# Patient Record
Sex: Female | Born: 1945 | Race: White | Hispanic: No | Smoking: Former smoker
Health system: Southern US, Community
[De-identification: ages and names within clinical notes are randomized; demographics above are authoritative.]

## PROBLEM LIST (undated history)

## (undated) DIAGNOSIS — I509 Heart failure, unspecified: Secondary | ICD-10-CM

## (undated) DIAGNOSIS — E0500 Thyrotoxicosis with diffuse goiter without thyrotoxic crisis or storm: Secondary | ICD-10-CM

## (undated) DIAGNOSIS — C801 Malignant (primary) neoplasm, unspecified: Secondary | ICD-10-CM

## (undated) DIAGNOSIS — M1990 Unspecified osteoarthritis, unspecified site: Secondary | ICD-10-CM

## (undated) DIAGNOSIS — K56609 Unspecified intestinal obstruction, unspecified as to partial versus complete obstruction: Secondary | ICD-10-CM

## (undated) DIAGNOSIS — K5790 Diverticulosis of intestine, part unspecified, without perforation or abscess without bleeding: Secondary | ICD-10-CM

## (undated) DIAGNOSIS — I779 Disorder of arteries and arterioles, unspecified: Secondary | ICD-10-CM

## (undated) DIAGNOSIS — E7800 Pure hypercholesterolemia, unspecified: Secondary | ICD-10-CM

## (undated) DIAGNOSIS — I340 Nonrheumatic mitral (valve) insufficiency: Secondary | ICD-10-CM

## (undated) DIAGNOSIS — I253 Aneurysm of heart: Secondary | ICD-10-CM

## (undated) DIAGNOSIS — E079 Disorder of thyroid, unspecified: Secondary | ICD-10-CM

## (undated) DIAGNOSIS — I2510 Atherosclerotic heart disease of native coronary artery without angina pectoris: Secondary | ICD-10-CM

## (undated) HISTORY — PX: BASAL CELL CARCINOMA EXCISION: SHX1214

## (undated) HISTORY — PX: TRIGGER FINGER RELEASE: SHX641

## (undated) HISTORY — DX: Unspecified intestinal obstruction, unspecified as to partial versus complete obstruction: K56.609

## (undated) HISTORY — PX: DILATION AND CURETTAGE OF UTERUS: SHX78

## (undated) HISTORY — DX: Diverticulosis of intestine, part unspecified, without perforation or abscess without bleeding: K57.90

## (undated) HISTORY — PX: CARPAL TUNNEL RELEASE: SHX101

## (undated) HISTORY — DX: Nonrheumatic mitral (valve) insufficiency: I34.0

## (undated) HISTORY — PX: TONSILLECTOMY: SUR1361

## (undated) HISTORY — DX: Aneurysm of heart: I25.3

## (undated) HISTORY — DX: Disorder of arteries and arterioles, unspecified: I77.9

## (undated) HISTORY — PX: SHOULDER ARTHROSCOPY W/ ROTATOR CUFF REPAIR: SHX2400

## (undated) HISTORY — PX: ANTERIOR CRUCIATE LIGAMENT REPAIR: SHX115

## (undated) HISTORY — PX: RIGHT OOPHORECTOMY: SHX2359

## (undated) HISTORY — DX: Heart failure, unspecified: I50.9

## (undated) HISTORY — PX: CARDIAC CATHETERIZATION: SHX172

## (undated) HISTORY — PX: CATARACT EXTRACTION W/ INTRAOCULAR LENS  IMPLANT, BILATERAL: SHX1307

---

## 1998-06-29 ENCOUNTER — Other Ambulatory Visit: Admission: RE | Admit: 1998-06-29 | Discharge: 1998-06-29 | Payer: Self-pay | Admitting: Gynecology

## 1999-08-20 ENCOUNTER — Encounter: Admission: RE | Admit: 1999-08-20 | Discharge: 1999-08-20 | Payer: Self-pay | Admitting: Gynecology

## 1999-08-20 ENCOUNTER — Other Ambulatory Visit: Admission: RE | Admit: 1999-08-20 | Discharge: 1999-08-20 | Payer: Self-pay | Admitting: Gynecology

## 1999-08-20 ENCOUNTER — Encounter: Payer: Self-pay | Admitting: Gynecology

## 2000-06-09 ENCOUNTER — Ambulatory Visit (HOSPITAL_BASED_OUTPATIENT_CLINIC_OR_DEPARTMENT_OTHER): Admission: RE | Admit: 2000-06-09 | Discharge: 2000-06-09 | Payer: Self-pay | Admitting: Orthopedic Surgery

## 2000-08-17 ENCOUNTER — Other Ambulatory Visit: Admission: RE | Admit: 2000-08-17 | Discharge: 2000-08-17 | Payer: Self-pay | Admitting: Gynecology

## 2001-06-03 ENCOUNTER — Other Ambulatory Visit: Admission: RE | Admit: 2001-06-03 | Discharge: 2001-06-03 | Payer: Self-pay | Admitting: Gynecology

## 2002-08-16 ENCOUNTER — Other Ambulatory Visit: Admission: RE | Admit: 2002-08-16 | Discharge: 2002-08-16 | Payer: Self-pay | Admitting: Gynecology

## 2002-08-16 ENCOUNTER — Encounter: Admission: RE | Admit: 2002-08-16 | Discharge: 2002-08-16 | Payer: Self-pay | Admitting: Gynecology

## 2002-08-16 ENCOUNTER — Encounter: Payer: Self-pay | Admitting: Gynecology

## 2003-04-13 ENCOUNTER — Ambulatory Visit (HOSPITAL_BASED_OUTPATIENT_CLINIC_OR_DEPARTMENT_OTHER): Admission: RE | Admit: 2003-04-13 | Discharge: 2003-04-13 | Payer: Self-pay | Admitting: Orthopedic Surgery

## 2005-08-13 ENCOUNTER — Ambulatory Visit (HOSPITAL_COMMUNITY): Admission: RE | Admit: 2005-08-13 | Discharge: 2005-08-13 | Payer: Self-pay | Admitting: Internal Medicine

## 2008-06-02 ENCOUNTER — Ambulatory Visit (HOSPITAL_BASED_OUTPATIENT_CLINIC_OR_DEPARTMENT_OTHER): Admission: RE | Admit: 2008-06-02 | Discharge: 2008-06-02 | Payer: Self-pay | Admitting: Orthopedic Surgery

## 2008-06-29 ENCOUNTER — Ambulatory Visit (HOSPITAL_BASED_OUTPATIENT_CLINIC_OR_DEPARTMENT_OTHER): Admission: RE | Admit: 2008-06-29 | Discharge: 2008-06-29 | Payer: Self-pay | Admitting: Orthopedic Surgery

## 2010-09-22 ENCOUNTER — Encounter
Admission: RE | Admit: 2010-09-22 | Discharge: 2010-09-22 | Payer: Self-pay | Source: Home / Self Care | Attending: Internal Medicine | Admitting: Internal Medicine

## 2011-01-14 NOTE — Op Note (Signed)
[name], [name]                 ACCOUNT NO.:  [id number]   MEDICAL RECORD NO.:  [id number]          PATIENT TYPE:  AMB   LOCATION:  DSC                          FACILITY:  [facility]   PHYSICIAN:  Cindee Salt, M.D.       DATE OF BIRTH:  [birth year]   DATE OF PROCEDURE:  06/02/2008  DATE OF DISCHARGE:                               OPERATIVE REPORT   PREOPERATIVE DIAGNOSES:  1. Stenosing tenosynovitis of right index, right middle, right ring,      and right little finger.  2. Carpal tunnel syndrome right hand.   POSTOPERATIVE DIAGNOSES:  1. Stenosing tenosynovitis of right index, right middle, right ring,      and right little finger.  2. Carpal tunnel syndrome right hand.   OPERATION:  1. Decompression of right median nerve.  2. Release of A1 pulley, right index, right middle, right ring, and      right little finger.   SURGEON:  Cindee Salt, MD   ASSISTANT:  Carolyne Fiscal RN   ANESTHESIA:  Charlesetta Ivory IV regional.   ANESTHESIOLOGIST:  Quita Skye. Krista Blue, MD.   HISTORY:  The patient is a 65 year old female with a history of  triggering of her right index, middle ring, and little fingers; carpal  tunnel syndrome; and EMG nerve conductions positive.  She has elected to  undergo decompression of the median nerve and the trigger fingers.  She  is aware of risks and complications including infection, recurrence  injury to arteries, nerves, tendons, incomplete relief of symptoms, and  dystrophy.  In the preoperative area, the patient is seen.  The  extremity was marked by both the patient and surgeon.  Antibiotic was  given.  Questions are again encouraged and answered.   PROCEDURE:  The patient was brought to the operating room where a  forearm-based IV regional anesthetic was carried out without difficulty.  She was prepped using DuraPrep, supine position, right arm free.  A time-  out was taken.  Following this, a longitudinal incision was made in the  palm and carried down through the  subcutaneous tissue.  Bleeders were  electrocauterized.  Palmar fascia was split.  Superficial palmar arch  was identified.  The flexor tendon to the ring and little finger was  identified to the ulnar side of median nerve.  Carpal retinaculum was  incised with sharp dissection.  Right angle and Sewall retractors were  placed between the skin and forearm fascia.  Fascia was released for  approximately 1.5 cm proximal to the wrist crease under direct vision.  Canal was explored.  The area of compression of the nerve was apparent.  No further lesions were identified.  The wound was irrigated.  Skin was  closed with interrupted 5-0 Vicryl Rapide sutures.  A separate incision  was then made over the index finger, oblique in nature and carried down  through the subcutaneous tissue.  Bleeders were electrocauterized.  Neurovascular structures were identified and protected.  Retractors were  placed.  The A1 pulley was released on its radial aspect.  A small  incision was made centrally  in the A2 pulley.  A separate incision was  then made obliquely on the middle finger and again carried down through  the subcutaneous tissue.  Retractors were placed.  Bleeders were  electrocauterized.  Neurovascular structures were protected.  An  incision made on the radial aspect of the A1 pulley of the middle finger  and a small incision was made in the A2.  The finger was placed through  a full range motion.  No further triggering was noted.  A similar  incision was made on the opposite direction in the line of the flexor  tendon on the ring and carried down through the subcutaneous tissue.  Again, neurovascular structures were protected.  The release to the A1  pulley was done at its radial aspect with a small incision made  centrally in the A2 pulley.  This finger was passed through a full range  motion with no further triggering.  The little finger was attended to  next with an oblique incision in line of  the flexor tendon and carried  down through the subcutaneous tissue.  Retractors were again placed.  Neurovascular structures were protected.  A release of the radial aspect  of the A1 pulley and central aspect of the A2 pulley was then made.  No  further triggering was noted.  The wounds were copiously irrigated with  saline and skin was closed with interrupted Vicryl Rapide sutures.  A  sterile compressive dressing with the fingers free was applied along  with a splint.  On deflation of the tourniquet, all fingers immediately  pinked.  She was taken to the recovery room for observation in  satisfactory condition.  She would be discharged home to return to the  [facility] of [place] in 1 week on Vicodin.           ______________________________  Cindee Salt, M.D.     GK/MEDQ  D:  06/02/2008  T:  06/03/2008  Job:  [id number]

## 2011-01-14 NOTE — Op Note (Signed)
NAMEMarland Kitchen  Deborah [name], Deborah [name]                 ACCOUNT NO.:  [id number]   MEDICAL RECORD NO.:  [id number]          PATIENT TYPE:  AMB   LOCATION:  DSC                          FACILITY:  [facility]   PHYSICIAN:  Cindee Salt, M.D.       DATE OF BIRTH:  06-14-1946   DATE OF PROCEDURE:  06/29/2008  DATE OF DISCHARGE:                               OPERATIVE REPORT   PREOPERATIVE DIAGNOSIS:  Stenosing tenosynovitis, left index, left  little finger.   POSTOPERATIVE DIAGNOSIS:  Stenosing tenosynovitis, left index, left  little finger.   OPERATION:  Release A1 pulley left index, left little finger.   SURGEON:  Cindee Salt, MD   ASSISTANT:  Carolyne Fiscal, RN   ANESTHESIA:  Forearm-based IV regional.   ANESTHESIOLOGIST:  Zenon Mayo, MD   HISTORY:  The patient is a 65 year old female with a history of  triggering of multiple fingers, most recently the left index, left  little.  She also has carpal tunnel and has elected not to have this  released at the same time along with mucoid cyst.  In the preoperative  area, the patient is seen, the extremity marked by both the patient and  surgeon, antibiotic given.  She is aware of risks and complications.  Having had the right side done, she is aware that there is no guarantee  with surgery; possibility of infection; recurrence; injury to arteries,  nerves, and tendons; complete relief of symptoms, and dystrophy.   She was taken to the operating room where a forearm-based IV regional  anesthetic was carried out under the direction of Dr. Sampson Goon.  She  was prepped using DuraPrep in supine position, left arm free.  A time-  out was taken.  Following this, an oblique incision was made over the A1  pulley of the left index finger, carried down through the subcutaneous  tissue.  Bleeders electrocauterized.  The neurovascular bundles  retracted.  An incision was then made on the radial aspect of the A1  pulley.  Releasing this, a small incision was made  centrally in the A2  pulley.  The finger placed through a full range of motion, no further  triggering was noted.  Similarly, an oblique incision was made over the  little finger A1 pulley, carried down through the subcutaneous tissue.  The pulley was identified.  This was released on its radial aspect.  No  further lesions were noted.  A small area of thickening of the  superficialis tendon was noted.  This was minimally debrided.  The A2  pulley central aspect was incised.  The finger placed through a full  range of motion, no further triggering was noted.  The wounds were  irrigated and closed with interrupted 5-0 Vicryl Rapide sutures.  Sterile compressive dressing was applied to the finger.  The patient  tolerated  the procedure well.  At deflation of the tourniquet, all fingers  immediately pinked.  She was taken to the recovery room for observation  in satisfactory condition.  She will be discharged home, to return to  the [facility] of [place] in  1 week, on Vicodin.           ______________________________  Cindee Salt, M.D.     GK/MEDQ  D:  06/29/2008  T:  06/30/2008  Job:  [id number]   cc:   Dr. Sherryll Burger

## 2011-01-17 NOTE — Op Note (Signed)
   NAME:  Deborah [name], Deborah [name]                           ACCOUNT NO.:  [id number]   MEDICAL RECORD NO.:  [id number]                   PATIENT TYPE:  AMB   LOCATION:  DSC                                  FACILITY:  [facility]   PHYSICIAN:  Cindee Salt, M.D.                    DATE OF BIRTH:  [birth year]   DATE OF PROCEDURE:  04/13/2003  DATE OF DISCHARGE:                                 OPERATIVE REPORT   PREOPERATIVE DIAGNOSIS:  Stenosing tenosynovitis left middle, left little  fingers.   POSTOPERATIVE DIAGNOSIS:  Stenosing tenosynovitis left middle, left little  fingers.   OPERATION:  Release A1 pulley left middle, left little finger.   SURGEON:  Cindee Salt, M.D.   ASSISTANTCarolyne Fiscal   ANESTHESIA:  Forearm base IV regional   HISTORY:  The patient is a 65 year old female with a history of triggering  of her left middle, left little fingers not responsive to conservative care.   PROCEDURE:  The patient was brought to the operating room where a forearm  base IV regional anesthetic was carried out without difficulty.  She was  prepped and draped using Duraprep in the supine position with the left arm  free.  An oblique incision was made over the A1 pulley of the little and of  the middle fingers, carried down through subcutaneous tissue.  Neurovascular  structures identified and protected with retractors.  The A1 pulley was  identified and significant synovitis was present proximally.  The A1 Pulley  was released and the synovitis was also released and removed.  The finger  was placed through a full range of motion, no further triggering of either  middle or little fingers was noted.  The wound was irrigated, the skin was  closed with interrupted 5-0 nylon sutures.  A sterile compressive dressing  was applied.  The patient tolerated the procedure well and was taken to the  recovery room for observation in satisfactory condition.  She is discharged  home to return to the [facility] of  [place] in one week.   MEDICATIONS:  Vicodin and Keflex.                                               Cindee Salt, M.D.    GK/MEDQ  D:  04/13/2003  T:  04/14/2003  Job:  [id number]

## 2011-06-03 LAB — POCT HEMOGLOBIN-HEMACUE
Hemoglobin: 12.5
Hemoglobin: 12.5

## 2011-09-05 DIAGNOSIS — E785 Hyperlipidemia, unspecified: Secondary | ICD-10-CM | POA: Diagnosis not present

## 2011-09-05 DIAGNOSIS — E0500 Thyrotoxicosis with diffuse goiter without thyrotoxic crisis or storm: Secondary | ICD-10-CM | POA: Diagnosis not present

## 2011-09-05 DIAGNOSIS — Z0000 Encounter for general adult medical examination without abnormal findings: Secondary | ICD-10-CM | POA: Diagnosis not present

## 2011-09-05 DIAGNOSIS — M949 Disorder of cartilage, unspecified: Secondary | ICD-10-CM | POA: Diagnosis not present

## 2011-09-05 DIAGNOSIS — M899 Disorder of bone, unspecified: Secondary | ICD-10-CM | POA: Diagnosis not present

## 2011-09-05 DIAGNOSIS — R82998 Other abnormal findings in urine: Secondary | ICD-10-CM | POA: Diagnosis not present

## 2011-09-11 DIAGNOSIS — Z79899 Other long term (current) drug therapy: Secondary | ICD-10-CM | POA: Diagnosis not present

## 2011-09-11 DIAGNOSIS — N390 Urinary tract infection, site not specified: Secondary | ICD-10-CM | POA: Diagnosis not present

## 2011-09-11 DIAGNOSIS — E785 Hyperlipidemia, unspecified: Secondary | ICD-10-CM | POA: Diagnosis not present

## 2011-09-11 DIAGNOSIS — Z1212 Encounter for screening for malignant neoplasm of rectum: Secondary | ICD-10-CM | POA: Diagnosis not present

## 2011-09-11 DIAGNOSIS — E0590 Thyrotoxicosis, unspecified without thyrotoxic crisis or storm: Secondary | ICD-10-CM | POA: Diagnosis not present

## 2011-09-11 DIAGNOSIS — R82998 Other abnormal findings in urine: Secondary | ICD-10-CM | POA: Diagnosis not present

## 2011-09-11 DIAGNOSIS — Z0000 Encounter for general adult medical examination without abnormal findings: Secondary | ICD-10-CM | POA: Diagnosis not present

## 2011-09-17 DIAGNOSIS — M999 Biomechanical lesion, unspecified: Secondary | ICD-10-CM | POA: Diagnosis not present

## 2011-09-17 DIAGNOSIS — M5137 Other intervertebral disc degeneration, lumbosacral region: Secondary | ICD-10-CM | POA: Diagnosis not present

## 2011-10-15 DIAGNOSIS — M5137 Other intervertebral disc degeneration, lumbosacral region: Secondary | ICD-10-CM | POA: Diagnosis not present

## 2011-10-15 DIAGNOSIS — M999 Biomechanical lesion, unspecified: Secondary | ICD-10-CM | POA: Diagnosis not present

## 2011-10-15 DIAGNOSIS — M9981 Other biomechanical lesions of cervical region: Secondary | ICD-10-CM | POA: Diagnosis not present

## 2011-12-09 DIAGNOSIS — E0590 Thyrotoxicosis, unspecified without thyrotoxic crisis or storm: Secondary | ICD-10-CM | POA: Diagnosis not present

## 2011-12-10 DIAGNOSIS — M545 Low back pain: Secondary | ICD-10-CM | POA: Diagnosis not present

## 2011-12-10 DIAGNOSIS — M999 Biomechanical lesion, unspecified: Secondary | ICD-10-CM | POA: Diagnosis not present

## 2011-12-10 DIAGNOSIS — M5137 Other intervertebral disc degeneration, lumbosacral region: Secondary | ICD-10-CM | POA: Diagnosis not present

## 2012-01-13 DIAGNOSIS — M999 Biomechanical lesion, unspecified: Secondary | ICD-10-CM | POA: Diagnosis not present

## 2012-01-13 DIAGNOSIS — M5137 Other intervertebral disc degeneration, lumbosacral region: Secondary | ICD-10-CM | POA: Diagnosis not present

## 2012-01-13 DIAGNOSIS — M545 Low back pain: Secondary | ICD-10-CM | POA: Diagnosis not present

## 2012-01-22 DIAGNOSIS — H2510 Age-related nuclear cataract, unspecified eye: Secondary | ICD-10-CM | POA: Diagnosis not present

## 2012-02-09 DIAGNOSIS — M9981 Other biomechanical lesions of cervical region: Secondary | ICD-10-CM | POA: Diagnosis not present

## 2012-02-09 DIAGNOSIS — M999 Biomechanical lesion, unspecified: Secondary | ICD-10-CM | POA: Diagnosis not present

## 2012-02-09 DIAGNOSIS — M5137 Other intervertebral disc degeneration, lumbosacral region: Secondary | ICD-10-CM | POA: Diagnosis not present

## 2012-02-16 DIAGNOSIS — M999 Biomechanical lesion, unspecified: Secondary | ICD-10-CM | POA: Diagnosis not present

## 2012-02-16 DIAGNOSIS — M9981 Other biomechanical lesions of cervical region: Secondary | ICD-10-CM | POA: Diagnosis not present

## 2012-02-16 DIAGNOSIS — M5137 Other intervertebral disc degeneration, lumbosacral region: Secondary | ICD-10-CM | POA: Diagnosis not present

## 2012-02-23 DIAGNOSIS — M5030 Other cervical disc degeneration, unspecified cervical region: Secondary | ICD-10-CM | POA: Diagnosis not present

## 2012-02-23 DIAGNOSIS — M9981 Other biomechanical lesions of cervical region: Secondary | ICD-10-CM | POA: Diagnosis not present

## 2012-03-08 DIAGNOSIS — M5137 Other intervertebral disc degeneration, lumbosacral region: Secondary | ICD-10-CM | POA: Diagnosis not present

## 2012-03-08 DIAGNOSIS — M999 Biomechanical lesion, unspecified: Secondary | ICD-10-CM | POA: Diagnosis not present

## 2012-03-25 DIAGNOSIS — M545 Low back pain: Secondary | ICD-10-CM | POA: Diagnosis not present

## 2012-03-25 DIAGNOSIS — M5137 Other intervertebral disc degeneration, lumbosacral region: Secondary | ICD-10-CM | POA: Diagnosis not present

## 2012-03-25 DIAGNOSIS — M999 Biomechanical lesion, unspecified: Secondary | ICD-10-CM | POA: Diagnosis not present

## 2012-03-31 DIAGNOSIS — S43429A Sprain of unspecified rotator cuff capsule, initial encounter: Secondary | ICD-10-CM | POA: Diagnosis not present

## 2012-03-31 DIAGNOSIS — M25519 Pain in unspecified shoulder: Secondary | ICD-10-CM | POA: Diagnosis not present

## 2012-03-31 DIAGNOSIS — M719 Bursopathy, unspecified: Secondary | ICD-10-CM | POA: Diagnosis not present

## 2012-04-08 DIAGNOSIS — M75120 Complete rotator cuff tear or rupture of unspecified shoulder, not specified as traumatic: Secondary | ICD-10-CM | POA: Diagnosis not present

## 2012-04-26 DIAGNOSIS — M75120 Complete rotator cuff tear or rupture of unspecified shoulder, not specified as traumatic: Secondary | ICD-10-CM | POA: Diagnosis not present

## 2012-05-04 DIAGNOSIS — G8918 Other acute postprocedural pain: Secondary | ICD-10-CM | POA: Diagnosis not present

## 2012-05-04 DIAGNOSIS — M719 Bursopathy, unspecified: Secondary | ICD-10-CM | POA: Diagnosis not present

## 2012-05-04 DIAGNOSIS — E785 Hyperlipidemia, unspecified: Secondary | ICD-10-CM | POA: Diagnosis not present

## 2012-05-04 DIAGNOSIS — M67919 Unspecified disorder of synovium and tendon, unspecified shoulder: Secondary | ICD-10-CM | POA: Diagnosis not present

## 2012-05-04 DIAGNOSIS — M75120 Complete rotator cuff tear or rupture of unspecified shoulder, not specified as traumatic: Secondary | ICD-10-CM | POA: Diagnosis not present

## 2012-05-04 DIAGNOSIS — M75110 Incomplete rotator cuff tear or rupture of unspecified shoulder, not specified as traumatic: Secondary | ICD-10-CM | POA: Diagnosis not present

## 2012-05-04 DIAGNOSIS — M19019 Primary osteoarthritis, unspecified shoulder: Secondary | ICD-10-CM | POA: Diagnosis not present

## 2012-05-04 DIAGNOSIS — E0590 Thyrotoxicosis, unspecified without thyrotoxic crisis or storm: Secondary | ICD-10-CM | POA: Diagnosis not present

## 2012-05-11 DIAGNOSIS — L570 Actinic keratosis: Secondary | ICD-10-CM | POA: Diagnosis not present

## 2012-05-11 DIAGNOSIS — L821 Other seborrheic keratosis: Secondary | ICD-10-CM | POA: Diagnosis not present

## 2012-05-11 DIAGNOSIS — L723 Sebaceous cyst: Secondary | ICD-10-CM | POA: Diagnosis not present

## 2012-05-18 DIAGNOSIS — Z23 Encounter for immunization: Secondary | ICD-10-CM | POA: Diagnosis not present

## 2012-06-15 DIAGNOSIS — E0590 Thyrotoxicosis, unspecified without thyrotoxic crisis or storm: Secondary | ICD-10-CM | POA: Diagnosis not present

## 2012-06-16 DIAGNOSIS — H259 Unspecified age-related cataract: Secondary | ICD-10-CM | POA: Diagnosis not present

## 2012-06-16 DIAGNOSIS — H01009 Unspecified blepharitis unspecified eye, unspecified eyelid: Secondary | ICD-10-CM | POA: Diagnosis not present

## 2012-06-16 DIAGNOSIS — H43819 Vitreous degeneration, unspecified eye: Secondary | ICD-10-CM | POA: Diagnosis not present

## 2012-06-16 DIAGNOSIS — Z9889 Other specified postprocedural states: Secondary | ICD-10-CM | POA: Diagnosis not present

## 2012-06-16 DIAGNOSIS — H33009 Unspecified retinal detachment with retinal break, unspecified eye: Secondary | ICD-10-CM | POA: Diagnosis not present

## 2012-06-16 DIAGNOSIS — Z09 Encounter for follow-up examination after completed treatment for conditions other than malignant neoplasm: Secondary | ICD-10-CM | POA: Diagnosis not present

## 2012-07-07 DIAGNOSIS — M545 Low back pain: Secondary | ICD-10-CM | POA: Diagnosis not present

## 2012-07-07 DIAGNOSIS — M999 Biomechanical lesion, unspecified: Secondary | ICD-10-CM | POA: Diagnosis not present

## 2012-07-07 DIAGNOSIS — M5137 Other intervertebral disc degeneration, lumbosacral region: Secondary | ICD-10-CM | POA: Diagnosis not present

## 2012-07-20 DIAGNOSIS — M5137 Other intervertebral disc degeneration, lumbosacral region: Secondary | ICD-10-CM | POA: Diagnosis not present

## 2012-07-20 DIAGNOSIS — M545 Low back pain: Secondary | ICD-10-CM | POA: Diagnosis not present

## 2012-07-20 DIAGNOSIS — M999 Biomechanical lesion, unspecified: Secondary | ICD-10-CM | POA: Diagnosis not present

## 2012-09-06 DIAGNOSIS — IMO0001 Reserved for inherently not codable concepts without codable children: Secondary | ICD-10-CM | POA: Diagnosis not present

## 2012-09-06 DIAGNOSIS — M25519 Pain in unspecified shoulder: Secondary | ICD-10-CM | POA: Diagnosis not present

## 2012-09-07 DIAGNOSIS — H2589 Other age-related cataract: Secondary | ICD-10-CM | POA: Diagnosis not present

## 2012-09-09 DIAGNOSIS — Z1231 Encounter for screening mammogram for malignant neoplasm of breast: Secondary | ICD-10-CM | POA: Diagnosis not present

## 2012-10-04 DIAGNOSIS — Z4789 Encounter for other orthopedic aftercare: Secondary | ICD-10-CM | POA: Diagnosis not present

## 2012-10-04 DIAGNOSIS — M25569 Pain in unspecified knee: Secondary | ICD-10-CM | POA: Diagnosis not present

## 2012-10-04 DIAGNOSIS — S43429A Sprain of unspecified rotator cuff capsule, initial encounter: Secondary | ICD-10-CM | POA: Diagnosis not present

## 2012-10-05 DIAGNOSIS — S43429A Sprain of unspecified rotator cuff capsule, initial encounter: Secondary | ICD-10-CM | POA: Diagnosis not present

## 2012-10-05 DIAGNOSIS — M67919 Unspecified disorder of synovium and tendon, unspecified shoulder: Secondary | ICD-10-CM | POA: Diagnosis not present

## 2012-10-05 DIAGNOSIS — M9981 Other biomechanical lesions of cervical region: Secondary | ICD-10-CM | POA: Diagnosis not present

## 2012-10-05 DIAGNOSIS — M19019 Primary osteoarthritis, unspecified shoulder: Secondary | ICD-10-CM | POA: Diagnosis not present

## 2012-10-05 DIAGNOSIS — M5030 Other cervical disc degeneration, unspecified cervical region: Secondary | ICD-10-CM | POA: Diagnosis not present

## 2012-10-05 DIAGNOSIS — M75100 Unspecified rotator cuff tear or rupture of unspecified shoulder, not specified as traumatic: Secondary | ICD-10-CM | POA: Diagnosis not present

## 2012-10-05 DIAGNOSIS — M531 Cervicobrachial syndrome: Secondary | ICD-10-CM | POA: Diagnosis not present

## 2012-10-18 DIAGNOSIS — Z4789 Encounter for other orthopedic aftercare: Secondary | ICD-10-CM | POA: Diagnosis not present

## 2012-10-18 DIAGNOSIS — S43429A Sprain of unspecified rotator cuff capsule, initial encounter: Secondary | ICD-10-CM | POA: Diagnosis not present

## 2012-10-27 DIAGNOSIS — M9981 Other biomechanical lesions of cervical region: Secondary | ICD-10-CM | POA: Diagnosis not present

## 2012-10-27 DIAGNOSIS — M5412 Radiculopathy, cervical region: Secondary | ICD-10-CM | POA: Diagnosis not present

## 2012-10-27 DIAGNOSIS — I498 Other specified cardiac arrhythmias: Secondary | ICD-10-CM | POA: Diagnosis not present

## 2012-10-27 DIAGNOSIS — M5030 Other cervical disc degeneration, unspecified cervical region: Secondary | ICD-10-CM | POA: Diagnosis not present

## 2012-10-27 DIAGNOSIS — E0590 Thyrotoxicosis, unspecified without thyrotoxic crisis or storm: Secondary | ICD-10-CM | POA: Diagnosis not present

## 2012-10-29 DIAGNOSIS — S46819A Strain of other muscles, fascia and tendons at shoulder and upper arm level, unspecified arm, initial encounter: Secondary | ICD-10-CM | POA: Diagnosis not present

## 2012-10-29 DIAGNOSIS — M75120 Complete rotator cuff tear or rupture of unspecified shoulder, not specified as traumatic: Secondary | ICD-10-CM | POA: Diagnosis not present

## 2012-10-29 DIAGNOSIS — S43429A Sprain of unspecified rotator cuff capsule, initial encounter: Secondary | ICD-10-CM | POA: Diagnosis not present

## 2012-11-11 DIAGNOSIS — M949 Disorder of cartilage, unspecified: Secondary | ICD-10-CM | POA: Diagnosis not present

## 2012-11-11 DIAGNOSIS — E785 Hyperlipidemia, unspecified: Secondary | ICD-10-CM | POA: Diagnosis not present

## 2012-11-11 DIAGNOSIS — E0590 Thyrotoxicosis, unspecified without thyrotoxic crisis or storm: Secondary | ICD-10-CM | POA: Diagnosis not present

## 2012-11-15 DIAGNOSIS — M25519 Pain in unspecified shoulder: Secondary | ICD-10-CM | POA: Diagnosis not present

## 2012-11-15 DIAGNOSIS — Z4789 Encounter for other orthopedic aftercare: Secondary | ICD-10-CM | POA: Diagnosis not present

## 2012-11-17 DIAGNOSIS — M25619 Stiffness of unspecified shoulder, not elsewhere classified: Secondary | ICD-10-CM | POA: Diagnosis not present

## 2012-11-17 DIAGNOSIS — M6281 Muscle weakness (generalized): Secondary | ICD-10-CM | POA: Diagnosis not present

## 2012-11-17 DIAGNOSIS — M75120 Complete rotator cuff tear or rupture of unspecified shoulder, not specified as traumatic: Secondary | ICD-10-CM | POA: Diagnosis not present

## 2012-11-18 DIAGNOSIS — Z1331 Encounter for screening for depression: Secondary | ICD-10-CM | POA: Diagnosis not present

## 2012-11-18 DIAGNOSIS — E0590 Thyrotoxicosis, unspecified without thyrotoxic crisis or storm: Secondary | ICD-10-CM | POA: Diagnosis not present

## 2012-11-18 DIAGNOSIS — M899 Disorder of bone, unspecified: Secondary | ICD-10-CM | POA: Diagnosis not present

## 2012-11-18 DIAGNOSIS — Z0000 Encounter for general adult medical examination without abnormal findings: Secondary | ICD-10-CM | POA: Diagnosis not present

## 2012-11-18 DIAGNOSIS — M949 Disorder of cartilage, unspecified: Secondary | ICD-10-CM | POA: Diagnosis not present

## 2012-11-18 DIAGNOSIS — E785 Hyperlipidemia, unspecified: Secondary | ICD-10-CM | POA: Diagnosis not present

## 2012-11-18 DIAGNOSIS — E0500 Thyrotoxicosis with diffuse goiter without thyrotoxic crisis or storm: Secondary | ICD-10-CM | POA: Diagnosis not present

## 2012-11-18 DIAGNOSIS — F17200 Nicotine dependence, unspecified, uncomplicated: Secondary | ICD-10-CM | POA: Diagnosis not present

## 2012-11-18 DIAGNOSIS — R0989 Other specified symptoms and signs involving the circulatory and respiratory systems: Secondary | ICD-10-CM | POA: Diagnosis not present

## 2012-11-18 DIAGNOSIS — IMO0002 Reserved for concepts with insufficient information to code with codable children: Secondary | ICD-10-CM | POA: Diagnosis not present

## 2012-11-19 DIAGNOSIS — M25619 Stiffness of unspecified shoulder, not elsewhere classified: Secondary | ICD-10-CM | POA: Diagnosis not present

## 2012-11-19 DIAGNOSIS — M25519 Pain in unspecified shoulder: Secondary | ICD-10-CM | POA: Diagnosis not present

## 2012-11-19 DIAGNOSIS — M75120 Complete rotator cuff tear or rupture of unspecified shoulder, not specified as traumatic: Secondary | ICD-10-CM | POA: Diagnosis not present

## 2012-11-19 DIAGNOSIS — M6281 Muscle weakness (generalized): Secondary | ICD-10-CM | POA: Diagnosis not present

## 2012-11-23 DIAGNOSIS — E0590 Thyrotoxicosis, unspecified without thyrotoxic crisis or storm: Secondary | ICD-10-CM | POA: Diagnosis not present

## 2012-11-23 DIAGNOSIS — Z1212 Encounter for screening for malignant neoplasm of rectum: Secondary | ICD-10-CM | POA: Diagnosis not present

## 2012-11-24 DIAGNOSIS — M75120 Complete rotator cuff tear or rupture of unspecified shoulder, not specified as traumatic: Secondary | ICD-10-CM | POA: Diagnosis not present

## 2012-11-24 DIAGNOSIS — M25619 Stiffness of unspecified shoulder, not elsewhere classified: Secondary | ICD-10-CM | POA: Diagnosis not present

## 2012-11-24 DIAGNOSIS — M6281 Muscle weakness (generalized): Secondary | ICD-10-CM | POA: Diagnosis not present

## 2012-11-26 DIAGNOSIS — M25619 Stiffness of unspecified shoulder, not elsewhere classified: Secondary | ICD-10-CM | POA: Diagnosis not present

## 2012-11-26 DIAGNOSIS — M75120 Complete rotator cuff tear or rupture of unspecified shoulder, not specified as traumatic: Secondary | ICD-10-CM | POA: Diagnosis not present

## 2012-11-26 DIAGNOSIS — M6281 Muscle weakness (generalized): Secondary | ICD-10-CM | POA: Diagnosis not present

## 2012-12-01 DIAGNOSIS — M6281 Muscle weakness (generalized): Secondary | ICD-10-CM | POA: Diagnosis not present

## 2012-12-01 DIAGNOSIS — M75120 Complete rotator cuff tear or rupture of unspecified shoulder, not specified as traumatic: Secondary | ICD-10-CM | POA: Diagnosis not present

## 2012-12-01 DIAGNOSIS — M25619 Stiffness of unspecified shoulder, not elsewhere classified: Secondary | ICD-10-CM | POA: Diagnosis not present

## 2012-12-03 DIAGNOSIS — M6281 Muscle weakness (generalized): Secondary | ICD-10-CM | POA: Diagnosis not present

## 2012-12-03 DIAGNOSIS — Z23 Encounter for immunization: Secondary | ICD-10-CM | POA: Diagnosis not present

## 2012-12-03 DIAGNOSIS — M25619 Stiffness of unspecified shoulder, not elsewhere classified: Secondary | ICD-10-CM | POA: Diagnosis not present

## 2012-12-03 DIAGNOSIS — M75120 Complete rotator cuff tear or rupture of unspecified shoulder, not specified as traumatic: Secondary | ICD-10-CM | POA: Diagnosis not present

## 2012-12-08 DIAGNOSIS — M25619 Stiffness of unspecified shoulder, not elsewhere classified: Secondary | ICD-10-CM | POA: Diagnosis not present

## 2012-12-08 DIAGNOSIS — M75120 Complete rotator cuff tear or rupture of unspecified shoulder, not specified as traumatic: Secondary | ICD-10-CM | POA: Diagnosis not present

## 2012-12-08 DIAGNOSIS — M6281 Muscle weakness (generalized): Secondary | ICD-10-CM | POA: Diagnosis not present

## 2012-12-10 DIAGNOSIS — M75120 Complete rotator cuff tear or rupture of unspecified shoulder, not specified as traumatic: Secondary | ICD-10-CM | POA: Diagnosis not present

## 2012-12-10 DIAGNOSIS — M6281 Muscle weakness (generalized): Secondary | ICD-10-CM | POA: Diagnosis not present

## 2012-12-10 DIAGNOSIS — M25619 Stiffness of unspecified shoulder, not elsewhere classified: Secondary | ICD-10-CM | POA: Diagnosis not present

## 2012-12-13 DIAGNOSIS — Z4789 Encounter for other orthopedic aftercare: Secondary | ICD-10-CM | POA: Diagnosis not present

## 2012-12-14 DIAGNOSIS — M899 Disorder of bone, unspecified: Secondary | ICD-10-CM | POA: Diagnosis not present

## 2012-12-15 DIAGNOSIS — M25619 Stiffness of unspecified shoulder, not elsewhere classified: Secondary | ICD-10-CM | POA: Diagnosis not present

## 2012-12-15 DIAGNOSIS — M6281 Muscle weakness (generalized): Secondary | ICD-10-CM | POA: Diagnosis not present

## 2012-12-15 DIAGNOSIS — M75120 Complete rotator cuff tear or rupture of unspecified shoulder, not specified as traumatic: Secondary | ICD-10-CM | POA: Diagnosis not present

## 2013-01-14 DIAGNOSIS — M75120 Complete rotator cuff tear or rupture of unspecified shoulder, not specified as traumatic: Secondary | ICD-10-CM | POA: Diagnosis not present

## 2013-01-14 DIAGNOSIS — M25619 Stiffness of unspecified shoulder, not elsewhere classified: Secondary | ICD-10-CM | POA: Diagnosis not present

## 2013-01-14 DIAGNOSIS — M6281 Muscle weakness (generalized): Secondary | ICD-10-CM | POA: Diagnosis not present

## 2013-01-17 DIAGNOSIS — H2589 Other age-related cataract: Secondary | ICD-10-CM | POA: Diagnosis not present

## 2013-01-17 DIAGNOSIS — M9981 Other biomechanical lesions of cervical region: Secondary | ICD-10-CM | POA: Diagnosis not present

## 2013-01-17 DIAGNOSIS — M5412 Radiculopathy, cervical region: Secondary | ICD-10-CM | POA: Diagnosis not present

## 2013-01-17 DIAGNOSIS — M5030 Other cervical disc degeneration, unspecified cervical region: Secondary | ICD-10-CM | POA: Diagnosis not present

## 2013-01-17 DIAGNOSIS — M25519 Pain in unspecified shoulder: Secondary | ICD-10-CM | POA: Diagnosis not present

## 2013-01-19 DIAGNOSIS — M6281 Muscle weakness (generalized): Secondary | ICD-10-CM | POA: Diagnosis not present

## 2013-01-19 DIAGNOSIS — M25619 Stiffness of unspecified shoulder, not elsewhere classified: Secondary | ICD-10-CM | POA: Diagnosis not present

## 2013-01-19 DIAGNOSIS — M75120 Complete rotator cuff tear or rupture of unspecified shoulder, not specified as traumatic: Secondary | ICD-10-CM | POA: Diagnosis not present

## 2013-01-26 DIAGNOSIS — M75120 Complete rotator cuff tear or rupture of unspecified shoulder, not specified as traumatic: Secondary | ICD-10-CM | POA: Diagnosis not present

## 2013-01-26 DIAGNOSIS — M6281 Muscle weakness (generalized): Secondary | ICD-10-CM | POA: Diagnosis not present

## 2013-01-26 DIAGNOSIS — M25619 Stiffness of unspecified shoulder, not elsewhere classified: Secondary | ICD-10-CM | POA: Diagnosis not present

## 2013-02-18 DIAGNOSIS — M75120 Complete rotator cuff tear or rupture of unspecified shoulder, not specified as traumatic: Secondary | ICD-10-CM | POA: Diagnosis not present

## 2013-02-18 DIAGNOSIS — M25619 Stiffness of unspecified shoulder, not elsewhere classified: Secondary | ICD-10-CM | POA: Diagnosis not present

## 2013-02-18 DIAGNOSIS — M6281 Muscle weakness (generalized): Secondary | ICD-10-CM | POA: Diagnosis not present

## 2013-02-21 DIAGNOSIS — M25519 Pain in unspecified shoulder: Secondary | ICD-10-CM | POA: Diagnosis not present

## 2013-02-21 DIAGNOSIS — S43429A Sprain of unspecified rotator cuff capsule, initial encounter: Secondary | ICD-10-CM | POA: Diagnosis not present

## 2013-02-21 DIAGNOSIS — Z4789 Encounter for other orthopedic aftercare: Secondary | ICD-10-CM | POA: Diagnosis not present

## 2013-02-22 DIAGNOSIS — M9981 Other biomechanical lesions of cervical region: Secondary | ICD-10-CM | POA: Diagnosis not present

## 2013-02-22 DIAGNOSIS — M5030 Other cervical disc degeneration, unspecified cervical region: Secondary | ICD-10-CM | POA: Diagnosis not present

## 2013-03-15 DIAGNOSIS — H2510 Age-related nuclear cataract, unspecified eye: Secondary | ICD-10-CM | POA: Diagnosis not present

## 2013-03-28 DIAGNOSIS — M5030 Other cervical disc degeneration, unspecified cervical region: Secondary | ICD-10-CM | POA: Diagnosis not present

## 2013-03-28 DIAGNOSIS — M9981 Other biomechanical lesions of cervical region: Secondary | ICD-10-CM | POA: Diagnosis not present

## 2013-04-26 DIAGNOSIS — M5030 Other cervical disc degeneration, unspecified cervical region: Secondary | ICD-10-CM | POA: Diagnosis not present

## 2013-04-26 DIAGNOSIS — M9981 Other biomechanical lesions of cervical region: Secondary | ICD-10-CM | POA: Diagnosis not present

## 2013-05-10 DIAGNOSIS — L821 Other seborrheic keratosis: Secondary | ICD-10-CM | POA: Diagnosis not present

## 2013-05-10 DIAGNOSIS — Z85828 Personal history of other malignant neoplasm of skin: Secondary | ICD-10-CM | POA: Diagnosis not present

## 2013-05-10 DIAGNOSIS — L570 Actinic keratosis: Secondary | ICD-10-CM | POA: Diagnosis not present

## 2013-05-10 DIAGNOSIS — E0590 Thyrotoxicosis, unspecified without thyrotoxic crisis or storm: Secondary | ICD-10-CM | POA: Diagnosis not present

## 2013-05-24 DIAGNOSIS — Z23 Encounter for immunization: Secondary | ICD-10-CM | POA: Diagnosis not present

## 2013-06-07 DIAGNOSIS — H18419 Arcus senilis, unspecified eye: Secondary | ICD-10-CM | POA: Diagnosis not present

## 2013-06-07 DIAGNOSIS — H25049 Posterior subcapsular polar age-related cataract, unspecified eye: Secondary | ICD-10-CM | POA: Diagnosis not present

## 2013-06-07 DIAGNOSIS — H2510 Age-related nuclear cataract, unspecified eye: Secondary | ICD-10-CM | POA: Diagnosis not present

## 2013-06-07 DIAGNOSIS — H02839 Dermatochalasis of unspecified eye, unspecified eyelid: Secondary | ICD-10-CM | POA: Diagnosis not present

## 2013-06-07 DIAGNOSIS — M9981 Other biomechanical lesions of cervical region: Secondary | ICD-10-CM | POA: Diagnosis not present

## 2013-06-07 DIAGNOSIS — H43399 Other vitreous opacities, unspecified eye: Secondary | ICD-10-CM | POA: Diagnosis not present

## 2013-06-07 DIAGNOSIS — M5030 Other cervical disc degeneration, unspecified cervical region: Secondary | ICD-10-CM | POA: Diagnosis not present

## 2013-06-07 DIAGNOSIS — H25019 Cortical age-related cataract, unspecified eye: Secondary | ICD-10-CM | POA: Diagnosis not present

## 2013-07-05 DIAGNOSIS — M5030 Other cervical disc degeneration, unspecified cervical region: Secondary | ICD-10-CM | POA: Diagnosis not present

## 2013-07-05 DIAGNOSIS — M9981 Other biomechanical lesions of cervical region: Secondary | ICD-10-CM | POA: Diagnosis not present

## 2013-07-25 DIAGNOSIS — M25519 Pain in unspecified shoulder: Secondary | ICD-10-CM | POA: Diagnosis not present

## 2013-07-25 DIAGNOSIS — M5030 Other cervical disc degeneration, unspecified cervical region: Secondary | ICD-10-CM | POA: Diagnosis not present

## 2013-07-25 DIAGNOSIS — M9981 Other biomechanical lesions of cervical region: Secondary | ICD-10-CM | POA: Diagnosis not present

## 2013-07-25 DIAGNOSIS — S43429A Sprain of unspecified rotator cuff capsule, initial encounter: Secondary | ICD-10-CM | POA: Diagnosis not present

## 2013-08-08 DIAGNOSIS — M67919 Unspecified disorder of synovium and tendon, unspecified shoulder: Secondary | ICD-10-CM | POA: Diagnosis not present

## 2013-08-15 DIAGNOSIS — H2510 Age-related nuclear cataract, unspecified eye: Secondary | ICD-10-CM | POA: Diagnosis not present

## 2013-08-15 DIAGNOSIS — H269 Unspecified cataract: Secondary | ICD-10-CM | POA: Diagnosis not present

## 2013-08-22 DIAGNOSIS — S43429A Sprain of unspecified rotator cuff capsule, initial encounter: Secondary | ICD-10-CM | POA: Diagnosis not present

## 2013-08-22 DIAGNOSIS — Z9889 Other specified postprocedural states: Secondary | ICD-10-CM | POA: Diagnosis not present

## 2013-08-23 DIAGNOSIS — M9981 Other biomechanical lesions of cervical region: Secondary | ICD-10-CM | POA: Diagnosis not present

## 2013-08-23 DIAGNOSIS — M5030 Other cervical disc degeneration, unspecified cervical region: Secondary | ICD-10-CM | POA: Diagnosis not present

## 2013-08-29 DIAGNOSIS — H2510 Age-related nuclear cataract, unspecified eye: Secondary | ICD-10-CM | POA: Diagnosis not present

## 2013-08-29 DIAGNOSIS — H269 Unspecified cataract: Secondary | ICD-10-CM | POA: Diagnosis not present

## 2013-09-27 DIAGNOSIS — M9981 Other biomechanical lesions of cervical region: Secondary | ICD-10-CM | POA: Diagnosis not present

## 2013-09-27 DIAGNOSIS — M5030 Other cervical disc degeneration, unspecified cervical region: Secondary | ICD-10-CM | POA: Diagnosis not present

## 2013-09-28 DIAGNOSIS — Z1231 Encounter for screening mammogram for malignant neoplasm of breast: Secondary | ICD-10-CM | POA: Diagnosis not present

## 2013-11-28 DIAGNOSIS — Z4789 Encounter for other orthopedic aftercare: Secondary | ICD-10-CM | POA: Diagnosis not present

## 2013-11-28 DIAGNOSIS — Z9889 Other specified postprocedural states: Secondary | ICD-10-CM | POA: Diagnosis not present

## 2013-11-28 DIAGNOSIS — M25519 Pain in unspecified shoulder: Secondary | ICD-10-CM | POA: Diagnosis not present

## 2013-11-29 DIAGNOSIS — M9981 Other biomechanical lesions of cervical region: Secondary | ICD-10-CM | POA: Diagnosis not present

## 2013-11-29 DIAGNOSIS — M5030 Other cervical disc degeneration, unspecified cervical region: Secondary | ICD-10-CM | POA: Diagnosis not present

## 2013-12-06 DIAGNOSIS — M899 Disorder of bone, unspecified: Secondary | ICD-10-CM | POA: Diagnosis not present

## 2013-12-06 DIAGNOSIS — M949 Disorder of cartilage, unspecified: Secondary | ICD-10-CM | POA: Diagnosis not present

## 2013-12-06 DIAGNOSIS — E0590 Thyrotoxicosis, unspecified without thyrotoxic crisis or storm: Secondary | ICD-10-CM | POA: Diagnosis not present

## 2013-12-06 DIAGNOSIS — R82998 Other abnormal findings in urine: Secondary | ICD-10-CM | POA: Diagnosis not present

## 2013-12-06 DIAGNOSIS — E785 Hyperlipidemia, unspecified: Secondary | ICD-10-CM | POA: Diagnosis not present

## 2013-12-06 DIAGNOSIS — E0500 Thyrotoxicosis with diffuse goiter without thyrotoxic crisis or storm: Secondary | ICD-10-CM | POA: Diagnosis not present

## 2013-12-13 DIAGNOSIS — Z1331 Encounter for screening for depression: Secondary | ICD-10-CM | POA: Diagnosis not present

## 2013-12-13 DIAGNOSIS — Z0000 Encounter for general adult medical examination without abnormal findings: Secondary | ICD-10-CM | POA: Diagnosis not present

## 2013-12-13 DIAGNOSIS — F17200 Nicotine dependence, unspecified, uncomplicated: Secondary | ICD-10-CM | POA: Diagnosis not present

## 2013-12-13 DIAGNOSIS — E0500 Thyrotoxicosis with diffuse goiter without thyrotoxic crisis or storm: Secondary | ICD-10-CM | POA: Diagnosis not present

## 2013-12-13 DIAGNOSIS — Z23 Encounter for immunization: Secondary | ICD-10-CM | POA: Diagnosis not present

## 2013-12-13 DIAGNOSIS — E785 Hyperlipidemia, unspecified: Secondary | ICD-10-CM | POA: Diagnosis not present

## 2013-12-13 DIAGNOSIS — M899 Disorder of bone, unspecified: Secondary | ICD-10-CM | POA: Diagnosis not present

## 2013-12-13 DIAGNOSIS — E0590 Thyrotoxicosis, unspecified without thyrotoxic crisis or storm: Secondary | ICD-10-CM | POA: Diagnosis not present

## 2013-12-13 DIAGNOSIS — M949 Disorder of cartilage, unspecified: Secondary | ICD-10-CM | POA: Diagnosis not present

## 2013-12-13 DIAGNOSIS — M67919 Unspecified disorder of synovium and tendon, unspecified shoulder: Secondary | ICD-10-CM | POA: Diagnosis not present

## 2013-12-19 DIAGNOSIS — M5030 Other cervical disc degeneration, unspecified cervical region: Secondary | ICD-10-CM | POA: Diagnosis not present

## 2013-12-19 DIAGNOSIS — M9981 Other biomechanical lesions of cervical region: Secondary | ICD-10-CM | POA: Diagnosis not present

## 2014-01-03 DIAGNOSIS — M9981 Other biomechanical lesions of cervical region: Secondary | ICD-10-CM | POA: Diagnosis not present

## 2014-01-03 DIAGNOSIS — M5030 Other cervical disc degeneration, unspecified cervical region: Secondary | ICD-10-CM | POA: Diagnosis not present

## 2014-01-31 DIAGNOSIS — M9981 Other biomechanical lesions of cervical region: Secondary | ICD-10-CM | POA: Diagnosis not present

## 2014-01-31 DIAGNOSIS — M5030 Other cervical disc degeneration, unspecified cervical region: Secondary | ICD-10-CM | POA: Diagnosis not present

## 2014-02-27 DIAGNOSIS — Z4789 Encounter for other orthopedic aftercare: Secondary | ICD-10-CM | POA: Diagnosis not present

## 2014-02-27 DIAGNOSIS — M25519 Pain in unspecified shoulder: Secondary | ICD-10-CM | POA: Diagnosis not present

## 2014-03-07 DIAGNOSIS — M9981 Other biomechanical lesions of cervical region: Secondary | ICD-10-CM | POA: Diagnosis not present

## 2014-03-07 DIAGNOSIS — M5030 Other cervical disc degeneration, unspecified cervical region: Secondary | ICD-10-CM | POA: Diagnosis not present

## 2014-04-17 DIAGNOSIS — L821 Other seborrheic keratosis: Secondary | ICD-10-CM | POA: Diagnosis not present

## 2014-04-17 DIAGNOSIS — L723 Sebaceous cyst: Secondary | ICD-10-CM | POA: Diagnosis not present

## 2014-04-17 DIAGNOSIS — Z85828 Personal history of other malignant neoplasm of skin: Secondary | ICD-10-CM | POA: Diagnosis not present

## 2014-04-17 DIAGNOSIS — L819 Disorder of pigmentation, unspecified: Secondary | ICD-10-CM | POA: Diagnosis not present

## 2014-04-17 DIAGNOSIS — L570 Actinic keratosis: Secondary | ICD-10-CM | POA: Diagnosis not present

## 2014-06-06 DIAGNOSIS — S134XXA Sprain of ligaments of cervical spine, initial encounter: Secondary | ICD-10-CM | POA: Diagnosis not present

## 2014-06-06 DIAGNOSIS — M5412 Radiculopathy, cervical region: Secondary | ICD-10-CM | POA: Diagnosis not present

## 2014-06-06 DIAGNOSIS — S13160A Subluxation of C5/C6 cervical vertebrae, initial encounter: Secondary | ICD-10-CM | POA: Diagnosis not present

## 2014-06-06 DIAGNOSIS — M5032 Other cervical disc degeneration, mid-cervical region: Secondary | ICD-10-CM | POA: Diagnosis not present

## 2014-06-13 DIAGNOSIS — E0590 Thyrotoxicosis, unspecified without thyrotoxic crisis or storm: Secondary | ICD-10-CM | POA: Diagnosis not present

## 2014-06-13 DIAGNOSIS — Z23 Encounter for immunization: Secondary | ICD-10-CM | POA: Diagnosis not present

## 2014-07-03 DIAGNOSIS — M9901 Segmental and somatic dysfunction of cervical region: Secondary | ICD-10-CM | POA: Diagnosis not present

## 2014-07-03 DIAGNOSIS — M5032 Other cervical disc degeneration, mid-cervical region: Secondary | ICD-10-CM | POA: Diagnosis not present

## 2014-07-03 DIAGNOSIS — S13160A Subluxation of C5/C6 cervical vertebrae, initial encounter: Secondary | ICD-10-CM | POA: Diagnosis not present

## 2014-07-03 DIAGNOSIS — M5412 Radiculopathy, cervical region: Secondary | ICD-10-CM | POA: Diagnosis not present

## 2014-08-01 DIAGNOSIS — M9901 Segmental and somatic dysfunction of cervical region: Secondary | ICD-10-CM | POA: Diagnosis not present

## 2014-08-01 DIAGNOSIS — M531 Cervicobrachial syndrome: Secondary | ICD-10-CM | POA: Diagnosis not present

## 2014-08-01 DIAGNOSIS — M5032 Other cervical disc degeneration, mid-cervical region: Secondary | ICD-10-CM | POA: Diagnosis not present

## 2014-09-05 DIAGNOSIS — M5032 Other cervical disc degeneration, mid-cervical region: Secondary | ICD-10-CM | POA: Diagnosis not present

## 2014-09-05 DIAGNOSIS — M9901 Segmental and somatic dysfunction of cervical region: Secondary | ICD-10-CM | POA: Diagnosis not present

## 2014-09-05 DIAGNOSIS — M5412 Radiculopathy, cervical region: Secondary | ICD-10-CM | POA: Diagnosis not present

## 2014-10-03 DIAGNOSIS — M5032 Other cervical disc degeneration, mid-cervical region: Secondary | ICD-10-CM | POA: Diagnosis not present

## 2014-10-03 DIAGNOSIS — Z961 Presence of intraocular lens: Secondary | ICD-10-CM | POA: Diagnosis not present

## 2014-10-03 DIAGNOSIS — H3531 Nonexudative age-related macular degeneration: Secondary | ICD-10-CM | POA: Diagnosis not present

## 2014-10-03 DIAGNOSIS — H43819 Vitreous degeneration, unspecified eye: Secondary | ICD-10-CM | POA: Diagnosis not present

## 2014-10-03 DIAGNOSIS — M5412 Radiculopathy, cervical region: Secondary | ICD-10-CM | POA: Diagnosis not present

## 2014-10-03 DIAGNOSIS — M9901 Segmental and somatic dysfunction of cervical region: Secondary | ICD-10-CM | POA: Diagnosis not present

## 2014-11-09 DIAGNOSIS — S13160A Subluxation of C5/C6 cervical vertebrae, initial encounter: Secondary | ICD-10-CM | POA: Diagnosis not present

## 2014-11-09 DIAGNOSIS — M5412 Radiculopathy, cervical region: Secondary | ICD-10-CM | POA: Diagnosis not present

## 2014-11-09 DIAGNOSIS — M9901 Segmental and somatic dysfunction of cervical region: Secondary | ICD-10-CM | POA: Diagnosis not present

## 2014-12-04 DIAGNOSIS — S13170A Subluxation of C6/C7 cervical vertebrae, initial encounter: Secondary | ICD-10-CM | POA: Diagnosis not present

## 2014-12-04 DIAGNOSIS — M531 Cervicobrachial syndrome: Secondary | ICD-10-CM | POA: Diagnosis not present

## 2014-12-04 DIAGNOSIS — M9901 Segmental and somatic dysfunction of cervical region: Secondary | ICD-10-CM | POA: Diagnosis not present

## 2014-12-04 DIAGNOSIS — M25511 Pain in right shoulder: Secondary | ICD-10-CM | POA: Diagnosis not present

## 2014-12-19 DIAGNOSIS — E0590 Thyrotoxicosis, unspecified without thyrotoxic crisis or storm: Secondary | ICD-10-CM | POA: Diagnosis not present

## 2015-01-24 DIAGNOSIS — H52223 Regular astigmatism, bilateral: Secondary | ICD-10-CM | POA: Diagnosis not present

## 2015-01-24 DIAGNOSIS — H11153 Pinguecula, bilateral: Secondary | ICD-10-CM | POA: Diagnosis not present

## 2015-01-24 DIAGNOSIS — H5203 Hypermetropia, bilateral: Secondary | ICD-10-CM | POA: Diagnosis not present

## 2015-01-24 DIAGNOSIS — H15843 Scleral ectasia, bilateral: Secondary | ICD-10-CM | POA: Diagnosis not present

## 2015-01-24 DIAGNOSIS — H524 Presbyopia: Secondary | ICD-10-CM | POA: Diagnosis not present

## 2015-01-24 DIAGNOSIS — M9904 Segmental and somatic dysfunction of sacral region: Secondary | ICD-10-CM | POA: Diagnosis not present

## 2015-01-24 DIAGNOSIS — M9901 Segmental and somatic dysfunction of cervical region: Secondary | ICD-10-CM | POA: Diagnosis not present

## 2015-01-24 DIAGNOSIS — M797 Fibromyalgia: Secondary | ICD-10-CM | POA: Diagnosis not present

## 2015-01-26 DIAGNOSIS — Z0000 Encounter for general adult medical examination without abnormal findings: Secondary | ICD-10-CM | POA: Diagnosis not present

## 2015-01-26 DIAGNOSIS — E785 Hyperlipidemia, unspecified: Secondary | ICD-10-CM | POA: Diagnosis not present

## 2015-01-26 DIAGNOSIS — E0590 Thyrotoxicosis, unspecified without thyrotoxic crisis or storm: Secondary | ICD-10-CM | POA: Diagnosis not present

## 2015-01-26 DIAGNOSIS — M859 Disorder of bone density and structure, unspecified: Secondary | ICD-10-CM | POA: Diagnosis not present

## 2015-01-30 DIAGNOSIS — Z1212 Encounter for screening for malignant neoplasm of rectum: Secondary | ICD-10-CM | POA: Diagnosis not present

## 2015-02-02 ENCOUNTER — Other Ambulatory Visit: Payer: Self-pay | Admitting: Internal Medicine

## 2015-02-02 DIAGNOSIS — F17209 Nicotine dependence, unspecified, with unspecified nicotine-induced disorders: Secondary | ICD-10-CM

## 2015-02-02 DIAGNOSIS — M19041 Primary osteoarthritis, right hand: Secondary | ICD-10-CM | POA: Diagnosis not present

## 2015-02-02 DIAGNOSIS — E0590 Thyrotoxicosis, unspecified without thyrotoxic crisis or storm: Secondary | ICD-10-CM | POA: Diagnosis not present

## 2015-02-02 DIAGNOSIS — Z0000 Encounter for general adult medical examination without abnormal findings: Secondary | ICD-10-CM | POA: Diagnosis not present

## 2015-02-02 DIAGNOSIS — E0500 Thyrotoxicosis with diffuse goiter without thyrotoxic crisis or storm: Secondary | ICD-10-CM | POA: Diagnosis not present

## 2015-02-02 DIAGNOSIS — M4800 Spinal stenosis, site unspecified: Secondary | ICD-10-CM | POA: Diagnosis not present

## 2015-02-02 DIAGNOSIS — M859 Disorder of bone density and structure, unspecified: Secondary | ICD-10-CM | POA: Diagnosis not present

## 2015-02-02 DIAGNOSIS — Z681 Body mass index (BMI) 19 or less, adult: Secondary | ICD-10-CM | POA: Diagnosis not present

## 2015-02-02 DIAGNOSIS — Z1389 Encounter for screening for other disorder: Secondary | ICD-10-CM | POA: Diagnosis not present

## 2015-02-02 DIAGNOSIS — F17200 Nicotine dependence, unspecified, uncomplicated: Secondary | ICD-10-CM | POA: Diagnosis not present

## 2015-02-02 DIAGNOSIS — E785 Hyperlipidemia, unspecified: Secondary | ICD-10-CM | POA: Diagnosis not present

## 2015-03-07 DIAGNOSIS — Q762 Congenital spondylolisthesis: Secondary | ICD-10-CM | POA: Diagnosis not present

## 2015-03-07 DIAGNOSIS — M9904 Segmental and somatic dysfunction of sacral region: Secondary | ICD-10-CM | POA: Diagnosis not present

## 2015-03-07 DIAGNOSIS — G541 Lumbosacral plexus disorders: Secondary | ICD-10-CM | POA: Diagnosis not present

## 2015-03-07 DIAGNOSIS — M545 Low back pain: Secondary | ICD-10-CM | POA: Diagnosis not present

## 2015-03-15 ENCOUNTER — Ambulatory Visit
Admission: RE | Admit: 2015-03-15 | Discharge: 2015-03-15 | Disposition: A | Discharge status: Home / Self Care | Payer: Medicare Other | Source: Ambulatory Visit | Attending: Internal Medicine | Admitting: Internal Medicine

## 2015-03-15 DIAGNOSIS — F17209 Nicotine dependence, unspecified, with unspecified nicotine-induced disorders: Secondary | ICD-10-CM

## 2015-03-15 DIAGNOSIS — Z122 Encounter for screening for malignant neoplasm of respiratory organs: Secondary | ICD-10-CM | POA: Diagnosis not present

## 2015-03-15 DIAGNOSIS — Z87891 Personal history of nicotine dependence: Secondary | ICD-10-CM | POA: Diagnosis not present

## 2015-04-05 DIAGNOSIS — M9903 Segmental and somatic dysfunction of lumbar region: Secondary | ICD-10-CM | POA: Diagnosis not present

## 2015-04-05 DIAGNOSIS — M9901 Segmental and somatic dysfunction of cervical region: Secondary | ICD-10-CM | POA: Diagnosis not present

## 2015-04-05 DIAGNOSIS — M797 Fibromyalgia: Secondary | ICD-10-CM | POA: Diagnosis not present

## 2015-04-30 DIAGNOSIS — M9904 Segmental and somatic dysfunction of sacral region: Secondary | ICD-10-CM | POA: Diagnosis not present

## 2015-04-30 DIAGNOSIS — G541 Lumbosacral plexus disorders: Secondary | ICD-10-CM | POA: Diagnosis not present

## 2015-04-30 DIAGNOSIS — M5416 Radiculopathy, lumbar region: Secondary | ICD-10-CM | POA: Diagnosis not present

## 2015-04-30 DIAGNOSIS — M545 Low back pain: Secondary | ICD-10-CM | POA: Diagnosis not present

## 2015-05-15 DIAGNOSIS — E0590 Thyrotoxicosis, unspecified without thyrotoxic crisis or storm: Secondary | ICD-10-CM | POA: Diagnosis not present

## 2015-05-15 DIAGNOSIS — Z79899 Other long term (current) drug therapy: Secondary | ICD-10-CM | POA: Diagnosis not present

## 2015-05-16 DIAGNOSIS — L218 Other seborrheic dermatitis: Secondary | ICD-10-CM | POA: Diagnosis not present

## 2015-05-16 DIAGNOSIS — L720 Epidermal cyst: Secondary | ICD-10-CM | POA: Diagnosis not present

## 2015-05-16 DIAGNOSIS — L570 Actinic keratosis: Secondary | ICD-10-CM | POA: Diagnosis not present

## 2015-05-16 DIAGNOSIS — Z85828 Personal history of other malignant neoplasm of skin: Secondary | ICD-10-CM | POA: Diagnosis not present

## 2015-05-16 DIAGNOSIS — D2239 Melanocytic nevi of other parts of face: Secondary | ICD-10-CM | POA: Diagnosis not present

## 2015-05-16 DIAGNOSIS — L820 Inflamed seborrheic keratosis: Secondary | ICD-10-CM | POA: Diagnosis not present

## 2015-05-16 DIAGNOSIS — L821 Other seborrheic keratosis: Secondary | ICD-10-CM | POA: Diagnosis not present

## 2015-05-16 DIAGNOSIS — D1801 Hemangioma of skin and subcutaneous tissue: Secondary | ICD-10-CM | POA: Diagnosis not present

## 2015-06-01 DIAGNOSIS — Z23 Encounter for immunization: Secondary | ICD-10-CM | POA: Diagnosis not present

## 2015-06-05 DIAGNOSIS — S13160A Subluxation of C5/C6 cervical vertebrae, initial encounter: Secondary | ICD-10-CM | POA: Diagnosis not present

## 2015-06-05 DIAGNOSIS — M25511 Pain in right shoulder: Secondary | ICD-10-CM | POA: Diagnosis not present

## 2015-06-05 DIAGNOSIS — M9901 Segmental and somatic dysfunction of cervical region: Secondary | ICD-10-CM | POA: Diagnosis not present

## 2015-06-05 DIAGNOSIS — M5412 Radiculopathy, cervical region: Secondary | ICD-10-CM | POA: Diagnosis not present

## 2015-07-11 DIAGNOSIS — M9901 Segmental and somatic dysfunction of cervical region: Secondary | ICD-10-CM | POA: Diagnosis not present

## 2015-07-11 DIAGNOSIS — M25511 Pain in right shoulder: Secondary | ICD-10-CM | POA: Diagnosis not present

## 2015-07-11 DIAGNOSIS — S4351XA Sprain of right acromioclavicular joint, initial encounter: Secondary | ICD-10-CM | POA: Diagnosis not present

## 2015-07-11 DIAGNOSIS — M5412 Radiculopathy, cervical region: Secondary | ICD-10-CM | POA: Diagnosis not present

## 2015-07-22 DIAGNOSIS — L259 Unspecified contact dermatitis, unspecified cause: Secondary | ICD-10-CM | POA: Diagnosis not present

## 2015-08-15 DIAGNOSIS — M25511 Pain in right shoulder: Secondary | ICD-10-CM | POA: Diagnosis not present

## 2015-08-15 DIAGNOSIS — M5412 Radiculopathy, cervical region: Secondary | ICD-10-CM | POA: Diagnosis not present

## 2015-08-15 DIAGNOSIS — M9901 Segmental and somatic dysfunction of cervical region: Secondary | ICD-10-CM | POA: Diagnosis not present

## 2015-08-15 DIAGNOSIS — M50322 Other cervical disc degeneration at C5-C6 level: Secondary | ICD-10-CM | POA: Diagnosis not present

## 2015-08-29 DIAGNOSIS — L500 Allergic urticaria: Secondary | ICD-10-CM | POA: Diagnosis not present

## 2015-08-29 DIAGNOSIS — Z85828 Personal history of other malignant neoplasm of skin: Secondary | ICD-10-CM | POA: Diagnosis not present

## 2015-09-05 DIAGNOSIS — Z79899 Other long term (current) drug therapy: Secondary | ICD-10-CM | POA: Diagnosis not present

## 2015-09-12 DIAGNOSIS — M542 Cervicalgia: Secondary | ICD-10-CM | POA: Diagnosis not present

## 2015-09-12 DIAGNOSIS — M9901 Segmental and somatic dysfunction of cervical region: Secondary | ICD-10-CM | POA: Diagnosis not present

## 2015-09-12 DIAGNOSIS — M545 Low back pain: Secondary | ICD-10-CM | POA: Diagnosis not present

## 2015-09-12 DIAGNOSIS — M9903 Segmental and somatic dysfunction of lumbar region: Secondary | ICD-10-CM | POA: Diagnosis not present

## 2015-10-09 DIAGNOSIS — M9901 Segmental and somatic dysfunction of cervical region: Secondary | ICD-10-CM | POA: Diagnosis not present

## 2015-10-09 DIAGNOSIS — M545 Low back pain: Secondary | ICD-10-CM | POA: Diagnosis not present

## 2015-10-09 DIAGNOSIS — M542 Cervicalgia: Secondary | ICD-10-CM | POA: Diagnosis not present

## 2015-10-09 DIAGNOSIS — M9903 Segmental and somatic dysfunction of lumbar region: Secondary | ICD-10-CM | POA: Diagnosis not present

## 2015-10-11 DIAGNOSIS — H26493 Other secondary cataract, bilateral: Secondary | ICD-10-CM | POA: Diagnosis not present

## 2015-10-11 DIAGNOSIS — H52223 Regular astigmatism, bilateral: Secondary | ICD-10-CM | POA: Diagnosis not present

## 2015-10-11 DIAGNOSIS — H43393 Other vitreous opacities, bilateral: Secondary | ICD-10-CM | POA: Diagnosis not present

## 2015-10-11 DIAGNOSIS — H5203 Hypermetropia, bilateral: Secondary | ICD-10-CM | POA: Diagnosis not present

## 2015-10-11 DIAGNOSIS — H524 Presbyopia: Secondary | ICD-10-CM | POA: Diagnosis not present

## 2015-10-11 DIAGNOSIS — H33301 Unspecified retinal break, right eye: Secondary | ICD-10-CM | POA: Diagnosis not present

## 2015-10-11 DIAGNOSIS — Z961 Presence of intraocular lens: Secondary | ICD-10-CM | POA: Diagnosis not present

## 2015-11-07 DIAGNOSIS — M542 Cervicalgia: Secondary | ICD-10-CM | POA: Diagnosis not present

## 2015-11-07 DIAGNOSIS — M545 Low back pain: Secondary | ICD-10-CM | POA: Diagnosis not present

## 2015-11-07 DIAGNOSIS — M9901 Segmental and somatic dysfunction of cervical region: Secondary | ICD-10-CM | POA: Diagnosis not present

## 2015-11-07 DIAGNOSIS — M9903 Segmental and somatic dysfunction of lumbar region: Secondary | ICD-10-CM | POA: Diagnosis not present

## 2015-11-13 DIAGNOSIS — Z79899 Other long term (current) drug therapy: Secondary | ICD-10-CM | POA: Diagnosis not present

## 2015-11-13 DIAGNOSIS — Z85828 Personal history of other malignant neoplasm of skin: Secondary | ICD-10-CM | POA: Diagnosis not present

## 2015-11-13 DIAGNOSIS — L820 Inflamed seborrheic keratosis: Secondary | ICD-10-CM | POA: Diagnosis not present

## 2015-11-13 DIAGNOSIS — L821 Other seborrheic keratosis: Secondary | ICD-10-CM | POA: Diagnosis not present

## 2015-11-13 DIAGNOSIS — E0590 Thyrotoxicosis, unspecified without thyrotoxic crisis or storm: Secondary | ICD-10-CM | POA: Diagnosis not present

## 2015-11-13 DIAGNOSIS — L309 Dermatitis, unspecified: Secondary | ICD-10-CM | POA: Diagnosis not present

## 2015-12-05 DIAGNOSIS — M545 Low back pain: Secondary | ICD-10-CM | POA: Diagnosis not present

## 2015-12-05 DIAGNOSIS — M9903 Segmental and somatic dysfunction of lumbar region: Secondary | ICD-10-CM | POA: Diagnosis not present

## 2015-12-05 DIAGNOSIS — M9901 Segmental and somatic dysfunction of cervical region: Secondary | ICD-10-CM | POA: Diagnosis not present

## 2015-12-05 DIAGNOSIS — M797 Fibromyalgia: Secondary | ICD-10-CM | POA: Diagnosis not present

## 2015-12-13 DIAGNOSIS — E0590 Thyrotoxicosis, unspecified without thyrotoxic crisis or storm: Secondary | ICD-10-CM | POA: Diagnosis not present

## 2015-12-26 DIAGNOSIS — M542 Cervicalgia: Secondary | ICD-10-CM | POA: Diagnosis not present

## 2015-12-26 DIAGNOSIS — M545 Low back pain: Secondary | ICD-10-CM | POA: Diagnosis not present

## 2015-12-26 DIAGNOSIS — M9903 Segmental and somatic dysfunction of lumbar region: Secondary | ICD-10-CM | POA: Diagnosis not present

## 2015-12-26 DIAGNOSIS — M9901 Segmental and somatic dysfunction of cervical region: Secondary | ICD-10-CM | POA: Diagnosis not present

## 2016-01-21 DIAGNOSIS — M9904 Segmental and somatic dysfunction of sacral region: Secondary | ICD-10-CM | POA: Diagnosis not present

## 2016-01-21 DIAGNOSIS — M545 Low back pain: Secondary | ICD-10-CM | POA: Diagnosis not present

## 2016-01-21 DIAGNOSIS — M9903 Segmental and somatic dysfunction of lumbar region: Secondary | ICD-10-CM | POA: Diagnosis not present

## 2016-01-21 DIAGNOSIS — M797 Fibromyalgia: Secondary | ICD-10-CM | POA: Diagnosis not present

## 2016-02-12 DIAGNOSIS — E0590 Thyrotoxicosis, unspecified without thyrotoxic crisis or storm: Secondary | ICD-10-CM | POA: Diagnosis not present

## 2016-02-12 DIAGNOSIS — M859 Disorder of bone density and structure, unspecified: Secondary | ICD-10-CM | POA: Diagnosis not present

## 2016-02-12 DIAGNOSIS — E784 Other hyperlipidemia: Secondary | ICD-10-CM | POA: Diagnosis not present

## 2016-02-15 DIAGNOSIS — Z1212 Encounter for screening for malignant neoplasm of rectum: Secondary | ICD-10-CM | POA: Diagnosis not present

## 2016-02-27 DIAGNOSIS — Z0000 Encounter for general adult medical examination without abnormal findings: Secondary | ICD-10-CM | POA: Diagnosis not present

## 2016-02-27 DIAGNOSIS — M75101 Unspecified rotator cuff tear or rupture of right shoulder, not specified as traumatic: Secondary | ICD-10-CM | POA: Diagnosis not present

## 2016-02-27 DIAGNOSIS — Z681 Body mass index (BMI) 19 or less, adult: Secondary | ICD-10-CM | POA: Diagnosis not present

## 2016-02-27 DIAGNOSIS — F17200 Nicotine dependence, unspecified, uncomplicated: Secondary | ICD-10-CM | POA: Diagnosis not present

## 2016-02-27 DIAGNOSIS — Z1389 Encounter for screening for other disorder: Secondary | ICD-10-CM | POA: Diagnosis not present

## 2016-02-27 DIAGNOSIS — M4800 Spinal stenosis, site unspecified: Secondary | ICD-10-CM | POA: Diagnosis not present

## 2016-02-27 DIAGNOSIS — E0500 Thyrotoxicosis with diffuse goiter without thyrotoxic crisis or storm: Secondary | ICD-10-CM | POA: Diagnosis not present

## 2016-02-27 DIAGNOSIS — E784 Other hyperlipidemia: Secondary | ICD-10-CM | POA: Diagnosis not present

## 2016-02-27 DIAGNOSIS — M859 Disorder of bone density and structure, unspecified: Secondary | ICD-10-CM | POA: Diagnosis not present

## 2016-02-27 DIAGNOSIS — E0590 Thyrotoxicosis, unspecified without thyrotoxic crisis or storm: Secondary | ICD-10-CM | POA: Diagnosis not present

## 2016-03-05 DIAGNOSIS — Z1231 Encounter for screening mammogram for malignant neoplasm of breast: Secondary | ICD-10-CM | POA: Diagnosis not present

## 2016-03-06 DIAGNOSIS — Z1231 Encounter for screening mammogram for malignant neoplasm of breast: Secondary | ICD-10-CM | POA: Diagnosis not present

## 2016-03-06 DIAGNOSIS — R922 Inconclusive mammogram: Secondary | ICD-10-CM | POA: Diagnosis not present

## 2016-03-06 DIAGNOSIS — R928 Other abnormal and inconclusive findings on diagnostic imaging of breast: Secondary | ICD-10-CM | POA: Diagnosis not present

## 2016-03-22 ENCOUNTER — Other Ambulatory Visit: Payer: Self-pay | Admitting: Internal Medicine

## 2016-03-22 DIAGNOSIS — F17210 Nicotine dependence, cigarettes, uncomplicated: Secondary | ICD-10-CM

## 2016-03-24 DIAGNOSIS — M542 Cervicalgia: Secondary | ICD-10-CM | POA: Diagnosis not present

## 2016-03-24 DIAGNOSIS — M9901 Segmental and somatic dysfunction of cervical region: Secondary | ICD-10-CM | POA: Diagnosis not present

## 2016-03-24 DIAGNOSIS — M9903 Segmental and somatic dysfunction of lumbar region: Secondary | ICD-10-CM | POA: Diagnosis not present

## 2016-03-24 DIAGNOSIS — M545 Low back pain: Secondary | ICD-10-CM | POA: Diagnosis not present

## 2016-04-22 DIAGNOSIS — M9903 Segmental and somatic dysfunction of lumbar region: Secondary | ICD-10-CM | POA: Diagnosis not present

## 2016-04-22 DIAGNOSIS — M9901 Segmental and somatic dysfunction of cervical region: Secondary | ICD-10-CM | POA: Diagnosis not present

## 2016-04-22 DIAGNOSIS — M9905 Segmental and somatic dysfunction of pelvic region: Secondary | ICD-10-CM | POA: Diagnosis not present

## 2016-04-22 DIAGNOSIS — M9902 Segmental and somatic dysfunction of thoracic region: Secondary | ICD-10-CM | POA: Diagnosis not present

## 2016-04-22 DIAGNOSIS — M4604 Spinal enthesopathy, thoracic region: Secondary | ICD-10-CM | POA: Diagnosis not present

## 2016-04-22 DIAGNOSIS — M4716 Other spondylosis with myelopathy, lumbar region: Secondary | ICD-10-CM | POA: Diagnosis not present

## 2016-04-22 DIAGNOSIS — M4135 Thoracogenic scoliosis, thoracolumbar region: Secondary | ICD-10-CM | POA: Diagnosis not present

## 2016-04-22 DIAGNOSIS — M791 Myalgia: Secondary | ICD-10-CM | POA: Diagnosis not present

## 2016-04-25 DIAGNOSIS — M62830 Muscle spasm of back: Secondary | ICD-10-CM | POA: Diagnosis not present

## 2016-04-25 DIAGNOSIS — M7542 Impingement syndrome of left shoulder: Secondary | ICD-10-CM | POA: Diagnosis not present

## 2016-04-25 DIAGNOSIS — M9907 Segmental and somatic dysfunction of upper extremity: Secondary | ICD-10-CM | POA: Diagnosis not present

## 2016-04-25 DIAGNOSIS — M791 Myalgia: Secondary | ICD-10-CM | POA: Diagnosis not present

## 2016-04-25 DIAGNOSIS — M9903 Segmental and somatic dysfunction of lumbar region: Secondary | ICD-10-CM | POA: Diagnosis not present

## 2016-04-25 DIAGNOSIS — M9905 Segmental and somatic dysfunction of pelvic region: Secondary | ICD-10-CM | POA: Diagnosis not present

## 2016-04-25 DIAGNOSIS — M7541 Impingement syndrome of right shoulder: Secondary | ICD-10-CM | POA: Diagnosis not present

## 2016-04-25 DIAGNOSIS — M542 Cervicalgia: Secondary | ICD-10-CM | POA: Diagnosis not present

## 2016-04-25 DIAGNOSIS — M9902 Segmental and somatic dysfunction of thoracic region: Secondary | ICD-10-CM | POA: Diagnosis not present

## 2016-04-25 DIAGNOSIS — M9901 Segmental and somatic dysfunction of cervical region: Secondary | ICD-10-CM | POA: Diagnosis not present

## 2016-04-28 ENCOUNTER — Ambulatory Visit
Admission: RE | Admit: 2016-04-28 | Discharge: 2016-04-28 | Disposition: A | Discharge status: Home / Self Care | Payer: Medicare Other | Source: Ambulatory Visit | Attending: Internal Medicine | Admitting: Internal Medicine

## 2016-04-28 DIAGNOSIS — Z87891 Personal history of nicotine dependence: Secondary | ICD-10-CM | POA: Diagnosis not present

## 2016-04-28 DIAGNOSIS — F17210 Nicotine dependence, cigarettes, uncomplicated: Secondary | ICD-10-CM

## 2016-04-29 DIAGNOSIS — M9905 Segmental and somatic dysfunction of pelvic region: Secondary | ICD-10-CM | POA: Diagnosis not present

## 2016-04-29 DIAGNOSIS — M9907 Segmental and somatic dysfunction of upper extremity: Secondary | ICD-10-CM | POA: Diagnosis not present

## 2016-04-29 DIAGNOSIS — M9903 Segmental and somatic dysfunction of lumbar region: Secondary | ICD-10-CM | POA: Diagnosis not present

## 2016-04-29 DIAGNOSIS — M7541 Impingement syndrome of right shoulder: Secondary | ICD-10-CM | POA: Diagnosis not present

## 2016-04-29 DIAGNOSIS — M9901 Segmental and somatic dysfunction of cervical region: Secondary | ICD-10-CM | POA: Diagnosis not present

## 2016-04-29 DIAGNOSIS — M7542 Impingement syndrome of left shoulder: Secondary | ICD-10-CM | POA: Diagnosis not present

## 2016-04-29 DIAGNOSIS — M542 Cervicalgia: Secondary | ICD-10-CM | POA: Diagnosis not present

## 2016-04-29 DIAGNOSIS — M9902 Segmental and somatic dysfunction of thoracic region: Secondary | ICD-10-CM | POA: Diagnosis not present

## 2016-04-29 DIAGNOSIS — M791 Myalgia: Secondary | ICD-10-CM | POA: Diagnosis not present

## 2016-04-29 DIAGNOSIS — M62830 Muscle spasm of back: Secondary | ICD-10-CM | POA: Diagnosis not present

## 2016-05-08 DIAGNOSIS — M9905 Segmental and somatic dysfunction of pelvic region: Secondary | ICD-10-CM | POA: Diagnosis not present

## 2016-05-08 DIAGNOSIS — M9903 Segmental and somatic dysfunction of lumbar region: Secondary | ICD-10-CM | POA: Diagnosis not present

## 2016-05-08 DIAGNOSIS — M62830 Muscle spasm of back: Secondary | ICD-10-CM | POA: Diagnosis not present

## 2016-05-08 DIAGNOSIS — M9907 Segmental and somatic dysfunction of upper extremity: Secondary | ICD-10-CM | POA: Diagnosis not present

## 2016-05-08 DIAGNOSIS — M542 Cervicalgia: Secondary | ICD-10-CM | POA: Diagnosis not present

## 2016-05-08 DIAGNOSIS — M9902 Segmental and somatic dysfunction of thoracic region: Secondary | ICD-10-CM | POA: Diagnosis not present

## 2016-05-08 DIAGNOSIS — M791 Myalgia: Secondary | ICD-10-CM | POA: Diagnosis not present

## 2016-05-08 DIAGNOSIS — M9901 Segmental and somatic dysfunction of cervical region: Secondary | ICD-10-CM | POA: Diagnosis not present

## 2016-05-08 DIAGNOSIS — M7542 Impingement syndrome of left shoulder: Secondary | ICD-10-CM | POA: Diagnosis not present

## 2016-05-08 DIAGNOSIS — M7541 Impingement syndrome of right shoulder: Secondary | ICD-10-CM | POA: Diagnosis not present

## 2016-05-20 DIAGNOSIS — L853 Xerosis cutis: Secondary | ICD-10-CM | POA: Diagnosis not present

## 2016-05-20 DIAGNOSIS — D692 Other nonthrombocytopenic purpura: Secondary | ICD-10-CM | POA: Diagnosis not present

## 2016-05-20 DIAGNOSIS — L570 Actinic keratosis: Secondary | ICD-10-CM | POA: Diagnosis not present

## 2016-05-20 DIAGNOSIS — Z85828 Personal history of other malignant neoplasm of skin: Secondary | ICD-10-CM | POA: Diagnosis not present

## 2016-05-20 DIAGNOSIS — L821 Other seborrheic keratosis: Secondary | ICD-10-CM | POA: Diagnosis not present

## 2016-05-20 DIAGNOSIS — D1801 Hemangioma of skin and subcutaneous tissue: Secondary | ICD-10-CM | POA: Diagnosis not present

## 2016-06-05 DIAGNOSIS — Z23 Encounter for immunization: Secondary | ICD-10-CM | POA: Diagnosis not present

## 2016-06-17 DIAGNOSIS — E0590 Thyrotoxicosis, unspecified without thyrotoxic crisis or storm: Secondary | ICD-10-CM | POA: Diagnosis not present

## 2016-06-18 DIAGNOSIS — M9901 Segmental and somatic dysfunction of cervical region: Secondary | ICD-10-CM | POA: Diagnosis not present

## 2016-06-18 DIAGNOSIS — M7542 Impingement syndrome of left shoulder: Secondary | ICD-10-CM | POA: Diagnosis not present

## 2016-06-18 DIAGNOSIS — M9902 Segmental and somatic dysfunction of thoracic region: Secondary | ICD-10-CM | POA: Diagnosis not present

## 2016-06-18 DIAGNOSIS — M791 Myalgia: Secondary | ICD-10-CM | POA: Diagnosis not present

## 2016-06-18 DIAGNOSIS — M542 Cervicalgia: Secondary | ICD-10-CM | POA: Diagnosis not present

## 2016-06-18 DIAGNOSIS — M7541 Impingement syndrome of right shoulder: Secondary | ICD-10-CM | POA: Diagnosis not present

## 2016-06-18 DIAGNOSIS — M9905 Segmental and somatic dysfunction of pelvic region: Secondary | ICD-10-CM | POA: Diagnosis not present

## 2016-06-18 DIAGNOSIS — M9903 Segmental and somatic dysfunction of lumbar region: Secondary | ICD-10-CM | POA: Diagnosis not present

## 2016-06-18 DIAGNOSIS — M62830 Muscle spasm of back: Secondary | ICD-10-CM | POA: Diagnosis not present

## 2016-06-18 DIAGNOSIS — M9907 Segmental and somatic dysfunction of upper extremity: Secondary | ICD-10-CM | POA: Diagnosis not present

## 2016-07-14 DIAGNOSIS — M9901 Segmental and somatic dysfunction of cervical region: Secondary | ICD-10-CM | POA: Diagnosis not present

## 2016-07-14 DIAGNOSIS — M9903 Segmental and somatic dysfunction of lumbar region: Secondary | ICD-10-CM | POA: Diagnosis not present

## 2016-07-14 DIAGNOSIS — M9905 Segmental and somatic dysfunction of pelvic region: Secondary | ICD-10-CM | POA: Diagnosis not present

## 2016-07-14 DIAGNOSIS — M9902 Segmental and somatic dysfunction of thoracic region: Secondary | ICD-10-CM | POA: Diagnosis not present

## 2016-08-18 DIAGNOSIS — M9903 Segmental and somatic dysfunction of lumbar region: Secondary | ICD-10-CM | POA: Diagnosis not present

## 2016-08-18 DIAGNOSIS — M9902 Segmental and somatic dysfunction of thoracic region: Secondary | ICD-10-CM | POA: Diagnosis not present

## 2016-08-18 DIAGNOSIS — M9901 Segmental and somatic dysfunction of cervical region: Secondary | ICD-10-CM | POA: Diagnosis not present

## 2016-08-18 DIAGNOSIS — M9905 Segmental and somatic dysfunction of pelvic region: Secondary | ICD-10-CM | POA: Diagnosis not present

## 2016-09-16 DIAGNOSIS — H9190 Unspecified hearing loss, unspecified ear: Secondary | ICD-10-CM | POA: Diagnosis not present

## 2016-10-08 DIAGNOSIS — M9905 Segmental and somatic dysfunction of pelvic region: Secondary | ICD-10-CM | POA: Diagnosis not present

## 2016-10-08 DIAGNOSIS — M9903 Segmental and somatic dysfunction of lumbar region: Secondary | ICD-10-CM | POA: Diagnosis not present

## 2016-10-08 DIAGNOSIS — M9902 Segmental and somatic dysfunction of thoracic region: Secondary | ICD-10-CM | POA: Diagnosis not present

## 2016-10-08 DIAGNOSIS — M9901 Segmental and somatic dysfunction of cervical region: Secondary | ICD-10-CM | POA: Diagnosis not present

## 2016-11-17 DIAGNOSIS — M9903 Segmental and somatic dysfunction of lumbar region: Secondary | ICD-10-CM | POA: Diagnosis not present

## 2016-11-17 DIAGNOSIS — M9901 Segmental and somatic dysfunction of cervical region: Secondary | ICD-10-CM | POA: Diagnosis not present

## 2016-11-17 DIAGNOSIS — M9902 Segmental and somatic dysfunction of thoracic region: Secondary | ICD-10-CM | POA: Diagnosis not present

## 2016-11-17 DIAGNOSIS — M9905 Segmental and somatic dysfunction of pelvic region: Secondary | ICD-10-CM | POA: Diagnosis not present

## 2016-12-16 DIAGNOSIS — E0590 Thyrotoxicosis, unspecified without thyrotoxic crisis or storm: Secondary | ICD-10-CM | POA: Diagnosis not present

## 2017-01-12 DIAGNOSIS — M9902 Segmental and somatic dysfunction of thoracic region: Secondary | ICD-10-CM | POA: Diagnosis not present

## 2017-01-12 DIAGNOSIS — M9905 Segmental and somatic dysfunction of pelvic region: Secondary | ICD-10-CM | POA: Diagnosis not present

## 2017-01-12 DIAGNOSIS — M9901 Segmental and somatic dysfunction of cervical region: Secondary | ICD-10-CM | POA: Diagnosis not present

## 2017-01-12 DIAGNOSIS — M9903 Segmental and somatic dysfunction of lumbar region: Secondary | ICD-10-CM | POA: Diagnosis not present

## 2017-02-03 DIAGNOSIS — M9903 Segmental and somatic dysfunction of lumbar region: Secondary | ICD-10-CM | POA: Diagnosis not present

## 2017-02-03 DIAGNOSIS — M9905 Segmental and somatic dysfunction of pelvic region: Secondary | ICD-10-CM | POA: Diagnosis not present

## 2017-02-03 DIAGNOSIS — M9901 Segmental and somatic dysfunction of cervical region: Secondary | ICD-10-CM | POA: Diagnosis not present

## 2017-02-03 DIAGNOSIS — M9902 Segmental and somatic dysfunction of thoracic region: Secondary | ICD-10-CM | POA: Diagnosis not present

## 2017-02-11 DIAGNOSIS — M9902 Segmental and somatic dysfunction of thoracic region: Secondary | ICD-10-CM | POA: Diagnosis not present

## 2017-02-11 DIAGNOSIS — M9903 Segmental and somatic dysfunction of lumbar region: Secondary | ICD-10-CM | POA: Diagnosis not present

## 2017-02-11 DIAGNOSIS — M9901 Segmental and somatic dysfunction of cervical region: Secondary | ICD-10-CM | POA: Diagnosis not present

## 2017-02-11 DIAGNOSIS — M9905 Segmental and somatic dysfunction of pelvic region: Secondary | ICD-10-CM | POA: Diagnosis not present

## 2017-03-09 DIAGNOSIS — E0500 Thyrotoxicosis with diffuse goiter without thyrotoxic crisis or storm: Secondary | ICD-10-CM | POA: Diagnosis not present

## 2017-03-09 DIAGNOSIS — N390 Urinary tract infection, site not specified: Secondary | ICD-10-CM | POA: Diagnosis not present

## 2017-03-09 DIAGNOSIS — E784 Other hyperlipidemia: Secondary | ICD-10-CM | POA: Diagnosis not present

## 2017-03-09 DIAGNOSIS — M859 Disorder of bone density and structure, unspecified: Secondary | ICD-10-CM | POA: Diagnosis not present

## 2017-03-09 DIAGNOSIS — R8299 Other abnormal findings in urine: Secondary | ICD-10-CM | POA: Diagnosis not present

## 2017-03-09 DIAGNOSIS — Z1231 Encounter for screening mammogram for malignant neoplasm of breast: Secondary | ICD-10-CM | POA: Diagnosis not present

## 2017-03-11 DIAGNOSIS — Z1212 Encounter for screening for malignant neoplasm of rectum: Secondary | ICD-10-CM | POA: Diagnosis not present

## 2017-03-16 ENCOUNTER — Other Ambulatory Visit: Payer: Self-pay | Admitting: Internal Medicine

## 2017-03-16 DIAGNOSIS — Z0000 Encounter for general adult medical examination without abnormal findings: Secondary | ICD-10-CM | POA: Diagnosis not present

## 2017-03-16 DIAGNOSIS — I2510 Atherosclerotic heart disease of native coronary artery without angina pectoris: Secondary | ICD-10-CM | POA: Diagnosis not present

## 2017-03-16 DIAGNOSIS — Z681 Body mass index (BMI) 19 or less, adult: Secondary | ICD-10-CM | POA: Diagnosis not present

## 2017-03-16 DIAGNOSIS — Z1389 Encounter for screening for other disorder: Secondary | ICD-10-CM | POA: Diagnosis not present

## 2017-03-16 DIAGNOSIS — F17200 Nicotine dependence, unspecified, uncomplicated: Secondary | ICD-10-CM | POA: Diagnosis not present

## 2017-03-16 DIAGNOSIS — M859 Disorder of bone density and structure, unspecified: Secondary | ICD-10-CM | POA: Diagnosis not present

## 2017-03-16 DIAGNOSIS — E0500 Thyrotoxicosis with diffuse goiter without thyrotoxic crisis or storm: Secondary | ICD-10-CM | POA: Diagnosis not present

## 2017-03-16 DIAGNOSIS — E784 Other hyperlipidemia: Secondary | ICD-10-CM | POA: Diagnosis not present

## 2017-03-16 DIAGNOSIS — F17209 Nicotine dependence, unspecified, with unspecified nicotine-induced disorders: Secondary | ICD-10-CM

## 2017-03-16 DIAGNOSIS — M4800 Spinal stenosis, site unspecified: Secondary | ICD-10-CM | POA: Diagnosis not present

## 2017-03-16 DIAGNOSIS — E0590 Thyrotoxicosis, unspecified without thyrotoxic crisis or storm: Secondary | ICD-10-CM | POA: Diagnosis not present

## 2017-03-19 DIAGNOSIS — H43813 Vitreous degeneration, bilateral: Secondary | ICD-10-CM | POA: Diagnosis not present

## 2017-03-19 DIAGNOSIS — Z961 Presence of intraocular lens: Secondary | ICD-10-CM | POA: Diagnosis not present

## 2017-03-19 DIAGNOSIS — H04123 Dry eye syndrome of bilateral lacrimal glands: Secondary | ICD-10-CM | POA: Diagnosis not present

## 2017-03-19 DIAGNOSIS — H43393 Other vitreous opacities, bilateral: Secondary | ICD-10-CM | POA: Diagnosis not present

## 2017-03-19 DIAGNOSIS — H26493 Other secondary cataract, bilateral: Secondary | ICD-10-CM | POA: Diagnosis not present

## 2017-03-19 DIAGNOSIS — H33301 Unspecified retinal break, right eye: Secondary | ICD-10-CM | POA: Diagnosis not present

## 2017-03-19 DIAGNOSIS — H3540 Unspecified peripheral retinal degeneration: Secondary | ICD-10-CM | POA: Diagnosis not present

## 2017-03-19 DIAGNOSIS — H52223 Regular astigmatism, bilateral: Secondary | ICD-10-CM | POA: Diagnosis not present

## 2017-03-19 DIAGNOSIS — H5203 Hypermetropia, bilateral: Secondary | ICD-10-CM | POA: Diagnosis not present

## 2017-04-24 DIAGNOSIS — M9903 Segmental and somatic dysfunction of lumbar region: Secondary | ICD-10-CM | POA: Diagnosis not present

## 2017-04-24 DIAGNOSIS — M9901 Segmental and somatic dysfunction of cervical region: Secondary | ICD-10-CM | POA: Diagnosis not present

## 2017-04-24 DIAGNOSIS — M9905 Segmental and somatic dysfunction of pelvic region: Secondary | ICD-10-CM | POA: Diagnosis not present

## 2017-04-24 DIAGNOSIS — M9902 Segmental and somatic dysfunction of thoracic region: Secondary | ICD-10-CM | POA: Diagnosis not present

## 2017-04-30 ENCOUNTER — Ambulatory Visit: Payer: Medicare Other

## 2017-05-18 ENCOUNTER — Ambulatory Visit
Admission: RE | Admit: 2017-05-18 | Discharge: 2017-05-18 | Disposition: A | Discharge status: Home / Self Care | Payer: Medicare Other | Source: Ambulatory Visit | Attending: Internal Medicine | Admitting: Internal Medicine

## 2017-05-18 DIAGNOSIS — F17209 Nicotine dependence, unspecified, with unspecified nicotine-induced disorders: Secondary | ICD-10-CM

## 2017-05-18 DIAGNOSIS — Z87891 Personal history of nicotine dependence: Secondary | ICD-10-CM | POA: Diagnosis not present

## 2017-06-09 DIAGNOSIS — M9903 Segmental and somatic dysfunction of lumbar region: Secondary | ICD-10-CM | POA: Diagnosis not present

## 2017-06-09 DIAGNOSIS — M9902 Segmental and somatic dysfunction of thoracic region: Secondary | ICD-10-CM | POA: Diagnosis not present

## 2017-06-09 DIAGNOSIS — M9905 Segmental and somatic dysfunction of pelvic region: Secondary | ICD-10-CM | POA: Diagnosis not present

## 2017-06-09 DIAGNOSIS — M9901 Segmental and somatic dysfunction of cervical region: Secondary | ICD-10-CM | POA: Diagnosis not present

## 2017-06-10 DIAGNOSIS — Z23 Encounter for immunization: Secondary | ICD-10-CM | POA: Diagnosis not present

## 2017-06-16 DIAGNOSIS — E0590 Thyrotoxicosis, unspecified without thyrotoxic crisis or storm: Secondary | ICD-10-CM | POA: Diagnosis not present

## 2017-08-17 DIAGNOSIS — L821 Other seborrheic keratosis: Secondary | ICD-10-CM | POA: Diagnosis not present

## 2017-08-17 DIAGNOSIS — L570 Actinic keratosis: Secondary | ICD-10-CM | POA: Diagnosis not present

## 2017-08-17 DIAGNOSIS — D1801 Hemangioma of skin and subcutaneous tissue: Secondary | ICD-10-CM | POA: Diagnosis not present

## 2017-08-17 DIAGNOSIS — Z85828 Personal history of other malignant neoplasm of skin: Secondary | ICD-10-CM | POA: Diagnosis not present

## 2017-08-18 DIAGNOSIS — M9902 Segmental and somatic dysfunction of thoracic region: Secondary | ICD-10-CM | POA: Diagnosis not present

## 2017-08-18 DIAGNOSIS — M9901 Segmental and somatic dysfunction of cervical region: Secondary | ICD-10-CM | POA: Diagnosis not present

## 2017-08-18 DIAGNOSIS — M9903 Segmental and somatic dysfunction of lumbar region: Secondary | ICD-10-CM | POA: Diagnosis not present

## 2017-08-18 DIAGNOSIS — M9905 Segmental and somatic dysfunction of pelvic region: Secondary | ICD-10-CM | POA: Diagnosis not present

## 2017-08-24 DIAGNOSIS — M9901 Segmental and somatic dysfunction of cervical region: Secondary | ICD-10-CM | POA: Diagnosis not present

## 2017-08-24 DIAGNOSIS — M9903 Segmental and somatic dysfunction of lumbar region: Secondary | ICD-10-CM | POA: Diagnosis not present

## 2017-08-24 DIAGNOSIS — M9902 Segmental and somatic dysfunction of thoracic region: Secondary | ICD-10-CM | POA: Diagnosis not present

## 2017-08-24 DIAGNOSIS — M9905 Segmental and somatic dysfunction of pelvic region: Secondary | ICD-10-CM | POA: Diagnosis not present

## 2017-09-09 DIAGNOSIS — M9901 Segmental and somatic dysfunction of cervical region: Secondary | ICD-10-CM | POA: Diagnosis not present

## 2017-09-09 DIAGNOSIS — M9902 Segmental and somatic dysfunction of thoracic region: Secondary | ICD-10-CM | POA: Diagnosis not present

## 2017-09-09 DIAGNOSIS — M9903 Segmental and somatic dysfunction of lumbar region: Secondary | ICD-10-CM | POA: Diagnosis not present

## 2017-09-09 DIAGNOSIS — M9905 Segmental and somatic dysfunction of pelvic region: Secondary | ICD-10-CM | POA: Diagnosis not present

## 2017-10-12 DIAGNOSIS — M9901 Segmental and somatic dysfunction of cervical region: Secondary | ICD-10-CM | POA: Diagnosis not present

## 2017-10-12 DIAGNOSIS — M9905 Segmental and somatic dysfunction of pelvic region: Secondary | ICD-10-CM | POA: Diagnosis not present

## 2017-10-12 DIAGNOSIS — M9903 Segmental and somatic dysfunction of lumbar region: Secondary | ICD-10-CM | POA: Diagnosis not present

## 2017-10-12 DIAGNOSIS — M9902 Segmental and somatic dysfunction of thoracic region: Secondary | ICD-10-CM | POA: Diagnosis not present

## 2018-02-15 DIAGNOSIS — M9905 Segmental and somatic dysfunction of pelvic region: Secondary | ICD-10-CM | POA: Diagnosis not present

## 2018-02-15 DIAGNOSIS — M9903 Segmental and somatic dysfunction of lumbar region: Secondary | ICD-10-CM | POA: Diagnosis not present

## 2018-02-15 DIAGNOSIS — M9901 Segmental and somatic dysfunction of cervical region: Secondary | ICD-10-CM | POA: Diagnosis not present

## 2018-02-15 DIAGNOSIS — M9902 Segmental and somatic dysfunction of thoracic region: Secondary | ICD-10-CM | POA: Diagnosis not present

## 2018-02-24 DIAGNOSIS — M9902 Segmental and somatic dysfunction of thoracic region: Secondary | ICD-10-CM | POA: Diagnosis not present

## 2018-02-24 DIAGNOSIS — M9905 Segmental and somatic dysfunction of pelvic region: Secondary | ICD-10-CM | POA: Diagnosis not present

## 2018-02-24 DIAGNOSIS — M9901 Segmental and somatic dysfunction of cervical region: Secondary | ICD-10-CM | POA: Diagnosis not present

## 2018-02-24 DIAGNOSIS — M9903 Segmental and somatic dysfunction of lumbar region: Secondary | ICD-10-CM | POA: Diagnosis not present

## 2018-02-26 ENCOUNTER — Emergency Department (HOSPITAL_COMMUNITY): Payer: Medicare Other

## 2018-02-26 ENCOUNTER — Other Ambulatory Visit: Payer: Self-pay

## 2018-02-26 ENCOUNTER — Inpatient Hospital Stay (HOSPITAL_COMMUNITY)
Admission: EM | Disposition: A | Discharge status: Home / Self Care | Payer: Self-pay | Source: Home / Self Care | Attending: Cardiology

## 2018-02-26 ENCOUNTER — Inpatient Hospital Stay (HOSPITAL_COMMUNITY)
Admission: EM | Admit: 2018-02-26 | Discharge: 2018-03-08 | DRG: 234 | Disposition: A | Discharge status: Home / Self Care | Payer: Medicare Other | Attending: Cardiology | Admitting: Cardiology

## 2018-02-26 ENCOUNTER — Encounter (HOSPITAL_COMMUNITY): Payer: Self-pay | Admitting: Emergency Medicine

## 2018-02-26 ENCOUNTER — Other Ambulatory Visit: Payer: Self-pay | Admitting: *Deleted

## 2018-02-26 DIAGNOSIS — R079 Chest pain, unspecified: Secondary | ICD-10-CM | POA: Diagnosis not present

## 2018-02-26 DIAGNOSIS — I214 Non-ST elevation (NSTEMI) myocardial infarction: Secondary | ICD-10-CM | POA: Diagnosis not present

## 2018-02-26 DIAGNOSIS — Z951 Presence of aortocoronary bypass graft: Secondary | ICD-10-CM

## 2018-02-26 DIAGNOSIS — Z79899 Other long term (current) drug therapy: Secondary | ICD-10-CM

## 2018-02-26 DIAGNOSIS — I200 Unstable angina: Secondary | ICD-10-CM | POA: Diagnosis not present

## 2018-02-26 DIAGNOSIS — E0590 Thyrotoxicosis, unspecified without thyrotoxic crisis or storm: Secondary | ICD-10-CM | POA: Diagnosis present

## 2018-02-26 DIAGNOSIS — D62 Acute posthemorrhagic anemia: Secondary | ICD-10-CM | POA: Diagnosis not present

## 2018-02-26 DIAGNOSIS — D696 Thrombocytopenia, unspecified: Secondary | ICD-10-CM | POA: Diagnosis not present

## 2018-02-26 DIAGNOSIS — R0602 Shortness of breath: Secondary | ICD-10-CM | POA: Diagnosis not present

## 2018-02-26 DIAGNOSIS — E8770 Fluid overload, unspecified: Secondary | ICD-10-CM | POA: Diagnosis not present

## 2018-02-26 DIAGNOSIS — Z90722 Acquired absence of ovaries, bilateral: Secondary | ICD-10-CM

## 2018-02-26 DIAGNOSIS — Z01810 Encounter for preprocedural cardiovascular examination: Secondary | ICD-10-CM | POA: Diagnosis not present

## 2018-02-26 DIAGNOSIS — I2510 Atherosclerotic heart disease of native coronary artery without angina pectoris: Secondary | ICD-10-CM

## 2018-02-26 DIAGNOSIS — E7800 Pure hypercholesterolemia, unspecified: Secondary | ICD-10-CM

## 2018-02-26 DIAGNOSIS — I5030 Unspecified diastolic (congestive) heart failure: Secondary | ICD-10-CM | POA: Diagnosis not present

## 2018-02-26 DIAGNOSIS — I340 Nonrheumatic mitral (valve) insufficiency: Secondary | ICD-10-CM | POA: Diagnosis not present

## 2018-02-26 DIAGNOSIS — R0789 Other chest pain: Secondary | ICD-10-CM | POA: Diagnosis not present

## 2018-02-26 DIAGNOSIS — J9811 Atelectasis: Secondary | ICD-10-CM | POA: Diagnosis present

## 2018-02-26 DIAGNOSIS — I25709 Atherosclerosis of coronary artery bypass graft(s), unspecified, with unspecified angina pectoris: Secondary | ICD-10-CM

## 2018-02-26 DIAGNOSIS — J90 Pleural effusion, not elsewhere classified: Secondary | ICD-10-CM | POA: Diagnosis not present

## 2018-02-26 DIAGNOSIS — E785 Hyperlipidemia, unspecified: Secondary | ICD-10-CM | POA: Diagnosis present

## 2018-02-26 DIAGNOSIS — I25110 Atherosclerotic heart disease of native coronary artery with unstable angina pectoris: Secondary | ICD-10-CM | POA: Diagnosis not present

## 2018-02-26 DIAGNOSIS — Z87891 Personal history of nicotine dependence: Secondary | ICD-10-CM | POA: Diagnosis not present

## 2018-02-26 DIAGNOSIS — Z09 Encounter for follow-up examination after completed treatment for conditions other than malignant neoplasm: Secondary | ICD-10-CM

## 2018-02-26 DIAGNOSIS — Z8249 Family history of ischemic heart disease and other diseases of the circulatory system: Secondary | ICD-10-CM

## 2018-02-26 DIAGNOSIS — N281 Cyst of kidney, acquired: Secondary | ICD-10-CM | POA: Diagnosis not present

## 2018-02-26 DIAGNOSIS — I081 Rheumatic disorders of both mitral and tricuspid valves: Secondary | ICD-10-CM | POA: Diagnosis not present

## 2018-02-26 DIAGNOSIS — Z8349 Family history of other endocrine, nutritional and metabolic diseases: Secondary | ICD-10-CM | POA: Diagnosis not present

## 2018-02-26 DIAGNOSIS — I25118 Atherosclerotic heart disease of native coronary artery with other forms of angina pectoris: Secondary | ICD-10-CM | POA: Diagnosis not present

## 2018-02-26 HISTORY — DX: Disorder of thyroid, unspecified: E07.9

## 2018-02-26 HISTORY — PX: LEFT HEART CATH AND CORONARY ANGIOGRAPHY: CATH118249

## 2018-02-26 HISTORY — DX: Pure hypercholesterolemia, unspecified: E78.00

## 2018-02-26 HISTORY — DX: Malignant (primary) neoplasm, unspecified: C80.1

## 2018-02-26 HISTORY — DX: Unspecified osteoarthritis, unspecified site: M19.90

## 2018-02-26 HISTORY — DX: Thyrotoxicosis with diffuse goiter without thyrotoxic crisis or storm: E05.00

## 2018-02-26 LAB — BASIC METABOLIC PANEL
Anion gap: 10 (ref 5–15)
BUN: 18 mg/dL (ref 8–23)
CHLORIDE: 103 mmol/L (ref 98–111)
CO2: 24 mmol/L (ref 22–32)
Calcium: 9.3 mg/dL (ref 8.9–10.3)
Creatinine, Ser: 0.72 mg/dL (ref 0.44–1.00)
GFR calc Af Amer: >60 mL/min (ref >60)
GFR calc non Af Amer: >60 mL/min (ref >60)
GLUCOSE: 145 mg/dL — AB (ref 70–99)
POTASSIUM: 3.7 mmol/L (ref 3.5–5.1)
SODIUM: 137 mmol/L (ref 135–145)

## 2018-02-26 LAB — CBC
HEMATOCRIT: 40.1 % (ref 36.0–46.0)
Hemoglobin: 13 g/dL (ref 12.0–15.0)
MCH: 31.7 pg (ref 26.0–34.0)
MCHC: 32.4 g/dL (ref 30.0–36.0)
MCV: 97.8 fL (ref 78.0–100.0)
Platelets: 230 10*3/uL (ref 150–400)
RBC: 4.1 MIL/uL (ref 3.87–5.11)
RDW: 12.1 % (ref 11.5–15.5)
WBC: 6.5 10*3/uL (ref 4.0–10.5)

## 2018-02-26 LAB — I-STAT TROPONIN, ED: Troponin i, poc: 0.05 ng/mL (ref 0.00–0.08)

## 2018-02-26 LAB — TROPONIN I: Troponin I: 0.05 ng/mL (ref <0.03)

## 2018-02-26 SURGERY — LEFT HEART CATH AND CORONARY ANGIOGRAPHY
Anesthesia: LOCAL

## 2018-02-26 MED ORDER — HEPARIN SODIUM (PORCINE) 5000 UNIT/ML IJ SOLN: 60.0000 [IU]/kg | Freq: Once | INTRAMUSCULAR | Status: DC

## 2018-02-26 MED ORDER — ACETAMINOPHEN 325 MG PO TABS
650.0000 mg | ORAL_TABLET | ORAL | Status: DC | PRN
  Administered 2018-02-27: 650 mg via ORAL
  Filled 2018-02-26: qty 2

## 2018-02-26 MED ORDER — SODIUM CHLORIDE 0.9% FLUSH: 3.0000 mL | INTRAVENOUS | Status: DC | PRN

## 2018-02-26 MED ORDER — IOPAMIDOL (ISOVUE-370) INJECTION 76%
INTRAVENOUS | Status: AC
  Administered 2018-02-26: 100 mL
  Filled 2018-02-26: qty 100

## 2018-02-26 MED ORDER — FENTANYL CITRATE (PF) 100 MCG/2ML IJ SOLN
INTRAMUSCULAR | Status: DC | PRN
  Administered 2018-02-26: 25 ug via INTRAVENOUS

## 2018-02-26 MED ORDER — NITROGLYCERIN 0.4 MG SL SUBL
0.4000 mg | SUBLINGUAL_TABLET | SUBLINGUAL | Status: DC | PRN
  Administered 2018-02-26 (×2): 0.4 mg via SUBLINGUAL
  Filled 2018-02-26: qty 1

## 2018-02-26 MED ORDER — ONDANSETRON HCL 4 MG/2ML IJ SOLN
4.0000 mg | Freq: Four times a day (QID) | INTRAMUSCULAR | Status: DC | PRN
  Filled 2018-02-26 (×2): qty 2

## 2018-02-26 MED ORDER — VERAPAMIL HCL 2.5 MG/ML IV SOLN
INTRAVENOUS | Status: DC | PRN
  Administered 2018-02-26: 10 mL via INTRA_ARTERIAL

## 2018-02-26 MED ORDER — HEPARIN (PORCINE) IN NACL 100-0.45 UNIT/ML-% IJ SOLN: 12.0000 [IU]/kg/h | INTRAMUSCULAR | Status: DC

## 2018-02-26 MED ORDER — MIDAZOLAM HCL 2 MG/2ML IJ SOLN
INTRAMUSCULAR | Status: DC | PRN
  Administered 2018-02-26: 1 mg via INTRAVENOUS

## 2018-02-26 MED ORDER — ASPIRIN 81 MG PO CHEW
324.0000 mg | CHEWABLE_TABLET | Freq: Once | ORAL | Status: AC
  Administered 2018-02-26: 324 mg via ORAL
  Filled 2018-02-26: qty 4

## 2018-02-26 MED ORDER — SODIUM CHLORIDE 0.9% FLUSH
3.0000 mL | Freq: Two times a day (BID) | INTRAVENOUS | Status: DC
  Administered 2018-02-26 – 2018-03-04 (×6): 3 mL via INTRAVENOUS

## 2018-02-26 MED ORDER — ONDANSETRON HCL 4 MG/2ML IJ SOLN: 4.0000 mg | Freq: Four times a day (QID) | INTRAMUSCULAR | Status: DC | PRN

## 2018-02-26 MED ORDER — HEPARIN BOLUS VIA INFUSION
2000.0000 [IU] | Freq: Once | INTRAVENOUS | Status: AC
  Administered 2018-02-26: 2000 [IU] via INTRAVENOUS
  Filled 2018-02-26: qty 2000

## 2018-02-26 MED ORDER — VERAPAMIL HCL 2.5 MG/ML IV SOLN
INTRAVENOUS | Status: AC
Start: 2018-02-26 — End: ?
  Filled 2018-02-26: qty 2

## 2018-02-26 MED ORDER — ASPIRIN 81 MG PO CHEW: 81.0000 mg | CHEWABLE_TABLET | Freq: Every day | ORAL | Status: DC

## 2018-02-26 MED ORDER — METHIMAZOLE 5 MG PO TABS
5.0000 mg | ORAL_TABLET | ORAL | Status: DC
  Administered 2018-02-26 – 2018-03-06 (×8): 5 mg via ORAL
  Filled 2018-02-26 (×8): qty 1

## 2018-02-26 MED ORDER — ASPIRIN EC 81 MG PO TBEC
81.0000 mg | DELAYED_RELEASE_TABLET | Freq: Every day | ORAL | Status: DC
  Administered 2018-02-27 – 2018-03-03 (×4): 81 mg via ORAL
  Filled 2018-02-26 (×4): qty 1

## 2018-02-26 MED ORDER — HEPARIN (PORCINE) IN NACL 2-0.9 UNITS/ML
INTRAMUSCULAR | Status: AC | PRN
  Administered 2018-02-26 (×2): 500 mL

## 2018-02-26 MED ORDER — HEPARIN (PORCINE) IN NACL 1000-0.9 UT/500ML-% IV SOLN
INTRAVENOUS | Status: AC
  Filled 2018-02-26: qty 1000

## 2018-02-26 MED ORDER — ASPIRIN 81 MG PO CHEW: 324.0000 mg | CHEWABLE_TABLET | ORAL | Status: AC

## 2018-02-26 MED ORDER — FENTANYL CITRATE (PF) 100 MCG/2ML IJ SOLN
INTRAMUSCULAR | Status: AC
  Filled 2018-02-26: qty 2

## 2018-02-26 MED ORDER — MIDAZOLAM HCL 2 MG/2ML IJ SOLN
INTRAMUSCULAR | Status: AC
  Filled 2018-02-26: qty 2

## 2018-02-26 MED ORDER — LIDOCAINE HCL (PF) 1 % IJ SOLN
INTRAMUSCULAR | Status: DC | PRN
  Administered 2018-02-26: 2 mL

## 2018-02-26 MED ORDER — ADULT MULTIVITAMIN W/MINERALS CH
1.0000 | ORAL_TABLET | Freq: Every day | ORAL | Status: DC
  Administered 2018-02-27 – 2018-03-04 (×5): 1 via ORAL
  Filled 2018-02-26 (×5): qty 1

## 2018-02-26 MED ORDER — HEPARIN SODIUM (PORCINE) 1000 UNIT/ML IJ SOLN
INTRAMUSCULAR | Status: AC
  Filled 2018-02-26: qty 1

## 2018-02-26 MED ORDER — IOHEXOL 350 MG/ML SOLN
INTRAVENOUS | Status: DC | PRN
  Administered 2018-02-26: 70 mL

## 2018-02-26 MED ORDER — HEPARIN SODIUM (PORCINE) 1000 UNIT/ML IJ SOLN
INTRAMUSCULAR | Status: DC | PRN
  Administered 2018-02-26: 4000 [IU] via INTRAVENOUS

## 2018-02-26 MED ORDER — METOPROLOL TARTRATE 12.5 MG HALF TABLET
12.5000 mg | ORAL_TABLET | Freq: Two times a day (BID) | ORAL | Status: DC
  Administered 2018-02-26 – 2018-03-03 (×8): 12.5 mg via ORAL
  Filled 2018-02-26 (×7): qty 1

## 2018-02-26 MED ORDER — SODIUM CHLORIDE 0.9 % IV SOLN: INTRAVENOUS | Status: AC

## 2018-02-26 MED ORDER — LIDOCAINE HCL (PF) 1 % IJ SOLN
INTRAMUSCULAR | Status: AC
  Filled 2018-02-26: qty 30

## 2018-02-26 MED ORDER — PRAVASTATIN SODIUM 40 MG PO TABS
40.0000 mg | ORAL_TABLET | Freq: Every day | ORAL | Status: DC
  Administered 2018-02-26 – 2018-03-07 (×10): 40 mg via ORAL
  Filled 2018-02-26 (×10): qty 1

## 2018-02-26 MED ORDER — SODIUM CHLORIDE 0.9 % IV SOLN
250.0000 mL | INTRAVENOUS | Status: DC | PRN
  Administered 2018-02-27: 250 mL via INTRAVENOUS

## 2018-02-26 MED ORDER — HEPARIN (PORCINE) IN NACL 100-0.45 UNIT/ML-% IJ SOLN
800.0000 [IU]/h | INTRAMUSCULAR | Status: DC
  Administered 2018-02-26: 600 [IU]/h via INTRAVENOUS
  Administered 2018-02-28 – 2018-03-01 (×2): 800 [IU]/h via INTRAVENOUS
  Filled 2018-02-26 (×4): qty 250

## 2018-02-26 MED ORDER — NITROGLYCERIN IN D5W 200-5 MCG/ML-% IV SOLN
0.0000 ug/min | INTRAVENOUS | Status: DC
  Administered 2018-02-26: 15 ug/min via INTRAVENOUS

## 2018-02-26 MED ORDER — ASPIRIN 300 MG RE SUPP: 300.0000 mg | RECTAL | Status: AC

## 2018-02-26 MED ORDER — HEPARIN (PORCINE) IN NACL 100-0.45 UNIT/ML-% IJ SOLN
600.0000 [IU]/h | INTRAMUSCULAR | Status: DC
  Administered 2018-02-26: 600 [IU]/h via INTRAVENOUS
  Filled 2018-02-26: qty 250

## 2018-02-26 MED ORDER — NITROGLYCERIN IN D5W 200-5 MCG/ML-% IV SOLN
0.0000 ug/min | INTRAVENOUS | Status: DC
  Administered 2018-02-26: 5 ug/min via INTRAVENOUS
  Filled 2018-02-26: qty 250

## 2018-02-26 SURGICAL SUPPLY — 9 items
CATH OPTITORQUE TIG 4.0 5F (CATHETERS) ×1 IMPLANT
DEVICE RAD TR BAND REGULAR (VASCULAR PRODUCTS) ×1 IMPLANT
GLIDESHEATH SLEND A-KIT 6F 22G (SHEATH) ×1 IMPLANT
GUIDEWIRE INQWIRE 1.5J.035X260 (WIRE) IMPLANT
INQWIRE 1.5J .035X260CM (WIRE) ×2
KIT HEART LEFT (KITS) ×2 IMPLANT
PACK CARDIAC CATHETERIZATION (CUSTOM PROCEDURE TRAY) ×2 IMPLANT
TRANSDUCER W/STOPCOCK (MISCELLANEOUS) ×2 IMPLANT
TUBING CIL FLEX 10 FLL-RA (TUBING) ×2 IMPLANT

## 2018-02-26 NOTE — ED Provider Notes (Signed)
[facility] EMERGENCY DEPARTMENT Provider Note   CSN: [id number] Arrival date & time: 02/26/18  0715     History   Chief Complaint Chief Complaint  Patient presents with  . Chest Pain    HPI Deborah [name] is a 72 y.o. female.  HPI   Deborah [name] is a 72 y.o. female, with a history of hypercholesterolemia, presenting to the ED with chest pain.  States she had an episode of chest pain yesterday that lasted for 2 to 3 hours and did not resolve with rest. Chest pain is central chest, varies from 5/10 to 8/10 in intensity, "feels like a small elephant," radiating to the back and into the bilateral upper extremities.  Has never had this type of pain before.  Accompanied by shortness of breath due to heaviness. States she was able to go to sleep last night, however, around 6 AM this morning she began to feel the chest discomfort again.  Denies fever/chills, cough, abdominal pain, dizziness, N/V/D, syncope, diaphoresis, lower extremity swelling or pain, or any other complaints.   Past Medical History:  Diagnosis Date  . High cholesterol   . Thyroid disease     Patient Active Problem List   Diagnosis Date Noted  . Unstable angina ([place]) 02/26/2018  . Hyperlipemia 02/26/2018    Past Surgical History:  Procedure Laterality Date  . LAPAROSCOPIC OOPHERECTOMY    . TENDON REPAIR       OB History   None      Home Medications    Prior to Admission medications   Medication Sig Start Date End Date Taking? Authorizing Provider  Calcium Carb-Cholecalciferol (CALCIUM+D3 PO) Take 1 tablet by mouth 2 (two) times daily after a meal.   Yes [provider]  Cholecalciferol (VITAMIN D-3) 1000 units CAPS Take 1,000 Units by mouth at bedtime.   Yes [provider]  methimazole (TAPAZOLE) 5 MG tablet Take 5 mg by mouth See admin instructions. Take 5 mg by mouth at bedtime on Mon/Tues/Wed/Thurs/Fri/Sat 01/12/18  Yes [provider]  Multiple  Vitamins-Calcium (ONE-A-DAY WOMENS FORMULA) TABS Take 1 tablet by mouth daily.   Yes [provider]  naproxen sodium (ALEVE) 220 MG tablet Take 220 mg by mouth daily with breakfast.   Yes [provider]  pravastatin (PRAVACHOL) 40 MG tablet Take 40 mg by mouth at bedtime. 11/09/11  Yes [provider]    Family History Family History  Problem Relation Age of Onset  . Osteoporosis Mother   . CAD Father        1st Mi around age 28  . Hypercholesterolemia Father   . Hypercholesterolemia Sister   . Multiple myeloma Brother     Social History Social History   Tobacco Use  . Smoking status: Not on file  Substance Use Topics  . Alcohol use: Not on file  . Drug use: Not on file     Allergies   Patient has no known allergies.   Review of Systems Review of Systems  Constitutional: Negative for chills, diaphoresis and fever.  Respiratory: Positive for shortness of breath. Negative for cough.   Cardiovascular: Positive for chest pain.  Gastrointestinal: Negative for abdominal pain, diarrhea, nausea and vomiting.  Musculoskeletal: Positive for back pain.  Neurological: Negative for weakness and numbness.  All other systems reviewed and are negative.    Physical Exam Updated Vital Signs BP (!) 168/80   Pulse 63   Temp (!) 97.5 F (36.4 C) (Oral)  Resp 12   SpO2 100%   Physical Exam  Constitutional: She appears well-developed and well-nourished. No distress.  HENT:  Head: Normocephalic and atraumatic.  Eyes: Conjunctivae are normal.  Neck: Neck supple.  Cardiovascular: Normal rate, regular rhythm, normal heart sounds and intact distal pulses.  Pulses:      Radial pulses are 2+ on the right side, and 2+ on the left side.       Dorsalis pedis pulses are 2+ on the right side, and 2+ on the left side.       Posterior tibial pulses are 2+ on the right side, and 2+ on the left side.  Pulmonary/Chest: Effort normal and breath sounds normal. No  respiratory distress.  No increased work of breathing.  Speaks in full sentences without difficulty.  Abdominal: Soft. There is no tenderness. There is no guarding.  Musculoskeletal: She exhibits no edema.  Lymphadenopathy:    She has no cervical adenopathy.  Neurological: She is alert.  Skin: Skin is warm and dry. She is not diaphoretic.  Psychiatric: She has a normal mood and affect. Her behavior is normal.  Nursing note and vitals reviewed.    ED Treatments / Results  Labs (all labs ordered are listed, but only abnormal results are displayed) Labs Reviewed  BASIC METABOLIC PANEL - Abnormal; Notable for the following components:      Result Value   Glucose, Bld 145 (*)    All other components within normal limits  TROPONIN I - Abnormal; Notable for the following components:   Troponin I 0.05 (*)    All other components within normal limits  CBC  HEPARIN LEVEL (UNFRACTIONATED)  I-STAT TROPONIN, ED    EKG EKG Interpretation  Date/Time:  Friday February 26 2018 07:21:37 EDT Ventricular Rate:  61 PR Interval:  134 QRS Duration: 86 QT Interval:  434 QTC Calculation: 436 R Axis:   90 Text Interpretation:  Normal sinus rhythm Right atrial enlargement Rightward axis Cannot rule out Anterior infarct , age undetermined Abnormal ECG No previous ECGs available Confirmed by Fredia Sorrow [phone]) on 02/26/2018 7:27:47 AM   Radiology Dg Chest 2 View  Result Date: 02/26/2018 CLINICAL DATA:  Chest pain.  Nausea. EXAM: CHEST - 2 VIEW COMPARISON:  Chest CTA today. Low-dose lung cancer screening chest CT 05/18/2017. FINDINGS: The cardiomediastinal silhouette is within normal limits. Aortic atherosclerosis is noted. The lungs are hyperinflated with a small amount of mildly increased posterior basilar density on the lateral radiograph, favored to represent subsegmental atelectasis given appearance on today's CTA. No pleural effusion or pneumothorax is identified. An old left clavicle fracture  is noted. IMPRESSION: Hyperinflation.  Mild bibasilar atelectasis. Electronically Signed   By: Logan Bores M.D.   On: 02/26/2018 09:32   Ct Angio Chest/abd/pel For Dissection W And/or Wo Contrast  Result Date: 02/26/2018 CLINICAL DATA:  Chest pressure today EXAM: CT ANGIOGRAPHY CHEST, ABDOMEN AND PELVIS TECHNIQUE: Multidetector CT imaging through the chest, abdomen and pelvis was performed using the standard protocol during bolus administration of intravenous contrast. Multiplanar reconstructed images and MIPs were obtained and reviewed to evaluate the vascular anatomy. CONTRAST:  155m ISOVUE-370 IOPAMIDOL (ISOVUE-370) INJECTION 76% COMPARISON:  05/18/2017 chest CT without contrast FINDINGS: CTA CHEST FINDINGS Cardiovascular: There is no evidence of aortic dissection or acute intramural hematoma. Maximal diameter of the ascending aorta is 3.7 cm. Atherosclerotic calcifications of the aortic arch and great vessels are noted. Moderate 3 vessel coronary artery calcification. Great vessels are patent. Vertebral arteries are patent there  is no obvious evidence of acute pulmonary thromboembolism. Mediastinum/Nodes: Visualized thyroid is unremarkable. No abnormal mediastinal adenopathy. Esophagus is within normal limits. Lungs/Pleura: No pneumothorax. No pleural effusion. Dependent atelectasis in the lungs. Musculoskeletal: No definite acute vertebral compression deformity. Scoliosis at the thoracolumbar junction is not significantly changed. This is associated with advanced degenerative disc disease. Review of the MIP images confirms the above findings. CTA ABDOMEN AND PELVIS FINDINGS VASCULAR Aorta: No evidence of aortic dissection or aneurysm. Scattered atherosclerotic calcifications are noted. Celiac: Patent. Branch vessels patent. Accessory left hepatic artery anatomy. SMA: Atherosclerotic calcifications are present at the origin. No significant focal narrowing. Replaced right hepatic artery. Renals: Single  renal arteries are patent. IMA: Origin is patent. There is significant narrowing 3 cm beyond its takeoff. Branch vessels are grossly patent. Inflow: There are atherosclerotic calcifications throughout the iliac arterial system but no significant narrowing in the common, internal, or external iliac arteries. Left common iliac artery is ectatic with a maximal caliber of 11 mm. Review of the MIP images confirms the above findings. NON-VASCULAR Hepatobiliary: Unremarkable Pancreas: Unremarkable Spleen: Unremarkable Adrenals/Urinary Tract: Adrenal glands are grossly within normal limits but there are poorly visualized. There is a simple cyst in the lower pole of the left kidney. Other hypodensities are too small to characterize. There is scarring in the left kidney. Right kidney is within normal limits. Bladder is decompressed. Stomach/Bowel: Sigmoid diverticulosis is present. There is no convincing evidence of acute diverticulitis. There is no obvious mass in the colon. No evidence of small-bowel obstruction. There is fluid and high-density material layering in the stomach. It is grossly within normal limits. Lymphatic: No abnormal retroperitoneal adenopathy. Reproductive: Uterus and adnexa are within normal limits. Other: No free fluid. Musculoskeletal: Thoracolumbar scoliosis. No vertebral compression deformity. Review of the MIP images confirms the above findings. IMPRESSION: Vascular: No evidence of aortic dissection.  No acute vascular pathology. There is narrowing in the IMA, 3 cm beyond its takeoff. Celiac and SMA are patent. Nonvascular: No acute process in the chest, abdomen, or pelvis. Aortic Atherosclerosis (ICD10-I70.0). Electronically Signed   By: Marybelle Killings M.D.   On: 02/26/2018 09:28    Procedures .Critical Care Performed by: Lorayne Bender, PA-C Authorized by: Lorayne Bender, PA-C   Critical care provider statement:    Critical care time (minutes):  35   Critical care time was exclusive of:   Separately billable procedures and treating other patients   Critical care was necessary to treat or prevent imminent or life-threatening deterioration of the following conditions: Unstable angina; NSTEMI.   Critical care was time spent personally by me on the following activities:  Obtaining history from patient or surrogate, discussions with consultants, development of treatment plan with patient or surrogate, evaluation of patient's response to treatment, examination of patient, re-evaluation of patient's condition, pulse oximetry, ordering and review of radiographic studies, ordering and review of laboratory studies and ordering and performing treatments and interventions   I assumed direction of critical care for this patient from another provider in my specialty: no     (including critical care time)  Medications Ordered in ED Medications  nitroGLYCERIN (NITROSTAT) SL tablet 0.4 mg (0.4 mg Sublingual Given 02/26/18 0910)  heparin ADULT infusion 100 units/mL (25000 units/278m sodium chloride 0.45%) (600 Units/hr Intravenous New Bag/Given 02/26/18 1023)  nitroGLYCERIN 50 mg in dextrose 5 % 250 mL (0.2 mg/mL) infusion (10 mcg/min Intravenous Rate/Dose Change 02/26/18 1152)  metoprolol tartrate (LOPRESSOR) tablet 12.5 mg (has no administration in time range)  aspirin chewable tablet 324 mg (324 mg Oral Given 02/26/18 0839)  iopamidol (ISOVUE-370) 76 % injection (100 mLs  Contrast Given 02/26/18 0857)  heparin bolus via infusion 2,000 Units (2,000 Units Intravenous Bolus from Bag 02/26/18 1024)     Initial Impression / Assessment and Plan / ED Course  I have reviewed the triage vital signs and the nursing notes.  Pertinent labs & imaging results that were available during my care of the patient were reviewed by me and considered in my medical decision making (see chart for details).  Clinical Course as of Feb 27 1152  Fri Feb 26, 2018  0940 Discussed imaging results with the patient.  States  she is pain-free following 2 nitroglycerin doses.  Denies additional complaints.   [SJ]  1000 Spoke with Trish, Water engineer.   [SJ]  [place] with Daune Perch, NP with cardiology. States they will be taking the patient to the Cath Lab.   [SJ]    Clinical Course User Index [SJ] Rogenia Werntz C, PA-C     Patient presents with 2 episodes of chest pain.  Still had active chest pain upon presentation this morning. HEART score is 5, indicating moderate risk for a cardiac event. Wells criteria score is 0, indicating low risk for PE.  Initial suspicion for unstable angina was high, however, due to the pattern of the patient's pain and its sudden onset rule out of dissection was warranted. CT negative for evidence of dissection or aneurysm.  Troponin I returned mildly positive, thus making patient a NSTEMI.  Pain resolved with nitroglycerin.  Cardiology evaluated patient and patient to be taken to Cath Lab.  Findings and plan of care discussed with Fredia Sorrow, MD. Dr. Rogene Houston personally evaluated and examined this patient.  Vitals:   02/26/18 0915 02/26/18 0930 02/26/18 0945 02/26/18 1000  BP: (!) 108/51 134/69 129/63   Pulse: 81 69 64   Resp: (!) _0 Temp:      TempSrc:      SpO2: 98% 100% 100%   Weight:    44.6 kg (98 lb 6.4 oz)  Height:    _1  (1.626 m)     Final Clinical Impressions(s) / ED Diagnoses   Final diagnoses:  NSTEMI (non-ST elevated myocardial infarction) [facility])    ED Discharge Orders    None       Layla Maw 02/26/18 1153    Fredia Sorrow, MD 02/26/18 1206

## 2018-02-26 NOTE — Consult Note (Addendum)
[address]       [place] [address]             [phone]        Deborah [name] [place] Medical Record #[id number] Date of Birth: 1946-01-16  Referring: No ref. provider found Primary Care: Deborah Redwood, MD Primary Cardiologist:Deborah Martinique, MD  Chief Complaint:    Chief Complaint  Patient presents with  . Chest Pain    History of Present Illness:    The patient is a 72 year old female who presented to the emergency department with symptoms of classic unstable angina.  Initial EKG was without evidence of acute STEMI.  He did have a mildly elevated troponin I of 0.05.  He developed chest pain approximately 48 hours prior to presentation.  The symptoms began last Wednesday evening when she took out her garbage and developed some shortness of breath and mild chest tightness which she has not experienced previously.  The symptoms did quickly resolve with stopping the activity.  The following day she developed central chest pressure and weakness on the backside of her arms as well as significant fatigue and mild shortness of breath while working in her garden.  She sat down and gain relief of symptoms after approximately 2 hours.  She had no further symptoms until the following morning when she again developed central chest pressure which radiated to the right arm and mid back along with aching in both arms mild, shortness of breath, nausea, lightheadedness and diaphoresis. She was brought to the emergency department and given sublingual  nitroglycerineand the symptoms resolved.  She was felt to require admission for further evaluation and treatment to include cardiology consultation.  Symptoms were felt to be very evident for unstable angina/acute coronary syndrome.  A CTA of the chest was also done and showed no evidence of aortic dissection or acute intramural hematoma.  She did have findings on the CT scan of atherosclerotic calcifications in the aortic arch and  great vessels and moderate three-vessel coronary artery calcification.  She was placed on intravenous heparin and cardiac catheterization was scheduled.  The results of this study are listed below and we are asked to see the patient in cardiothoracic surgical consultation for consideration of coronary artery surgical revascularization.  Current Activity/ Functional Status: Patient is independent with mobility/ambulation, transfers, ADL's, IADL's.   Zubrod Score: At the time of surgery this patient's most appropriate activity status/level should be described as: _0     0    Normal activity, no symptoms _1     1    Restricted in physical strenuous activity but ambulatory, able to do out light work _2     2    Ambulatory and capable of self care, unable to do work activities, up and about                 more than 50%  Of the time                            _3     3    Only limited self care, in bed greater than 50% of waking hours _4     4    Completely disabled, no self care, confined to bed or chair _5     5    Moribund  Past Medical History:  Diagnosis Date  . High cholesterol   . Thyroid disease     Past Surgical History:  Procedure Laterality Date  . LAPAROSCOPIC OOPHERECTOMY    . TENDON REPAIR    Rotator cuff x 3 ACL l knee  Social History   Tobacco Use  Smoking Status Not on file    Social History   Substance and Sexual Activity  Alcohol Use Not on file     No Known Allergies  Current Facility-Administered Medications  Medication Dose Route Frequency Provider Last Rate Last Dose  . heparin ADULT infusion 100 units/mL (25000 units/232m sodium chloride 0.45%)  600 Units/hr Intravenous Continuous WLyndee Leo RPH      . [MAR Hold] metoprolol tartrate (LOPRESSOR) tablet 12.5 mg  12.5 mg Oral BID HDaune Perch NP      . [Doug SouHold] nitroGLYCERIN (NITROSTAT) SL tablet 0.4 mg  0.4 mg Sublingual Q5 min PRN Joy, Shawn C, PA-C   0.4 mg at 02/26/18 0910  . [MAR Hold]  nitroGLYCERIN 50 mg in dextrose 5 % 250 mL (0.2 mg/mL) infusion  0-200 mcg/min Intravenous Titrated HDaune Perch NP 3 mL/hr at 02/26/18 1152 10 mcg/min at 02/26/18 1152    Medications Prior to Admission  Medication Sig Dispense Refill Last Dose  . Calcium Carb-Cholecalciferol (CALCIUM+D3 PO) Take 1 tablet by mouth 2 (two) times daily after a meal.   02/26/2018 at Unknown time  . Cholecalciferol (VITAMIN D-3) 1000 units CAPS Take 1,000 Units by mouth at bedtime.   02/25/2018 at pm  . methimazole (TAPAZOLE) 5 MG tablet Take 5 mg by mouth See admin instructions. Take 5 mg by mouth at bedtime on Mon/Tues/Wed/Thurs/Fri/Sat  3 02/25/2018 at pm  . Multiple Vitamins-Calcium (ONE-A-DAY WOMENS FORMULA) TABS Take 1 tablet by mouth daily.   02/26/2018 at Unknown time  . naproxen sodium (ALEVE) 220 MG tablet Take 220 mg by mouth daily with breakfast.   02/26/2018 at Unknown time  . pravastatin (PRAVACHOL) 40 MG tablet Take 40 mg by mouth at bedtime.   02/25/2018 at pm    Family History  Problem Relation Age of Onset  . Osteoporosis Mother   . CAD Father        1st Mi around age 73 . Hypercholesterolemia Father   . Hypercholesterolemia Sister   . Multiple myeloma Brother      Review of Systems:   Review of Systems  Constitutional: Positive for diaphoresis. Negative for chills, fever, malaise/fatigue and weight loss.  HENT: Negative for congestion, ear discharge, ear pain, hearing loss, nosebleeds, sinus pain, sore throat and tinnitus.   Eyes: Negative.   Respiratory: Positive for shortness of breath. Negative for cough, hemoptysis, sputum production, wheezing and stridor.   Cardiovascular: Positive for chest pain. Negative for palpitations, orthopnea, claudication, leg swelling and PND.  Gastrointestinal: Positive for nausea. Negative for abdominal pain, blood in stool, constipation, diarrhea, heartburn, melena and vomiting.  Genitourinary: Negative.   Musculoskeletal: Positive for back pain,  joint pain and myalgias. Negative for falls and neck pain.  Skin: Negative for itching and rash.  Neurological: Positive for tingling and sensory change. Negative for dizziness, tremors, speech change, focal weakness, seizures, loss of consciousness, weakness and headaches.       Left hand carpal tunnel sx Shoulder weakness during chest pain and chronic issues d/t rotator cuffs  Endo/Heme/Allergies: Negative for environmental allergies and polydipsia. Does not bruise/bleed easily.  Psychiatric/Behavioral: Negative for depression, hallucinations, memory loss, substance abuse and suicidal ideas. The patient is not nervous/anxious and does not have insomnia.       Physical Exam: BP 139/74   Pulse (Marland Kitchen  59   Temp (!) 97.5 F (36.4 C) (Oral)   Resp 12   Ht _0  (1.626 m)   Wt 44.6 kg (98 lb 6.4 oz)   SpO2 99%   BMI 16.89 kg/m    Physical Exam  Constitutional: She appears healthy. No distress.  HENT:  Mouth/Throat: Dentition is normal. No dental caries. Oropharynx is clear. Pharynx is normal.  Eyes: Pupils are equal, round, and reactive to light. Conjunctivae are normal.  Neck: Normal range of motion and thyroid normal. Neck supple. No JVD present. No neck adenopathy. No thyromegaly present.  Cardiovascular: Regular rhythm, normal heart sounds and intact distal pulses. Exam reveals no gallop.  No murmur heard. Pulmonary/Chest: Effort normal and breath sounds normal. No stridor. She has no wheezes. She has no rales. She exhibits no tenderness.  Abdominal: Soft. Bowel sounds are normal. She exhibits distension. She exhibits no mass. There is no hepatomegaly. There is no tenderness.  Musculoskeletal: She exhibits no edema, tenderness or deformity.  Neurological: She is alert and oriented to person, place, and time. She has normal motor skills.  Skin: Skin is warm and dry. No rash noted. No cyanosis. No jaundice or pallor. Nails show no clubbing.      Diagnostic Studies & Laboratory  data:     Recent Radiology Findings:   Dg Chest 2 View  Result Date: 02/26/2018 CLINICAL DATA:  Chest pain.  Nausea. EXAM: CHEST - 2 VIEW COMPARISON:  Chest CTA today. Low-dose lung cancer screening chest CT 05/18/2017. FINDINGS: The cardiomediastinal silhouette is within normal limits. Aortic atherosclerosis is noted. The lungs are hyperinflated with a small amount of mildly increased posterior basilar density on the lateral radiograph, favored to represent subsegmental atelectasis given appearance on today's CTA. No pleural effusion or pneumothorax is identified. An old left clavicle fracture is noted. IMPRESSION: Hyperinflation.  Mild bibasilar atelectasis. Electronically Signed   By: Logan Bores M.D.   On: 02/26/2018 09:32   Ct Angio Chest/abd/pel For Dissection W And/or Wo Contrast  Result Date: 02/26/2018 CLINICAL DATA:  Chest pressure today EXAM: CT ANGIOGRAPHY CHEST, ABDOMEN AND PELVIS TECHNIQUE: Multidetector CT imaging through the chest, abdomen and pelvis was performed using the standard protocol during bolus administration of intravenous contrast. Multiplanar reconstructed images and MIPs were obtained and reviewed to evaluate the vascular anatomy. CONTRAST:  161m ISOVUE-370 IOPAMIDOL (ISOVUE-370) INJECTION 76% COMPARISON:  05/18/2017 chest CT without contrast FINDINGS: CTA CHEST FINDINGS Cardiovascular: There is no evidence of aortic dissection or acute intramural hematoma. Maximal diameter of the ascending aorta is 3.7 cm. Atherosclerotic calcifications of the aortic arch and great vessels are noted. Moderate 3 vessel coronary artery calcification. Great vessels are patent. Vertebral arteries are patent there is no obvious evidence of acute pulmonary thromboembolism. Mediastinum/Nodes: Visualized thyroid is unremarkable. No abnormal mediastinal adenopathy. Esophagus is within normal limits. Lungs/Pleura: No pneumothorax. No pleural effusion. Dependent atelectasis in the lungs.  Musculoskeletal: No definite acute vertebral compression deformity. Scoliosis at the thoracolumbar junction is not significantly changed. This is associated with advanced degenerative disc disease. Review of the MIP images confirms the above findings. CTA ABDOMEN AND PELVIS FINDINGS VASCULAR Aorta: No evidence of aortic dissection or aneurysm. Scattered atherosclerotic calcifications are noted. Celiac: Patent. Branch vessels patent. Accessory left hepatic artery anatomy. SMA: Atherosclerotic calcifications are present at the origin. No significant focal narrowing. Replaced right hepatic artery. Renals: Single renal arteries are patent. IMA: Origin is patent. There is significant narrowing 3 cm beyond its takeoff. Branch vessels are grossly patent.  Inflow: There are atherosclerotic calcifications throughout the iliac arterial system but no significant narrowing in the common, internal, or external iliac arteries. Left common iliac artery is ectatic with a maximal caliber of 11 mm. Review of the MIP images confirms the above findings. NON-VASCULAR Hepatobiliary: Unremarkable Pancreas: Unremarkable Spleen: Unremarkable Adrenals/Urinary Tract: Adrenal glands are grossly within normal limits but there are poorly visualized. There is a simple cyst in the lower pole of the left kidney. Other hypodensities are too small to characterize. There is scarring in the left kidney. Right kidney is within normal limits. Bladder is decompressed. Stomach/Bowel: Sigmoid diverticulosis is present. There is no convincing evidence of acute diverticulitis. There is no obvious mass in the colon. No evidence of small-bowel obstruction. There is fluid and high-density material layering in the stomach. It is grossly within normal limits. Lymphatic: No abnormal retroperitoneal adenopathy. Reproductive: Uterus and adnexa are within normal limits. Other: No free fluid. Musculoskeletal: Thoracolumbar scoliosis. No vertebral compression deformity.  Review of the MIP images confirms the above findings. IMPRESSION: Vascular: No evidence of aortic dissection.  No acute vascular pathology. There is narrowing in the IMA, 3 cm beyond its takeoff. Celiac and SMA are patent. Nonvascular: No acute process in the chest, abdomen, or pelvis. Aortic Atherosclerosis (ICD10-I70.0). Electronically Signed   By: Marybelle Killings M.D.   On: 02/26/2018 09:28     I have independently reviewed the above radiologic studies and discussed with the patient   Recent Lab Findings: Lab Results  Component Value Date   WBC 6.5 02/26/2018   HGB 13.0 02/26/2018   HCT 40.1 02/26/2018   PLT 230 02/26/2018   GLUCOSE 145 (H) 02/26/2018   NA 137 02/26/2018   K 3.7 02/26/2018   CL 103 02/26/2018   CREATININE 0.72 02/26/2018   BUN 18 02/26/2018   CO2 24 02/26/2018   Deborah Man, MD (Primary)    Procedures   LEFT HEART CATH AND CORONARY ANGIOGRAPHY  Conclusion     Culprit lesion: Mid RCA lesion is 95% stenosed. Heavily calcified irregular lesion  Prox RCA to Mid RCA lesion is 25% stenosed. Dist RCA lesion is 45% stenosed.  Prox LAD-1 lesion is 85% stenosed. - Prox LAD-2 lesion is 55% stenosed. - Prox LAD-3 lesion is 70% stenosed. (tandem lesions)  The left ventricular systolic function is normal. The left ventricular ejection fraction is greater than 65% by visual estimate.  LV end diastolic pressure is normal.  There is mild (2+) mitral regurgitation.   Heavily calcified coronary arteries with severe mid RCA 99% stenosis and diffuse 70 to 80% ostial-proximal LAD stenosis. Normal LV function and EDP.  Patient has severe disease.  We will consult CT surgery.  She will be admitted to the stepdown unit.  Placed on IV heparin.  If necessary IV nitroglycerin.  Continue risk factor modification - aspirin, statin, beta-blocker etc.      Deborah [name], M.D., M.S. Interventional Cardiologist   Pager # [phone] Phone # [phone] [address] [place], [place] [address]    Indications   Non-ST elevation (NSTEMI) myocardial infarction [facility]) [I21.4 (ICD-10-CM)]  Procedural Details/Technique   Technical Details Primary Care Provider: Marton Redwood, MD Primary Cardiologist: Deborah Martinique, MD   Deborah [name] is a pleasant 72 yo WF presenting to [facility] significant with 48 hour history classic for unstable angina. Risk factors of HLD and family history of early CAD. Chest CT showed Three-vessel calcification. She was seen in the emergency room by Dr. Martinique,  who referred the patient for cardiac catheterization.   Time Out: Verified patient identification, verified procedure, site/side was marked, verified correct patient position, special equipment/implants available, medications/allergies/relevent history reviewed, required imaging and test results available. Performed. Consent Signed.   Access:  * Right Radial Artery: 6 Fr sheath -- Seldinger technique using Angiocath Micropuncture Kit * 10 mL radial cocktail IA; 4000 Units IV Heparin  Left Heart Catheterization: 5Fr TIG 4.0 Catheter was advanced or exchanged over a J-wire under direct fluoroscopic guidance into the ascending aorta and used for left ventricular hemodynamics, LV gram and left and right coronary cineangiography.  Initial angiography was reviewed with Dr. Martinique --showed severe calcified disease noted in the RCA and LAD. We felt that the best course of action will be to proceed with CT surgical consultation for bypass surgery.  - After completion of angiography, the catheter was removed completely out of the body over wire without complication.  Radial Sheath(s) removed in the Cath Lab with TR Band placed for hemostasis.   TR Band: 1330 Hours; 16 mL air  MEDICATIONS * SQ Lidocaine 61m * Radial Cocktail: 3 mg Verapamil in 10 mL NS * Isovue Contrast: 70 mL * Heparin: 4000 Units  Fluoro time: 3 minutes. Dose Area Product: [id number] mGycm2. Cumulative Air Kerma: 358 mGy.   Estimated blood loss <50 mL.  During this procedure the patient was administered the following to achieve and maintain moderate conscious sedation: Versed 1 mg, Fentanyl 25 mcg, while the patient's heart rate, blood pressure, and oxygen saturation were continuously monitored. The period of conscious sedation was 26 minutes, of which I was present face-to-face 100% of this time.  Complications   Complications documented before study signed (02/26/2018 2:35 PM EDT)    No complications were associated with this study.  Documented by HLeonie Man MD - 02/26/2018 1:52 PM EDT    Coronary Findings   Diagnostic  Dominance: Right  Left Anterior Descending  There is moderate diffuse disease throughout the vessel. The vessel is moderately calcified. The vessel is moderately tortuous.  Prox LAD-1 lesion 85% stenosed  Prox LAD-1 lesion is 85% stenosed. The lesion is type C, eccentric and irregular. The lesion is moderately calcified.  Prox LAD-2 lesion 55% stenosed  Prox LAD-2 lesion is 55% stenosed. The lesion is irregular. The lesion is calcified. Diffuse  Prox LAD-3 lesion 70% stenosed  Prox LAD-3 lesion is 70% stenosed. The lesion is type C, located at the major branch, focal, eccentric and irregular.  First Diagonal Branch  Vessel is small in size.  First Septal Branch  Vessel is small in size.  Second Diagonal Branch  Vessel is small in size.  Left Circumflex  There is mild diffuse disease throughout the vessel.  First Obtuse Marginal Branch  There is mild disease in the vessel.  Right Coronary Artery  Vessel is large. There is mild diffuse disease throughout the vessel. There is mild focal disease in the vessel. The vessel is severely calcified.  Prox RCA to Mid RCA lesion 25% stenosed  Prox RCA to Mid RCA lesion is 25% stenosed. The lesion is moderately calcified.  Mid RCA lesion 95% stenosed  Mid RCA lesion is 95% stenosed. Vessel is  the culprit lesion. The lesion is type C, located at the bend, irregular and ulcerative. The lesion is severely calcified. TIMI 2 flow distally  Dist RCA lesion 45% stenosed  Dist RCA lesion is 45% stenosed. The lesion is located proximal to the major branch. The lesion  is calcified. swelling flow  Acute Marginal Branch  Vessel is small in size.  Inferior Septal  Vessel is small in size.  First Right Posterolateral  Vessel is small in size.  Second Right Posterolateral  Vessel is moderate in size.  Third Right Posterolateral  Vessel is moderate in size. There is mild disease in the vessel. The vessel is tortuous.  Intervention   No interventions have been documented.  Wall Motion              Left Heart   Left Ventricle The left ventricular size is normal. The left ventricular systolic function is normal. LV end diastolic pressure is normal. The left ventricular ejection fraction is greater than 65% by visual estimate. No regional wall motion abnormalities. There is mild (2+) mitral regurgitation.  Coronary Diagrams   Diagnostic Diagram         I have independently reviewed the above  cath films and reviewed the findings with the  patient .    Assessment / Plan:  Severely calcified two-vessel coronary artery disease not suitable for angioplasty/stent-with the patient's unstable anginal symptoms and severe two-vessel coronary artery disease coronary artery bypass grafting has been recommended to the patient.  I reviewed with her and her husband the Cath Lab findings echo and CT findings proceeding with coronary artery bypass graft. Hold ACE inhibitor until that time  Tentatively plan for surgery on Tuesday, July 2    Deborah Isaac MD      [address] [place] [address] Office [phone]   [place]

## 2018-02-26 NOTE — Progress Notes (Signed)
ANTICOAGULATION CONSULT NOTE - Initial Consult  Pharmacy Consult for heparin Indication: chest pain/ACS  No Known Allergies  Patient Measurements: Height: 5\' 4"  (162.6 cm) Weight: 98 lb 6.4 oz (44.6 kg) IBW/kg (Calculated) : 54.7 Heparin Dosing Weight: 44.6kg   Vital Signs: Temp: 97.5 F (36.4 C) (06/28 0733) Temp Source: Oral (06/28 0733) BP: 129/63 (06/28 0945) Pulse Rate: 64 (06/28 0945)  Labs: Recent Labs    02/26/18 0739  HGB 13.0  HCT 40.1  PLT 230  CREATININE 0.72    Estimated Creatinine Clearance: 44.1 mL/min (by C-G formula based on SCr of 0.72 mg/dL).  Assessment: 60 YOF here with chest pain. She is not on any anticoagulation PTA, but does tell me she takes 1 Aleve daily. Baseline CBC wnl. Patient does not report any unusual bleeding lately- states she has bruises from moving heavy items around her home, but she can explain all of them and nothing is out of the ordinary.  Goal of Therapy:  Heparin level 0.3-0.7 units/ml Monitor platelets by anticoagulation protocol: Yes   Plan:  Heparin bolus with 2000 units IV x1, then start infusion at 600 units/hr Heparin level in 8h Daily heparin level and CBC Follow cardiology plans  Zakaree Mcclenahan D. Erskin Zinda, PharmD, BCPS Clinical Pharmacist [phone] Please check AMION for all [facility] numbers 02/26/2018 10:07 AM

## 2018-02-26 NOTE — Progress Notes (Signed)
[place] for heparin Indication: chest pain/ACS  No Known Allergies  Patient Measurements: Height: 5\' 4"  (162.6 cm) Weight: 98 lb 6.4 oz (44.6 kg) IBW/kg (Calculated) : 54.7 Heparin Dosing Weight: 44.6kg   Vital Signs: Temp: 97.5 F (36.4 C) (06/28 0733) Temp Source: Oral (06/28 0733) BP: 145/78 (06/28 1331) Pulse Rate: 0 (06/28 1340)  Labs: Recent Labs    02/26/18 0739 02/26/18 0903  HGB 13.0  --   HCT 40.1  --   PLT 230  --   CREATININE 0.72  --   TROPONINI  --  0.05*    Estimated Creatinine Clearance: 44.1 mL/min (by C-G formula based on SCr of 0.72 mg/dL).  Assessment: 58 YOF here with chest pain. She is not on any anticoagulation PTA, but does tell me she takes 1 Aleve daily. Baseline CBC wnl.   S/p cath this afternoon, found to have severe CAD involving RCA and LAD. CT surgery to be consulted. Heparin to restart tonight.   Goal of Therapy:  Heparin level 0.3-0.7 units/ml Monitor platelets by anticoagulation protocol: Yes   Plan:  Restart heparin at 600 units/hr tonight at 2130 Heparin level in 8h Daily heparin level and CBC Follow cardiology/surgery plans  Erin Hearing PharmD., BCPS Clinical Pharmacist 02/26/2018 2:46 PM  Please check AMION for all [facility] numbers

## 2018-02-26 NOTE — ED Triage Notes (Signed)
Pt reports CP that began yesterday afternoon, pt states she was working outside and began feeling hot, nauseated, sob and had heaviness in her chest. States symptoms improved and this am she began having nausea, heaviness in her chest "like an elephant sitting on it" sob and pain in her arms. Pt a/ox4, resp e/u, nad. Skin warm and dry, still reports pain in her chest but states it has lessened.

## 2018-02-26 NOTE — Interval H&P Note (Signed)
History and Physical Interval Note:  02/26/2018 12:41 PM  Deborah [name]  has presented today for surgery, with the diagnosis of unstable angina.   The various methods of treatment have been discussed with the patient and family. After consideration of risks, benefits and other options for treatment, the patient has consented to  Procedure(s): LEFT HEART CATH AND CORONARY ANGIOGRAPHY (N/A) with possible PERCUTANEOUS CORONARY INTERVENTION as a surgical intervention .  The patient's history has been reviewed, patient examined, no change in status, stable for surgery.  I have reviewed the patient's chart and labs.  Questions were answered to the patient's satisfaction.    Cath Lab Visit (complete for each Cath Lab visit)  Clinical Evaluation Leading to the Procedure:   ACS: Yes.    Non-ACS:    Anginal Classification: CCS IV  Anti-ischemic medical therapy: Minimal Therapy (1 class of medications)  Non-Invasive Test Results: No non-invasive testing performed  Prior CABG: No previous CABG   Glenetta Hew

## 2018-02-26 NOTE — H&P (Signed)
Cardiology Admission History and Physical:   Patient ID: Deborah [name]; MRN: [id number]; DOB: 31-Aug-1946   Admission date: 02/26/2018  Primary Care Provider: Marton Redwood, MD Primary Cardiologist: Deborah Martinique, MD   Chief Complaint: Chest pain  Patient Profile:   Deborah [name] is a 72 y.o. female with a history of hyperthyroidism and hyperlipidemia. Presented for chest pain very suspicious for angina.    History of Present Illness:   Deborah [name] is a very lovely lady.  She is married and lives with her husband who has non-Hodgkin's lymphoma and she has been caring for him.  They have no children.  They are in the process of moving from their home to [facility].  She has been very busy moving things.  She had been in her usual state of health until Wednesday night while taking out the garbage can to the street she noticed shortness of breath and mild chest tightness which she does not usually experience.  This resolved when she stopped the activity.  Yesterday she was outside working in the yard and developed central chest pressure and weakness on the back sides of her arms and total exhaustion with mild shortness of breath.  She went inside and sat down.  Her symptoms resolved after about 2 hours.  For the rest of the day she was okay with no further symptoms.  This morning she awoke around 6 AM and while preparing breakfast she developed central chest pressure that radiated to right shoulder and mid back along with aching of both arms, mild shortness of breath, nausea, lightheadedness and diaphoresis.  She denied palpitations.  She was to take her husband to [facility] for his cancer treatment and thought she could make it there and seek treatment if her pain continued, however once in the car she felt like her symptoms were too significant and she came to the [facility] ED.  She was given nitroglycerin sublingually and her symptoms resolved.  Since arrival here she has had one  recurrent episode of chest pain that was resolved after 2 sublingual nitroglycerin.  She is currently chest pain-free.  Deborah [name] denies any past cardiac history or testing.  She does have a history of hyperlipidemia treated with pravastatin and followed at her PCP.  She denies hypertension, diabetes, stroke.  She does not do any regular exercise however she has been very active around her house, she drives, goes shopping, does housework and yard work.  Until this episode she had had no exertional symptoms.  She was a prior smoker for about 30 years beginning in her early 54s up to her 84s.  She drinks about 1 vodka drink per day or a glass of wine.   Significant findings: -EKG shows Normal sinus rhythm, 61 bpm, Right atrial enlargement, Rightward axis, late R wave progression -First point-of-care troponin 0 0.05 which is in normal range <0.08 -Electrolytes are normal except for elevated glucose.  Serum creatinine is normal.  Hemoglobin is normal -CTA of the chest shows no evidence of aortic dissection or acute intramural hematoma.  There are atherosclerotic calcifications of the aortic arch and great vessels.  Moderate three-vessel coronary artery calcification. -Chest x-ray shows hyperinflation, mild bibasilar atelectasis  Past Medical History:  Diagnosis Date  . High cholesterol   . Thyroid disease     Past Surgical History:  Procedure Laterality Date  . LAPAROSCOPIC OOPHERECTOMY    . TENDON REPAIR       Medications Prior to Admission: Prior  to Admission medications   Medication Sig Start Date End Date Taking? Authorizing Provider  Calcium Carb-Cholecalciferol (CALCIUM+D3 PO) Take 1 tablet by mouth 2 (two) times daily after a meal.   Yes [provider]  Cholecalciferol (VITAMIN D-3) 1000 units CAPS Take 1,000 Units by mouth at bedtime.   Yes [provider]  methimazole (TAPAZOLE) 5 MG tablet Take 5 mg by mouth See admin instructions. Take 5 mg by mouth at bedtime  on Mon/Tues/Wed/Thurs/Fri/Sat 01/12/18  Yes [provider]  Multiple Vitamins-Calcium (ONE-A-DAY WOMENS FORMULA) TABS Take 1 tablet by mouth daily.   Yes [provider]  naproxen sodium (ALEVE) 220 MG tablet Take 220 mg by mouth daily with breakfast.   Yes [provider]  pravastatin (PRAVACHOL) 40 MG tablet Take 40 mg by mouth at bedtime. 11/09/11  Yes [provider]     Allergies:   No Known Allergies  Social History:   Social History   Socioeconomic History  . Marital status: Married    Spouse name: Not on file  . Number of children: Not on file  . Years of education: Not on file  . Highest education level: Not on file  Occupational History  . Not on file  Social Needs  . Financial resource strain: Not on file  . Food insecurity:    Worry: Not on file    Inability: Not on file  . Transportation needs:    Medical: Not on file    Non-medical: Not on file  Tobacco Use  . Smoking status: Not on file  Substance and Sexual Activity  . Alcohol use: Not on file  . Drug use: Not on file  . Sexual activity: Not on file  Lifestyle  . Physical activity:    Days per week: Not on file    Minutes per session: Not on file  . Stress: Not on file  Relationships  . Social connections:    Talks on phone: Not on file    Gets together: Not on file    Attends religious service: Not on file    Active member of club or organization: Not on file    Attends meetings of clubs or organizations: Not on file    Relationship status: Not on file  . Intimate partner violence:    Fear of current or ex partner: Not on file    Emotionally abused: Not on file    Physically abused: Not on file    Forced sexual activity: Not on file  Other Topics Concern  . Not on file  Social History Narrative  . Not on file    Family History:   The patient's family history includes CAD in her father; Hypercholesterolemia in her father and sister; Multiple myeloma in her  brother; Osteoporosis in her mother.    ROS:  Please see the history of present illness.  All other ROS reviewed and negative.     Physical Exam/Data:   Vitals:   02/26/18 0930 02/26/18 0945 02/26/18 1000 02/26/18 1041  BP: 134/69 129/63    Pulse: 69 64  71  Resp: _0 Temp:      TempSrc:      SpO2: 100% 100%  100%  Weight:   98 lb 6.4 oz (44.6 kg)   Height:   _1  (1.626 m)    No intake or output data in the 24 hours ending 02/26/18 1140 Filed Weights   02/26/18 1000  Weight: 98 lb 6.4  oz (44.6 kg)   Body mass index is 16.89 kg/m.  General:  Well nourished, well developed, in no acute distress HEENT: normal Lymph: no adenopathy Neck: no JVD Endocrine:  No thryomegaly Vascular: No carotid bruits; FA pulses 2+ bilaterally without bruits  Cardiac:  normal S1, S2; RRR; no murmur  Lungs:  clear to auscultation bilaterally, no wheezing, rhonchi or rales  Abd: soft, nontender, no hepatomegaly  Ext: no edema Musculoskeletal:  No deformities, BUE and BLE strength normal and equal Skin: warm and dry  Neuro:  CNs 2-12 intact, no focal abnormalities noted Psych:  Normal affect    EKG:  The ECG that was done was personally reviewed and demonstrates Normal sinus rhythm, 61 bpm, Right atrial enlargement, Rightward axis, late R wave progression   Relevant CV Studies: none  Laboratory Data:  Chemistry Recent Labs  Lab 02/26/18 0739  NA 137  K 3.7  CL 103  CO2 24  GLUCOSE 145*  BUN 18  CREATININE 0.72  CALCIUM 9.3  GFRNONAA >60  GFRAA >60  ANIONGAP 10    No results for input(s): PROT, ALBUMIN, AST, ALT, ALKPHOS, BILITOT in the last 168 hours. Hematology Recent Labs  Lab 02/26/18 0739  WBC 6.5  RBC 4.10  HGB 13.0  HCT 40.1  MCV 97.8  MCH 31.7  MCHC 32.4  RDW 12.1  PLT 230   Cardiac Enzymes Recent Labs  Lab 02/26/18 0903  TROPONINI 0.05*    Recent Labs  Lab 02/26/18 0742  TROPIPOC 0.05    BNPNo results for input(s): BNP, PROBNP in the  last 168 hours.  DDimer No results for input(s): DDIMER in the last 168 hours.  Radiology/Studies:  Dg Chest 2 View  Result Date: 02/26/2018 CLINICAL DATA:  Chest pain.  Nausea. EXAM: CHEST - 2 VIEW COMPARISON:  Chest CTA today. Low-dose lung cancer screening chest CT 05/18/2017. FINDINGS: The cardiomediastinal silhouette is within normal limits. Aortic atherosclerosis is noted. The lungs are hyperinflated with a small amount of mildly increased posterior basilar density on the lateral radiograph, favored to represent subsegmental atelectasis given appearance on today's CTA. No pleural effusion or pneumothorax is identified. An old left clavicle fracture is noted. IMPRESSION: Hyperinflation.  Mild bibasilar atelectasis. Electronically Signed   By: Logan Bores M.D.   On: 02/26/2018 09:32   Ct Angio Chest/abd/pel For Dissection W And/or Wo Contrast  Result Date: 02/26/2018 CLINICAL DATA:  Chest pressure today EXAM: CT ANGIOGRAPHY CHEST, ABDOMEN AND PELVIS TECHNIQUE: Multidetector CT imaging through the chest, abdomen and pelvis was performed using the standard protocol during bolus administration of intravenous contrast. Multiplanar reconstructed images and MIPs were obtained and reviewed to evaluate the vascular anatomy. CONTRAST:  166m ISOVUE-370 IOPAMIDOL (ISOVUE-370) INJECTION 76% COMPARISON:  05/18/2017 chest CT without contrast FINDINGS: CTA CHEST FINDINGS Cardiovascular: There is no evidence of aortic dissection or acute intramural hematoma. Maximal diameter of the ascending aorta is 3.7 cm. Atherosclerotic calcifications of the aortic arch and great vessels are noted. Moderate 3 vessel coronary artery calcification. Great vessels are patent. Vertebral arteries are patent there is no obvious evidence of acute pulmonary thromboembolism. Mediastinum/Nodes: Visualized thyroid is unremarkable. No abnormal mediastinal adenopathy. Esophagus is within normal limits. Lungs/Pleura: No pneumothorax. No  pleural effusion. Dependent atelectasis in the lungs. Musculoskeletal: No definite acute vertebral compression deformity. Scoliosis at the thoracolumbar junction is not significantly changed. This is associated with advanced degenerative disc disease. Review of the MIP images confirms the above findings. CTA ABDOMEN AND PELVIS FINDINGS VASCULAR Aorta:  No evidence of aortic dissection or aneurysm. Scattered atherosclerotic calcifications are noted. Celiac: Patent. Branch vessels patent. Accessory left hepatic artery anatomy. SMA: Atherosclerotic calcifications are present at the origin. No significant focal narrowing. Replaced right hepatic artery. Renals: Single renal arteries are patent. IMA: Origin is patent. There is significant narrowing 3 cm beyond its takeoff. Branch vessels are grossly patent. Inflow: There are atherosclerotic calcifications throughout the iliac arterial system but no significant narrowing in the common, internal, or external iliac arteries. Left common iliac artery is ectatic with a maximal caliber of 11 mm. Review of the MIP images confirms the above findings. NON-VASCULAR Hepatobiliary: Unremarkable Pancreas: Unremarkable Spleen: Unremarkable Adrenals/Urinary Tract: Adrenal glands are grossly within normal limits but there are poorly visualized. There is a simple cyst in the lower pole of the left kidney. Other hypodensities are too small to characterize. There is scarring in the left kidney. Right kidney is within normal limits. Bladder is decompressed. Stomach/Bowel: Sigmoid diverticulosis is present. There is no convincing evidence of acute diverticulitis. There is no obvious mass in the colon. No evidence of small-bowel obstruction. There is fluid and high-density material layering in the stomach. It is grossly within normal limits. Lymphatic: No abnormal retroperitoneal adenopathy. Reproductive: Uterus and adnexa are within normal limits. Other: No free fluid. Musculoskeletal:  Thoracolumbar scoliosis. No vertebral compression deformity. Review of the MIP images confirms the above findings. IMPRESSION: Vascular: No evidence of aortic dissection.  No acute vascular pathology. There is narrowing in the IMA, 3 cm beyond its takeoff. Celiac and SMA are patent. Nonvascular: No acute process in the chest, abdomen, or pelvis. Aortic Atherosclerosis (ICD10-I70.0). Electronically Signed   By: Marybelle Killings M.D.   On: 02/26/2018 09:28    Assessment and Plan:   Unstable angina -Symptoms are very suspicious for ACS with new onset of exertional chest pain with associated symptoms, relieved with nitroglycerin, but has been recurrent. -CVD risk factors include age, hyperlipidemia, remote smoking, family history -First point of care troponin is 0.05 which is in normal range less than 0.08, second serum troponin I is mildly elevated at 0.05 -EKG: Normal sinus rhythm, 61 bpm, Right atrial enlargement, Rightward axis, late R wave progression.  No acute ischemic changes -Electrolytes are normal except for elevated glucose.  Renal function is normal. -CTA of the chest shows no evidence of aortic dissection or acute intramural hematoma.  There are atherosclerotic calcifications of the aortic arch and great vessels.  Moderate three-vessel coronary artery calcification. -Chest x-ray shows hyperinflation, mild bibasilar atelectasis -IV heparin is infusing -The patient's symptoms are very suspicious for unstable angina.  I discussed cardiac catheterization with the patient. The patient understands that risks included but are not limited to stroke (1 in 1000), death (1 in 55), kidney failure [usually temporary] (1 in 500), bleeding (1 in 200), allergic reaction [possibly serious] (1 in 200). She is agreeable. -She last ate at 630 this morning: 1 cup of coffee and a few bites of granola  Hyperlipidemia -On pravastatin 40 mg daily.  No lipid results found in epic or through care everywhere.   Followed by her PCP at [facility] -We will continue her home pravastatin.  Check lipid panel in the morning.  She may need switch to high intensity statin, atorvastatin or Crestor, based on cath results.  Hyperthyroidism -Followed at [facility], on methimazole.  Last office visit was on 06/16/2017-TSH 2.756.  We will continue her home regimen.  Severity of Illness: The appropriate patient status  for this patient is OBSERVATION. Observation status is judged to be reasonable and necessary in order to provide the required intensity of service to ensure the patient's safety. The patient's presenting symptoms, physical exam findings, and initial radiographic and laboratory data in the context of their medical condition is felt to place them at decreased risk for further clinical deterioration. Furthermore, it is anticipated that the patient will be medically stable for discharge from the hospital within 2 midnights of admission. The following factors support the patient status of observation.   " The patient's presenting symptoms include chest pain, shortness of breath. " The physical exam findings include normal exam. " The initial radiographic and laboratory data are elevated troponin, EKG without acute ischemia, CTA of the chest without aortic dissection but does show multivessel coronary calcifications, chest x-ray without acute findings.     For questions or updates, please contact [place] Please consult [email or web address] for contact info under Cardiology/STEMI.    Signed, Daune Perch, NP  02/26/2018 11:40 AM

## 2018-02-26 NOTE — ED Notes (Signed)
MD notified that the pt chest pain is increasing since arrival. Pt now rates it a 7/10 radiating around to the back.

## 2018-02-27 ENCOUNTER — Inpatient Hospital Stay (HOSPITAL_COMMUNITY): Payer: Medicare Other

## 2018-02-27 DIAGNOSIS — I5030 Unspecified diastolic (congestive) heart failure: Secondary | ICD-10-CM

## 2018-02-27 LAB — ECHOCARDIOGRAM COMPLETE
Height: 64 in
Weight: 1675.2 oz

## 2018-02-27 LAB — BASIC METABOLIC PANEL
ANION GAP: 6 (ref 5–15)
BUN: 13 mg/dL (ref 8–23)
CALCIUM: 8.8 mg/dL — AB (ref 8.9–10.3)
CO2: 27 mmol/L (ref 22–32)
Chloride: 104 mmol/L (ref 98–111)
Creatinine, Ser: 0.72 mg/dL (ref 0.44–1.00)
Glucose, Bld: 99 mg/dL (ref 70–99)
Potassium: 4.2 mmol/L (ref 3.5–5.1)
SODIUM: 137 mmol/L (ref 135–145)

## 2018-02-27 LAB — LIPID PANEL
Cholesterol: 161 mg/dL (ref 0–200)
HDL: 64 mg/dL (ref >40)
LDL Cholesterol: 88 mg/dL (ref 0–99)
Total CHOL/HDL Ratio: 2.5 RATIO
Triglycerides: 45 mg/dL (ref <150)
VLDL: 9 mg/dL (ref 0–40)

## 2018-02-27 LAB — HEPARIN LEVEL (UNFRACTIONATED)
HEPARIN UNFRACTIONATED: 0.17 [IU]/mL — AB (ref 0.30–0.70)
HEPARIN UNFRACTIONATED: 0.32 [IU]/mL (ref 0.30–0.70)

## 2018-02-27 LAB — CBC
HCT: 35.8 % — ABNORMAL LOW (ref 36.0–46.0)
Hemoglobin: 11.7 g/dL — ABNORMAL LOW (ref 12.0–15.0)
MCH: 31.8 pg (ref 26.0–34.0)
MCHC: 32.7 g/dL (ref 30.0–36.0)
MCV: 97.3 fL (ref 78.0–100.0)
PLATELETS: 214 10*3/uL (ref 150–400)
RBC: 3.68 MIL/uL — ABNORMAL LOW (ref 3.87–5.11)
RDW: 12.2 % (ref 11.5–15.5)
WBC: 6.9 10*3/uL (ref 4.0–10.5)

## 2018-02-27 LAB — MRSA PCR SCREENING: MRSA BY PCR: NEGATIVE

## 2018-02-27 MED ORDER — LISINOPRIL 2.5 MG PO TABS
2.5000 mg | ORAL_TABLET | Freq: Every day | ORAL | Status: DC
  Administered 2018-02-27 – 2018-02-28 (×2): 2.5 mg via ORAL
  Filled 2018-02-27 (×2): qty 1

## 2018-02-27 NOTE — Progress Notes (Signed)
ANTICOAGULATION CONSULT NOTE - Follow Up Consult  Pharmacy Consult for heparin Indication: CAD awaiting possible CABG  Labs: Recent Labs    02/26/18 0739 02/26/18 0903 02/27/18 0518  HGB 13.0  --  11.7*  HCT 40.1  --  35.8*  PLT 230  --  214  HEPARINUNFRC  --   --  0.17*  CREATININE 0.72  --  0.72  TROPONINI  --  0.05*  --     Assessment: 72yo female subtherapeutic on heparin after started post-cath; no gtt issues or signs of bleeding overnight per RN.  Goal of Therapy:  Heparin level 0.3-0.7 units/ml   Plan:  Will increase heparin gtt by 3 units/kg/hr to 750 units/hr and check level in 8 hours.    Wynona Neat, PharmD, BCPS  02/27/2018,6:42 AM

## 2018-02-27 NOTE — Progress Notes (Signed)
Progress Note  Patient Name: Deborah [name] Date of Encounter: 02/27/2018  Primary Cardiologist: Peter Martinique, MD   Subjective   No complaints  Inpatient Medications    Scheduled Meds: . aspirin  324 mg Oral NOW   Or  . aspirin  300 mg Rectal NOW  . aspirin EC  81 mg Oral Daily  . methimazole  5 mg Oral Once per day on Mon Tue Wed Thu Fri Sat  . metoprolol tartrate  12.5 mg Oral BID  . multivitamin with minerals  1 tablet Oral Daily  . pravastatin  40 mg Oral QHS  . sodium chloride flush  3 mL Intravenous Q12H   Continuous Infusions: . sodium chloride    . heparin 750 Units/hr (02/27/18 0651)  . nitroGLYCERIN 5 mcg/min (02/27/18 0616)   PRN Meds: sodium chloride, acetaminophen, nitroGLYCERIN, ondansetron (ZOFRAN) IV, sodium chloride flush   Vital Signs    Vitals:   02/26/18 2208 02/27/18 0027 02/27/18 0520 02/27/18 0826  BP:  114/72 115/64 137/83  Pulse: 71 68 65 68  Resp:  20 18   Temp:  98.7 F (37.1 C) 98.4 F (36.9 C) 97.8 F (36.6 C)  TempSrc:  Oral Oral Oral  SpO2:  96% 96% 100%  Weight:   104 lb 11.2 oz (47.5 kg)   Height:        Intake/Output Summary (Last 24 hours) at 02/27/2018 0902 Last data filed at 02/27/2018 0533 Gross per 24 hour  Intake 91.96 ml  Output -  Net 91.96 ml   Filed Weights   02/26/18 1000 02/27/18 0520  Weight: 98 lb 6.4 oz (44.6 kg) 104 lb 11.2 oz (47.5 kg)    Telemetry    NSR - Personally Reviewed  ECG    na - Personally Reviewed  Physical Exam   GEN: No acute distress.   Neck: No JVD Cardiac: RRR, 2/6 systolic murmur at apex, rubs, or gallops.  Respiratory: Clear to auscultation bilaterally. GI: Soft, nontender, non-distended  MS: No edema; No deformity. Neuro:  Nonfocal  Psych: Normal affect   Labs    Chemistry Recent Labs  Lab 02/26/18 0739 02/27/18 0518  NA 137 137  K 3.7 4.2  CL 103 104  CO2 24 27  GLUCOSE 145* 99  BUN 18 13  CREATININE 0.72 0.72  CALCIUM 9.3 8.8*  GFRNONAA >60 >60    GFRAA >60 >60  ANIONGAP 10 6     Hematology Recent Labs  Lab 02/26/18 0739 02/27/18 0518  WBC 6.5 6.9  RBC 4.10 3.68*  HGB 13.0 11.7*  HCT 40.1 35.8*  MCV 97.8 97.3  MCH 31.7 31.8  MCHC 32.4 32.7  RDW 12.1 12.2  PLT 230 214    Cardiac Enzymes Recent Labs  Lab 02/26/18 0903  TROPONINI 0.05*    Recent Labs  Lab 02/26/18 0742  TROPIPOC 0.05     BNPNo results for input(s): BNP, PROBNP in the last 168 hours.   DDimer No results for input(s): DDIMER in the last 168 hours.   Radiology    Dg Chest 2 View  Result Date: 02/26/2018 CLINICAL DATA:  Chest pain.  Nausea. EXAM: CHEST - 2 VIEW COMPARISON:  Chest CTA today. Low-dose lung cancer screening chest CT 05/18/2017. FINDINGS: The cardiomediastinal silhouette is within normal limits. Aortic atherosclerosis is noted. The lungs are hyperinflated with a small amount of mildly increased posterior basilar density on the lateral radiograph, favored to represent subsegmental atelectasis given appearance on today's CTA. No pleural effusion  or pneumothorax is identified. An old left clavicle fracture is noted. IMPRESSION: Hyperinflation.  Mild bibasilar atelectasis. Electronically Signed   By: Logan Bores M.D.   On: 02/26/2018 09:32   Ct Angio Chest/abd/pel For Dissection W And/or Wo Contrast  Result Date: 02/26/2018 CLINICAL DATA:  Chest pressure today EXAM: CT ANGIOGRAPHY CHEST, ABDOMEN AND PELVIS TECHNIQUE: Multidetector CT imaging through the chest, abdomen and pelvis was performed using the standard protocol during bolus administration of intravenous contrast. Multiplanar reconstructed images and MIPs were obtained and reviewed to evaluate the vascular anatomy. CONTRAST:  142mL ISOVUE-370 IOPAMIDOL (ISOVUE-370) INJECTION 76% COMPARISON:  05/18/2017 chest CT without contrast FINDINGS: CTA CHEST FINDINGS Cardiovascular: There is no evidence of aortic dissection or acute intramural hematoma. Maximal diameter of the ascending aorta is  3.7 cm. Atherosclerotic calcifications of the aortic arch and great vessels are noted. Moderate 3 vessel coronary artery calcification. Great vessels are patent. Vertebral arteries are patent there is no obvious evidence of acute pulmonary thromboembolism. Mediastinum/Nodes: Visualized thyroid is unremarkable. No abnormal mediastinal adenopathy. Esophagus is within normal limits. Lungs/Pleura: No pneumothorax. No pleural effusion. Dependent atelectasis in the lungs. Musculoskeletal: No definite acute vertebral compression deformity. Scoliosis at the thoracolumbar junction is not significantly changed. This is associated with advanced degenerative disc disease. Review of the MIP images confirms the above findings. CTA ABDOMEN AND PELVIS FINDINGS VASCULAR Aorta: No evidence of aortic dissection or aneurysm. Scattered atherosclerotic calcifications are noted. Celiac: Patent. Natalio Salois vessels patent. Accessory left hepatic artery anatomy. SMA: Atherosclerotic calcifications are present at the origin. No significant focal narrowing. Replaced right hepatic artery. Renals: Single renal arteries are patent. IMA: Origin is patent. There is significant narrowing 3 cm beyond its takeoff. Lillar Bianca vessels are grossly patent. Inflow: There are atherosclerotic calcifications throughout the iliac arterial system but no significant narrowing in the common, internal, or external iliac arteries. Left common iliac artery is ectatic with a maximal caliber of 11 mm. Review of the MIP images confirms the above findings. NON-VASCULAR Hepatobiliary: Unremarkable Pancreas: Unremarkable Spleen: Unremarkable Adrenals/Urinary Tract: Adrenal glands are grossly within normal limits but there are poorly visualized. There is a simple cyst in the lower pole of the left kidney. Other hypodensities are too small to characterize. There is scarring in the left kidney. Right kidney is within normal limits. Bladder is decompressed. Stomach/Bowel: Sigmoid  diverticulosis is present. There is no convincing evidence of acute diverticulitis. There is no obvious mass in the colon. No evidence of small-bowel obstruction. There is fluid and high-density material layering in the stomach. It is grossly within normal limits. Lymphatic: No abnormal retroperitoneal adenopathy. Reproductive: Uterus and adnexa are within normal limits. Other: No free fluid. Musculoskeletal: Thoracolumbar scoliosis. No vertebral compression deformity. Review of the MIP images confirms the above findings. IMPRESSION: Vascular: No evidence of aortic dissection.  No acute vascular pathology. There is narrowing in the IMA, 3 cm beyond its takeoff. Celiac and SMA are patent. Nonvascular: No acute process in the chest, abdomen, or pelvis. Aortic Atherosclerosis (ICD10-I70.0). Electronically Signed   By: Marybelle Killings M.D.   On: 02/26/2018 09:28    Cardiac Studies    Patient Profile     Deborah [name] is a 72 y.o. female with a history of hyperthyroidism and hyperlipidemia. Presented for chest pain very suspicious for angina.      Assessment & Plan    1. Unstable angina - CTA of the chest shows no evidence of aortic dissection or acute intramural hematoma.   - cath showd  mid RCA 95%, prox LAD 85%, 55%, 70% tandem lesions. LVEF>65% - echo pending - CT surgery consulted for CABG, to see today.  - medical therapy with ASA 81, hep gtt, lopressor 12.5mg  bid, prava 40, NG gtt. Start lisinopril 2.5mg  daily in setting of unstable angina/ACS - she reports side effects on more potent statins, continue prava  F/u CT surgery recs regarding candidacy for CABG>    For questions or updates, please contact [place] Please consult [email or web address] for contact info under Cardiology/STEMI.      Merrily Pew, MD  02/27/2018, 9:02 AM

## 2018-02-27 NOTE — Progress Notes (Signed)
  Echocardiogram 2D Echocardiogram has been performed.  Deborah [name] Deborah [name] 02/27/2018, 3:01 PM

## 2018-02-27 NOTE — Progress Notes (Signed)
ANTICOAGULATION CONSULT NOTE - Follow Up Consult  Pharmacy Consult for heparin Indication: CAD awaiting possible CABG  Labs: Recent Labs    02/26/18 0739 02/26/18 0903 02/27/18 0518 02/27/18 1500  HGB 13.0  --  11.7*  --   HCT 40.1  --  35.8*  --   PLT 230  --  214  --   HEPARINUNFRC  --   --  0.17* 0.32  CREATININE 0.72  --  0.72  --   TROPONINI  --  0.05*  --   --     Assessment: 72yo female therapeutic on heparin; no gtt issues or signs of bleeding overnight per RN. Patient on low end of therapeutic therefore will increase slightly  Goal of Therapy:  Heparin level 0.3-0.7 units/ml   Plan:  Will increase heparin gtt to 800 units/hr and check level in 8 hours.    Jeraldine Primeau A. Levada Dy, PharmD, [place] Pager: [phone] Please utilize Amion for appropriate phone number to reach the unit pharmacist ([facility])   02/27/2018,4:36 PM

## 2018-02-28 ENCOUNTER — Inpatient Hospital Stay (HOSPITAL_COMMUNITY): Payer: Medicare Other

## 2018-02-28 DIAGNOSIS — Z01810 Encounter for preprocedural cardiovascular examination: Secondary | ICD-10-CM

## 2018-02-28 LAB — CBC
HCT: 35 % — ABNORMAL LOW (ref 36.0–46.0)
Hemoglobin: 11.6 g/dL — ABNORMAL LOW (ref 12.0–15.0)
MCH: 32.4 pg (ref 26.0–34.0)
MCHC: 33.1 g/dL (ref 30.0–36.0)
MCV: 97.8 fL (ref 78.0–100.0)
PLATELETS: 202 10*3/uL (ref 150–400)
RBC: 3.58 MIL/uL — AB (ref 3.87–5.11)
RDW: 12.4 % (ref 11.5–15.5)
WBC: 6.6 10*3/uL (ref 4.0–10.5)

## 2018-02-28 LAB — HEPARIN LEVEL (UNFRACTIONATED): Heparin Unfractionated: 0.47 IU/mL (ref 0.30–0.70)

## 2018-02-28 LAB — SURGICAL PCR SCREEN
MRSA, PCR: NEGATIVE
Staphylococcus aureus: NEGATIVE

## 2018-02-28 NOTE — Progress Notes (Addendum)
ANTICOAGULATION CONSULT NOTE - Follow Up Consult  Pharmacy Consult for heparin Indication: CAD awaiting possible CABG  Labs: Recent Labs    02/26/18 0739 02/26/18 0903 02/27/18 0518 02/27/18 1500 02/28/18 0130  HGB 13.0  --  11.7*  --  11.6*  HCT 40.1  --  35.8*  --  35.0*  PLT 230  --  214  --  202  HEPARINUNFRC  --   --  0.17* 0.32 0.47  CREATININE 0.72  --  0.72  --   --   TROPONINI  --  0.05*  --   --   --     Assessment: 72yo female therapeutic on heparin; no gtt issues or signs of bleeding overnight per RN.  Heparin level therapeutic at 0.49 units/ml  Goal of Therapy:  Heparin level 0.3-0.7 units/ml   Plan:  Continue heparin gtt  800 units/hr. Daily HL and CBC while on heparin Monitor for bleeding complications    Thanks for allowing pharmacy to be a part of this patient's care.  Excell Seltzer, PharmD Clinical Pharmacist  02/28/2018,3:01 AM

## 2018-02-28 NOTE — Progress Notes (Signed)
Progress Note  Patient Name: Deborah [name] Date of Encounter: 02/28/2018  Primary Cardiologist: Peter Martinique, MD   Subjective   No complaints  Inpatient Medications    Scheduled Meds: . aspirin EC  81 mg Oral Daily  . lisinopril  2.5 mg Oral Daily  . methimazole  5 mg Oral Once per day on Mon Tue Wed Thu Fri Sat  . metoprolol tartrate  12.5 mg Oral BID  . multivitamin with minerals  1 tablet Oral Daily  . pravastatin  40 mg Oral QHS  . sodium chloride flush  3 mL Intravenous Q12H   Continuous Infusions: . sodium chloride 10 mL/hr at 02/28/18 0548  . heparin 800 Units/hr (02/28/18 0548)  . nitroGLYCERIN 5 mcg/min (02/28/18 0548)   PRN Meds: sodium chloride, acetaminophen, nitroGLYCERIN, ondansetron (ZOFRAN) IV, sodium chloride flush   Vital Signs    Vitals:   02/27/18 2000 02/27/18 2140 02/28/18 0035 02/28/18 0429  BP: (!) 108/53 111/63 126/71 (!) 106/58  Pulse: 62 71 64 (!) 55  Resp:      Temp: 98.1 F (36.7 C)  98.2 F (36.8 C) 98.3 F (36.8 C)  TempSrc: Oral  Oral Oral  SpO2: 97%  99% 95%  Weight:    97 lb 8 oz (44.2 kg)  Height:        Intake/Output Summary (Last 24 hours) at 02/28/2018 0801 Last data filed at 02/28/2018 0548 Gross per 24 hour  Intake 1482.96 ml  Output -  Net 1482.96 ml   Filed Weights   02/26/18 1000 02/27/18 0520 02/28/18 0429  Weight: 98 lb 6.4 oz (44.6 kg) 104 lb 11.2 oz (47.5 kg) 97 lb 8 oz (44.2 kg)    Telemetry    SR - Personally Reviewed  ECG    na  Physical Exam   GEN: No acute distress.   Neck: No JVD Cardiac: RRR, no murmurs, rubs, or gallops.  Respiratory: Clear to auscultation bilaterally. GI: Soft, nontender, non-distended  MS: No edema; No deformity. Neuro:  Nonfocal  Psych: Normal affect   Labs    Chemistry Recent Labs  Lab 02/26/18 0739 02/27/18 0518  NA 137 137  K 3.7 4.2  CL 103 104  CO2 24 27  GLUCOSE 145* 99  BUN 18 13  CREATININE 0.72 0.72  CALCIUM 9.3 8.8*  GFRNONAA >60 >60    GFRAA >60 >60  ANIONGAP 10 6     Hematology Recent Labs  Lab 02/26/18 0739 02/27/18 0518 02/28/18 0130  WBC 6.5 6.9 6.6  RBC 4.10 3.68* 3.58*  HGB 13.0 11.7* 11.6*  HCT 40.1 35.8* 35.0*  MCV 97.8 97.3 97.8  MCH 31.7 31.8 32.4  MCHC 32.4 32.7 33.1  RDW 12.1 12.2 12.4  PLT 230 214 202    Cardiac Enzymes Recent Labs  Lab 02/26/18 0903  TROPONINI 0.05*    Recent Labs  Lab 02/26/18 0742  TROPIPOC 0.05     BNPNo results for input(s): BNP, PROBNP in the last 168 hours.   DDimer No results for input(s): DDIMER in the last 168 hours.   Radiology    Dg Chest 2 View  Result Date: 02/26/2018 CLINICAL DATA:  Chest pain.  Nausea. EXAM: CHEST - 2 VIEW COMPARISON:  Chest CTA today. Low-dose lung cancer screening chest CT 05/18/2017. FINDINGS: The cardiomediastinal silhouette is within normal limits. Aortic atherosclerosis is noted. The lungs are hyperinflated with a small amount of mildly increased posterior basilar density on the lateral radiograph, favored to represent subsegmental atelectasis  given appearance on today's CTA. No pleural effusion or pneumothorax is identified. An old left clavicle fracture is noted. IMPRESSION: Hyperinflation.  Mild bibasilar atelectasis. Electronically Signed   By: Logan Bores M.D.   On: 02/26/2018 09:32   Ct Angio Chest/abd/pel For Dissection W And/or Wo Contrast  Result Date: 02/26/2018 CLINICAL DATA:  Chest pressure today EXAM: CT ANGIOGRAPHY CHEST, ABDOMEN AND PELVIS TECHNIQUE: Multidetector CT imaging through the chest, abdomen and pelvis was performed using the standard protocol during bolus administration of intravenous contrast. Multiplanar reconstructed images and MIPs were obtained and reviewed to evaluate the vascular anatomy. CONTRAST:  133mL ISOVUE-370 IOPAMIDOL (ISOVUE-370) INJECTION 76% COMPARISON:  05/18/2017 chest CT without contrast FINDINGS: CTA CHEST FINDINGS Cardiovascular: There is no evidence of aortic dissection or acute  intramural hematoma. Maximal diameter of the ascending aorta is 3.7 cm. Atherosclerotic calcifications of the aortic arch and great vessels are noted. Moderate 3 vessel coronary artery calcification. Great vessels are patent. Vertebral arteries are patent there is no obvious evidence of acute pulmonary thromboembolism. Mediastinum/Nodes: Visualized thyroid is unremarkable. No abnormal mediastinal adenopathy. Esophagus is within normal limits. Lungs/Pleura: No pneumothorax. No pleural effusion. Dependent atelectasis in the lungs. Musculoskeletal: No definite acute vertebral compression deformity. Scoliosis at the thoracolumbar junction is not significantly changed. This is associated with advanced degenerative disc disease. Review of the MIP images confirms the above findings. CTA ABDOMEN AND PELVIS FINDINGS VASCULAR Aorta: No evidence of aortic dissection or aneurysm. Scattered atherosclerotic calcifications are noted. Celiac: Patent. Jia Mohamed vessels patent. Accessory left hepatic artery anatomy. SMA: Atherosclerotic calcifications are present at the origin. No significant focal narrowing. Replaced right hepatic artery. Renals: Single renal arteries are patent. IMA: Origin is patent. There is significant narrowing 3 cm beyond its takeoff. Lorin Gawron vessels are grossly patent. Inflow: There are atherosclerotic calcifications throughout the iliac arterial system but no significant narrowing in the common, internal, or external iliac arteries. Left common iliac artery is ectatic with a maximal caliber of 11 mm. Review of the MIP images confirms the above findings. NON-VASCULAR Hepatobiliary: Unremarkable Pancreas: Unremarkable Spleen: Unremarkable Adrenals/Urinary Tract: Adrenal glands are grossly within normal limits but there are poorly visualized. There is a simple cyst in the lower pole of the left kidney. Other hypodensities are too small to characterize. There is scarring in the left kidney. Right kidney is within  normal limits. Bladder is decompressed. Stomach/Bowel: Sigmoid diverticulosis is present. There is no convincing evidence of acute diverticulitis. There is no obvious mass in the colon. No evidence of small-bowel obstruction. There is fluid and high-density material layering in the stomach. It is grossly within normal limits. Lymphatic: No abnormal retroperitoneal adenopathy. Reproductive: Uterus and adnexa are within normal limits. Other: No free fluid. Musculoskeletal: Thoracolumbar scoliosis. No vertebral compression deformity. Review of the MIP images confirms the above findings. IMPRESSION: Vascular: No evidence of aortic dissection.  No acute vascular pathology. There is narrowing in the IMA, 3 cm beyond its takeoff. Celiac and SMA are patent. Nonvascular: No acute process in the chest, abdomen, or pelvis. Aortic Atherosclerosis (ICD10-I70.0). Electronically Signed   By: Marybelle Killings M.D.   On: 02/26/2018 09:28    Cardiac Studies     Patient Profile     Deborah [name] a 72 y.o.femalewith a history of hyperthyroidism and hyperlipidemia.Presentedfor chest pain very suspicious for angina.    Assessment & Plan    1. Unstable angina - CTA of the chest shows no evidence of aortic dissection or acute intramural hematoma.  - cath  showd mid RCA 95%, prox LAD 85%, 55%, 70% tandem lesions. LVEF>65% - echo LVEF 15-40%, grade I diastolic dysfunction - CT surgery consulted for CABG, to see today.  - medical therapy with ASA 81, hep gtt, lopressor 12.5mg  bid, prava 40, NG gtt. Start lisinopril 2.5mg  daily in setting of unstable angina/ACS - she reports side effects on more potent statins, continue prava  - no chest pain over 48 hours, on low dose NG gtt. WIll try weaning off per her request.   F/u CT surgery recs regarding candidacy for CABG    For questions or updates, please contact [place] Please consult [email or web address] for contact info under Cardiology/STEMI.       Merrily Pew, MD  02/28/2018, 8:01 AM

## 2018-02-28 NOTE — Progress Notes (Signed)
ANTICOAGULATION CONSULT NOTE - Follow Up Consult  Pharmacy Consult for Heparin Indication: CAD  No Known Allergies  Patient Measurements: Height: 5\' 4"  (162.6 cm) Weight: 97 lb 8 oz (44.2 kg) IBW/kg (Calculated) : 54.7 Heparin Dosing Weight:    Vital Signs: Temp: 98.1 F (36.7 C) (06/30 1213) Temp Source: Oral (06/30 1213) BP: 143/71 (06/30 1213) Pulse Rate: 64 (06/30 1213)  Labs: Recent Labs    02/26/18 0739 02/26/18 0903 02/27/18 0518 02/27/18 1500 02/28/18 0130  HGB 13.0  --  11.7*  --  11.6*  HCT 40.1  --  35.8*  --  35.0*  PLT 230  --  214  --  202  HEPARINUNFRC  --   --  0.17* 0.32 0.47  CREATININE 0.72  --  0.72  --   --   TROPONINI  --  0.05*  --   --   --     Estimated Creatinine Clearance: 43.7 mL/min (by C-G formula based on SCr of 0.72 mg/dL).   Assessment: Anticoag: Heparin s/p cath, awaiting CVTS consult. HL 0.47. CBC stable.  Goal of Therapy:  Heparin level 0.3-0.7 units/ml Monitor platelets by anticoagulation protocol: Yes   Plan:  Continue IV heparin 800 units/hr Daily heparin level and CBC   Deborah [name] S. Alford Highland, PharmD, BCPS Clinical Staff Pharmacist Pager [phone]  Deborah [name] 02/28/2018,12:31 PM

## 2018-02-28 NOTE — Progress Notes (Signed)
Pre-op Cardiac Surgery  Carotid Findings:  Findings suggest 1-39% internal carotid artery stenosis bilaterally. Vertebral arteries are patent with antegrade flow.  Upper Extremity Right Left  Brachial Pressures Triphasic 143-Triphasic  Radial Waveforms Triphasic Triphasic  Ulnar Waveforms Triphasic Triphasic  Palmar Arch (Allen's Test) Signal obliterates with radial compression, is unaffected with ulnar compression. Signal obliterates with radial compression, is unaffected with ulnar compression.    Lower  Extremity Right Left  Dorsalis Pedis Triphasic Triphasic  Posterior Tibial Triphasic Triphasic    Findings:   Bilateral pedal arteries are within normal limits at rest.  02/28/2018 3:34 PM Maudry Mayhew, BS, RVT, RDCS, RDMS

## 2018-03-01 ENCOUNTER — Encounter (HOSPITAL_COMMUNITY): Payer: Self-pay | Admitting: Cardiology

## 2018-03-01 ENCOUNTER — Inpatient Hospital Stay (HOSPITAL_COMMUNITY): Payer: Medicare Other

## 2018-03-01 DIAGNOSIS — I2510 Atherosclerotic heart disease of native coronary artery without angina pectoris: Secondary | ICD-10-CM

## 2018-03-01 LAB — COMPREHENSIVE METABOLIC PANEL
ALT: 17 U/L (ref 0–44)
AST: 21 U/L (ref 15–41)
Albumin: 3.7 g/dL (ref 3.5–5.0)
Alkaline Phosphatase: 40 U/L (ref 38–126)
Anion gap: 7 (ref 5–15)
BUN: 11 mg/dL (ref 8–23)
CO2: 27 mmol/L (ref 22–32)
Calcium: 9 mg/dL (ref 8.9–10.3)
Chloride: 103 mmol/L (ref 98–111)
Creatinine, Ser: 0.69 mg/dL (ref 0.44–1.00)
GFR calc Af Amer: >60 mL/min (ref >60)
GFR calc non Af Amer: >60 mL/min (ref >60)
Glucose, Bld: 94 mg/dL (ref 70–99)
Potassium: 4 mmol/L (ref 3.5–5.1)
Sodium: 137 mmol/L (ref 135–145)
Total Bilirubin: 0.7 mg/dL (ref 0.3–1.2)
Total Protein: 5.9 g/dL — ABNORMAL LOW (ref 6.5–8.1)

## 2018-03-01 LAB — CBC
HEMATOCRIT: 38.4 % (ref 36.0–46.0)
Hemoglobin: 12.7 g/dL (ref 12.0–15.0)
MCH: 32.3 pg (ref 26.0–34.0)
MCHC: 33.1 g/dL (ref 30.0–36.0)
MCV: 97.7 fL (ref 78.0–100.0)
Platelets: 221 10*3/uL (ref 150–400)
RBC: 3.93 MIL/uL (ref 3.87–5.11)
RDW: 12.1 % (ref 11.5–15.5)
WBC: 6.9 10*3/uL (ref 4.0–10.5)

## 2018-03-01 LAB — BLOOD GAS, ARTERIAL
ACID-BASE DEFICIT: 1.7 mmol/L (ref 0.0–2.0)
Bicarbonate: 22.3 mmol/L (ref 20.0–28.0)
DRAWN BY: 275531
FIO2: 21
O2 SAT: 96.9 %
PATIENT TEMPERATURE: 98.6
PH ART: 7.408 (ref 7.350–7.450)
pCO2 arterial: 36 mmHg (ref 32.0–48.0)
pO2, Arterial: 88.7 mmHg (ref 83.0–108.0)

## 2018-03-01 LAB — PULMONARY FUNCTION TEST
DL/VA % pred: 92 %
DL/VA: 4.42 ml/min/mmHg/L
DLCO cor % pred: 73 %
DLCO cor: 17.79 ml/min/mmHg
DLCO unc % pred: 71 %
DLCO unc: 17.39 ml/min/mmHg
FEF 25-75 Post: 2.51 L/sec
FEF 25-75 Pre: 1.74 L/sec
FEF2575-%Change-Post: 44 %
FEF2575-%Pred-Post: 144 %
FEF2575-%Pred-Pre: 99 %
FEV1-%Change-Post: 5 %
FEV1-%Pred-Post: 87 %
FEV1-%Pred-Pre: 82 %
FEV1-Post: 1.89 L
FEV1-Pre: 1.78 L
FEV1FVC-%Change-Post: 3 %
FEV1FVC-%Pred-Pre: 107 %
FEV6-%Change-Post: 0 %
FEV6-%Pred-Post: 79 %
FEV6-%Pred-Pre: 80 %
FEV6-Post: 2.18 L
FEV6-Pre: 2.2 L
FEV6FVC-%Pred-Post: 105 %
FEV6FVC-%Pred-Pre: 105 %
FVC-%Change-Post: 2 %
FVC-%Pred-Post: 78 %
FVC-%Pred-Pre: 76 %
FVC-Post: 2.26 L
FVC-Pre: 2.2 L
Post FEV1/FVC ratio: 83 %
Post FEV6/FVC ratio: 100 %
Pre FEV1/FVC ratio: 81 %
Pre FEV6/FVC Ratio: 100 %
RV % pred: 102 %
RV: 2.31 L
TLC % pred: 93 %
TLC: 4.73 L

## 2018-03-01 LAB — URINALYSIS, ROUTINE W REFLEX MICROSCOPIC
Bilirubin Urine: NEGATIVE
Glucose, UA: NEGATIVE mg/dL
Hgb urine dipstick: NEGATIVE
Ketones, ur: NEGATIVE mg/dL
Leukocytes, UA: NEGATIVE
Nitrite: NEGATIVE
Protein, ur: NEGATIVE mg/dL
Specific Gravity, Urine: 1.003 — ABNORMAL LOW (ref 1.005–1.030)
pH: 7 (ref 5.0–8.0)

## 2018-03-01 LAB — PROTIME-INR
INR: 1.13
Prothrombin Time: 14.4 seconds (ref 11.4–15.2)

## 2018-03-01 LAB — ABO/RH: ABO/RH(D): B POS

## 2018-03-01 LAB — HEPARIN LEVEL (UNFRACTIONATED): HEPARIN UNFRACTIONATED: 0.55 [IU]/mL (ref 0.30–0.70)

## 2018-03-01 MED ORDER — VANCOMYCIN HCL IN DEXTROSE 1-5 GM/200ML-% IV SOLN
1000.0000 mg | INTRAVENOUS | Status: AC
  Administered 2018-03-02: 1000 mg via INTRAVENOUS
  Filled 2018-03-01: qty 200

## 2018-03-01 MED ORDER — MILRINONE LACTATE IN DEXTROSE 20-5 MG/100ML-% IV SOLN
0.1250 ug/kg/min | INTRAVENOUS | Status: DC
  Filled 2018-03-01: qty 100

## 2018-03-01 MED ORDER — SODIUM CHLORIDE 0.9 % IV SOLN
1.5000 g | INTRAVENOUS | Status: AC
  Administered 2018-03-02: .75 g via INTRAVENOUS
  Administered 2018-03-02: 1.5 g via INTRAVENOUS
  Filled 2018-03-01: qty 1.5

## 2018-03-01 MED ORDER — SODIUM CHLORIDE 0.9 % IV SOLN
30.0000 ug/min | INTRAVENOUS | Status: AC
  Administered 2018-03-02: 15 ug/min via INTRAVENOUS
  Filled 2018-03-01: qty 20

## 2018-03-01 MED ORDER — MAGNESIUM SULFATE 50 % IJ SOLN
40.0000 meq | INTRAMUSCULAR | Status: DC
  Filled 2018-03-01: qty 9.85

## 2018-03-01 MED ORDER — CHLORHEXIDINE GLUCONATE CLOTH 2 % EX PADS
6.0000 | MEDICATED_PAD | Freq: Once | CUTANEOUS | Status: AC
  Administered 2018-03-01: 6 via TOPICAL

## 2018-03-01 MED ORDER — SODIUM CHLORIDE 0.9 % IV SOLN
750.0000 mg | INTRAVENOUS | Status: DC
  Filled 2018-03-01: qty 750

## 2018-03-01 MED ORDER — DEXMEDETOMIDINE HCL IN NACL 400 MCG/100ML IV SOLN
0.1000 ug/kg/h | INTRAVENOUS | Status: AC
  Administered 2018-03-02: .3 ug/kg/h via INTRAVENOUS
  Filled 2018-03-01: qty 100

## 2018-03-01 MED ORDER — TEMAZEPAM 15 MG PO CAPS: 15.0000 mg | ORAL_CAPSULE | Freq: Once | ORAL | Status: DC | PRN

## 2018-03-01 MED ORDER — DOPAMINE-DEXTROSE 3.2-5 MG/ML-% IV SOLN
0.0000 ug/kg/min | INTRAVENOUS | Status: DC
  Filled 2018-03-01: qty 250

## 2018-03-01 MED ORDER — METOPROLOL TARTRATE 12.5 MG HALF TABLET
12.5000 mg | ORAL_TABLET | Freq: Once | ORAL | Status: AC
  Administered 2018-03-02: 12.5 mg via ORAL
  Filled 2018-03-01: qty 1

## 2018-03-01 MED ORDER — CHLORHEXIDINE GLUCONATE CLOTH 2 % EX PADS
6.0000 | MEDICATED_PAD | Freq: Once | CUTANEOUS | Status: AC
  Administered 2018-03-02: 6 via TOPICAL

## 2018-03-01 MED ORDER — TRANEXAMIC ACID (OHS) BOLUS VIA INFUSION
15.0000 mg/kg | INTRAVENOUS | Status: AC
Start: 2018-03-02 — End: 2018-03-02
  Administered 2018-03-02: 661.5 mg via INTRAVENOUS
  Filled 2018-03-01: qty 662

## 2018-03-01 MED ORDER — ALBUTEROL SULFATE (2.5 MG/3ML) 0.083% IN NEBU
2.5000 mg | INHALATION_SOLUTION | Freq: Once | RESPIRATORY_TRACT | Status: AC
  Administered 2018-03-01: 2.5 mg via RESPIRATORY_TRACT

## 2018-03-01 MED ORDER — TRANEXAMIC ACID 1000 MG/10ML IV SOLN
1.5000 mg/kg/h | INTRAVENOUS | Status: AC
  Administered 2018-03-02: 1.5 mg/kg/h via INTRAVENOUS
  Filled 2018-03-01: qty 25

## 2018-03-01 MED ORDER — SODIUM CHLORIDE 0.9 % IV SOLN
INTRAVENOUS | Status: DC
  Filled 2018-03-01: qty 30

## 2018-03-01 MED ORDER — BISACODYL 5 MG PO TBEC
5.0000 mg | DELAYED_RELEASE_TABLET | Freq: Once | ORAL | Status: AC
  Administered 2018-03-01: 5 mg via ORAL
  Filled 2018-03-01: qty 1

## 2018-03-01 MED ORDER — TRANEXAMIC ACID (OHS) PUMP PRIME SOLUTION
2.0000 mg/kg | INTRAVENOUS | Status: DC
  Filled 2018-03-01: qty 0.88

## 2018-03-01 MED ORDER — EPINEPHRINE PF 1 MG/ML IJ SOLN
0.0000 ug/min | INTRAVENOUS | Status: DC
  Filled 2018-03-01: qty 4

## 2018-03-01 MED ORDER — POTASSIUM CHLORIDE 2 MEQ/ML IV SOLN
80.0000 meq | INTRAVENOUS | Status: DC
  Filled 2018-03-01: qty 40

## 2018-03-01 MED ORDER — PLASMA-LYTE 148 IV SOLN
INTRAVENOUS | Status: AC
Start: 2018-03-02 — End: 2018-03-02
  Administered 2018-03-02: 500 mL
  Filled 2018-03-01: qty 2.5

## 2018-03-01 MED ORDER — CHLORHEXIDINE GLUCONATE 0.12 % MT SOLN
15.0000 mL | Freq: Once | OROMUCOSAL | Status: AC
  Administered 2018-03-02: 15 mL via OROMUCOSAL
  Filled 2018-03-01: qty 15

## 2018-03-01 MED ORDER — SODIUM CHLORIDE 0.9 % IV SOLN
INTRAVENOUS | Status: AC
  Administered 2018-03-02: 2.3 [IU]/h via INTRAVENOUS
  Filled 2018-03-01: qty 1

## 2018-03-01 MED ORDER — NITROGLYCERIN IN D5W 200-5 MCG/ML-% IV SOLN
2.0000 ug/min | INTRAVENOUS | Status: DC
  Filled 2018-03-01: qty 250

## 2018-03-01 MED FILL — Heparin Sod (Porcine)-NaCl IV Soln 1000 Unit/500ML-0.9%: INTRAVENOUS | Qty: 500 | Status: AC

## 2018-03-01 NOTE — Progress Notes (Signed)
[id number] Gave pt care guide and in the tube handout. Pt already had [facility] booklet and IS. Wrote down how to view pre op video. Pt stated she can get to 2000 ml on IS. Discussed the importance of IS and mobility after surgery. Discussed sternal precautions. Discussed need to have someone with her 24/7 after discharge first week. Pt already asking questions re CRP 2. Discussed and pt wants referral after CABG. Will follow up. Brochure given. Graylon Good RN BSN 03/01/2018 2:33 PM

## 2018-03-01 NOTE — Progress Notes (Signed)
Patient had a nonsustained run of supraventricular tachycardia.  Patient asymptomatic.  Will continue to monitor.

## 2018-03-01 NOTE — Progress Notes (Signed)
ANTICOAGULATION CONSULT NOTE - Follow Up Consult  Pharmacy Consult for Heparin Indication: CAD  No Known Allergies  Patient Measurements: Height: 5\' 4"  (162.6 cm) Weight: 97 lb 4.8 oz (44.1 kg) IBW/kg (Calculated) : 54.7 Heparin Dosing Weight:    Vital Signs: Temp: 98.5 F (36.9 C) (07/01 0828) Temp Source: Oral (07/01 0828) BP: 117/56 (07/01 0828) Pulse Rate: 66 (07/01 0828)  Labs: Recent Labs    02/27/18 0518 02/27/18 1500 02/28/18 0130 03/01/18 0508  HGB 11.7*  --  11.6* 12.7  HCT 35.8*  --  35.0* 38.4  PLT 214  --  202 221  LABPROT  --   --   --  14.4  INR  --   --   --  1.13  HEPARINUNFRC 0.17* 0.32 0.47 0.55  CREATININE 0.72  --   --  0.69    Estimated Creatinine Clearance: 43.6 mL/min (by C-G formula based on SCr of 0.69 mg/dL).   Assessment 72 yo female on heparin s/p cath for CABG on 7/2 -HL= 0.55, CBC stable  Goal of Therapy:  Heparin level 0.3-0.7 units/ml Monitor platelets by anticoagulation protocol: Yes   Plan:  Continue IV heparin 800 units/hr Daily heparin level and CBC  Hildred Laser, PharmD Clinical Pharmacist Please check Amion for pharmacy contact number   03/01/2018,10:39 AM

## 2018-03-01 NOTE — Progress Notes (Signed)
Patient ID: Deborah [name], female   DOB: 1946-07-07, 72 y.o.   MRN: [id number]      [address]       [place] [id number]             [phone]                 3 Days Post-Op Procedure(s) (LRB): LEFT HEART CATH AND CORONARY ANGIOGRAPHY (N/A)  LOS: 3 days   Subjective: No chest pain today   Objective: Vital signs in last 24 hours: Patient Vitals for the past 24 hrs:  BP Temp Temp src Pulse Resp SpO2 Weight  03/01/18 1100 140/68 (!) 97.5 F (36.4 C) Oral 61 - 99 % -  03/01/18 0828 (!) 117/56 98.5 F (36.9 C) Oral 66 - 97 % -  03/01/18 0549 - - - - - - 97 lb 4.8 oz (44.1 kg)  03/01/18 0548 115/67 98.1 F (36.7 C) Oral (!) 55 12 99 % -  03/01/18 0027 (!) 111/57 98.6 F (37 C) Oral (!) 54 20 96 % -  02/28/18 2023 112/63 98.6 F (37 C) Oral 62 12 97 % -  02/28/18 1638 134/63 98.1 F (36.7 C) Oral - - - -    Filed Weights   02/27/18 0520 02/28/18 0429 03/01/18 0549  Weight: 104 lb 11.2 oz (47.5 kg) 97 lb 8 oz (44.2 kg) 97 lb 4.8 oz (44.1 kg)    Hemodynamic parameters for last 24 hours:    Intake/Output from previous day: 06/30 0701 - 07/01 0700 In: 1345.8 [P.O.:960; I.V.:385.8] Out: 3 [Urine:3] Intake/Output this shift: Total I/O In: 240 [P.O.:240] Out: -   Scheduled Meds: . aspirin EC  81 mg Oral Daily  . [START ON 03/02/2018] heparin-papaverine-plasmalyte irrigation   Irrigation To OR  . [START ON 03/02/2018] magnesium sulfate  40 mEq Other To OR  . methimazole  5 mg Oral Once per day on Mon Tue Wed Thu Fri Sat  . metoprolol tartrate  12.5 mg Oral BID  . multivitamin with minerals  1 tablet Oral Daily  . [START ON 03/02/2018] potassium chloride  80 mEq Other To OR  . pravastatin  40 mg Oral QHS  . sodium chloride flush  3 mL Intravenous Q12H  . [START ON 03/02/2018] tranexamic acid  15 mg/kg Intravenous To OR  . [START ON 03/02/2018] tranexamic acid  2 mg/kg Intracatheter To OR   Continuous Infusions: . sodium chloride 10 mL/hr at 02/28/18 0548  .  [START ON 03/02/2018] cefUROXime (ZINACEF)  IV    . [START ON 03/02/2018] cefUROXime (ZINACEF)  IV    . [START ON 03/02/2018] dexmedetomidine    . [START ON 03/02/2018] DOPamine    . [START ON 03/02/2018] epinephrine    . [START ON 03/02/2018] heparin 30,000 units/NS 1000 mL solution for CELLSAVER    . heparin 800 Units/hr (02/28/18 0953)  . [START ON 03/02/2018] insulin (NOVOLIN-R) infusion    . [START ON 03/02/2018] milrinone    . [START ON 03/02/2018] nitroGLYCERIN    . [START ON 03/02/2018] phenylephrine 20mg /248mL NS (0.08mg /ml) infusion    . [START ON 03/02/2018] tranexamic acid (CYKLOKAPRON) infusion ([facility])    . [START ON 03/02/2018] vancomycin     PRN Meds:.sodium chloride, acetaminophen, nitroGLYCERIN, ondansetron (ZOFRAN) IV, sodium chloride flush  General appearance: alert, cooperative and no distress Neurologic: intact Heart: regular rate and rhythm, S1, S2 normal, no murmur, click, rub or gallop Lungs: clear to auscultation bilaterally Abdomen: soft, non-tender; bowel  sounds normal; no masses,  no organomegaly Extremities: extremities normal, atraumatic, no cyanosis or edema and Homans sign is negative, no sign of DVT  Lab Results: CBC: Recent Labs    02/28/18 0130 03/01/18 0508  WBC 6.6 6.9  HGB 11.6* 12.7  HCT 35.0* 38.4  PLT 202 221   BMET:  Recent Labs    02/27/18 0518 03/01/18 0508  NA 137 137  K 4.2 4.0  CL 104 103  CO2 27 27  GLUCOSE 99 94  BUN 13 11  CREATININE 0.72 0.69  CALCIUM 8.8* 9.0    PT/INR:  Recent Labs    03/01/18 0508  LABPROT 14.4  INR 1.13     Radiology No results found.   Assessment/Plan: S/P Procedure(s) (LRB): LEFT HEART CATH AND CORONARY ANGIOGRAPHY (N/A) Plan CABG in am. Cabg discussed with patient and her husband . She is will to proceed  The goals risks and alternatives of the planned surgical procedure CABG have been discussed with the patient in detail. The risks of the procedure including death, infection, stroke, myocardial  infarction, bleeding, blood transfusion have all been discussed specifically.  I have quoted Deborah [name] a 3 % of perioperative mortality and a complication rate as high as  35 %. The patient's questions have been answered.Deborah [name] is willing  to proceed with the planned procedure.    Grace Isaac MD 03/01/2018 2:20 PM

## 2018-03-01 NOTE — Progress Notes (Signed)
Progress Note  Patient Name: Deborah [name] Date of Encounter: 03/01/2018  Primary Cardiologist: Peter Martinique, MD  Subjective   Feeling well.   Inpatient Medications    Scheduled Meds: . aspirin EC  81 mg Oral Daily  . methimazole  5 mg Oral Once per day on Mon Tue Wed Thu Fri Sat  . metoprolol tartrate  12.5 mg Oral BID  . multivitamin with minerals  1 tablet Oral Daily  . pravastatin  40 mg Oral QHS  . sodium chloride flush  3 mL Intravenous Q12H   Continuous Infusions: . sodium chloride 10 mL/hr at 02/28/18 0548  . heparin 800 Units/hr (02/28/18 0953)   PRN Meds: sodium chloride, acetaminophen, nitroGLYCERIN, ondansetron (ZOFRAN) IV, sodium chloride flush   Vital Signs    Vitals:   03/01/18 0027 03/01/18 0548 03/01/18 0549 03/01/18 0828  BP: (!) 111/57 115/67  (!) 117/56  Pulse: (!) 54 (!) 55  66  Resp: 20 12    Temp: 98.6 F (37 C) 98.1 F (36.7 C)  98.5 F (36.9 C)  TempSrc: Oral Oral  Oral  SpO2: 96% 99%  97%  Weight:   97 lb 4.8 oz (44.1 kg)   Height:        Intake/Output Summary (Last 24 hours) at 03/01/2018 0927 Last data filed at 03/01/2018 3875 Gross per 24 hour  Intake 1585.79 ml  Output 3 ml  Net 1582.79 ml   Filed Weights   02/27/18 0520 02/28/18 0429 03/01/18 0549  Weight: 104 lb 11.2 oz (47.5 kg) 97 lb 8 oz (44.2 kg) 97 lb 4.8 oz (44.1 kg)    Telemetry    SR - Personally Reviewed  Physical Exam   General: Well developed, well nourished, female appearing in no acute distress. Head: Normocephalic, atraumatic.  Neck: Supple without bruits, JVD. Lungs:  Resp regular and unlabored, CTA. Heart: RRR, S1, S2, no murmur; no rub. Abdomen: Soft, non-tender, non-distended with normoactive bowel sounds.  Extremities: No clubbing, cyanosis, edema. Distal pedal pulses are 2+ bilaterally. Neuro: Alert and oriented X 3. Moves all extremities spontaneously. Psych: Normal affect.  Labs    Chemistry Recent Labs  Lab 02/26/18 0739 02/27/18 0518  03/01/18 0508  NA 137 137 137  K 3.7 4.2 4.0  CL 103 104 103  CO2 24 27 27   GLUCOSE 145* 99 94  BUN 18 13 11   CREATININE 0.72 0.72 0.69  CALCIUM 9.3 8.8* 9.0  PROT  --   --  5.9*  ALBUMIN  --   --  3.7  AST  --   --  21  ALT  --   --  17  ALKPHOS  --   --  40  BILITOT  --   --  0.7  GFRNONAA >60 >60 >60  GFRAA >60 >60 >60  ANIONGAP 10 6 7      Hematology Recent Labs  Lab 02/27/18 0518 02/28/18 0130 03/01/18 0508  WBC 6.9 6.6 6.9  RBC 3.68* 3.58* 3.93  HGB 11.7* 11.6* 12.7  HCT 35.8* 35.0* 38.4  MCV 97.3 97.8 97.7  MCH 31.8 32.4 32.3  MCHC 32.7 33.1 33.1  RDW 12.2 12.4 12.1  PLT 214 202 221    Cardiac Enzymes Recent Labs  Lab 02/26/18 0903  TROPONINI 0.05*    Recent Labs  Lab 02/26/18 0742  TROPIPOC 0.05     BNPNo results for input(s): BNP, PROBNP in the last 168 hours.   DDimer No results for input(s): DDIMER in the last  168 hours.    Radiology    No results found.  Cardiac Studies   Cath: 02/26/18  Conclusion     Culprit lesion: Mid RCA lesion is 95% stenosed. Heavily calcified irregular lesion  Prox RCA to Mid RCA lesion is 25% stenosed. Dist RCA lesion is 45% stenosed.  Prox LAD-1 lesion is 85% stenosed. - Prox LAD-2 lesion is 55% stenosed. - Prox LAD-3 lesion is 70% stenosed. (tandem lesions)  The left ventricular systolic function is normal. The left ventricular ejection fraction is greater than 65% by visual estimate.  LV end diastolic pressure is normal.  There is mild (2+) mitral regurgitation.   Heavily calcified coronary arteries with severe mid RCA 99% stenosis and diffuse 70 to 80% ostial-proximal LAD stenosis. Normal LV function and EDP.  Patient has severe disease.  We will consult CT surgery.  She will be admitted to the stepdown unit.  Placed on IV heparin.  If necessary IV nitroglycerin.  Continue risk factor modification - aspirin, statin, beta-blocker etc.      Glenetta Hew, M.D., M.S. Interventional  Cardiologist    TTE: 02/27/18  Study Conclusions  - Left ventricle: The cavity size was normal. Wall thickness was   normal. Systolic function was normal. The estimated ejection   fraction was in the range of 60% to 65%. Wall motion was normal;   there were no regional wall motion abnormalities. Doppler   parameters are consistent with abnormal left ventricular   relaxation (grade 1 diastolic dysfunction). - Aortic valve: There was trivial regurgitation. - Atrial septum: There was an atrial septal aneurysm.  Impressions:  - Normal LV systolic function; mild diastolic dysfunction; trace   AI, MR and TR.  Patient Profile     72 y.o. female with a history of hyperthyroidism and hyperlipidemia.Presentedfor chest pain very suspicious for angina.Underwent cath with severe 2v disease noted. Planned for CABG.   Assessment & Plan    1. Unstable Angina: underwent cardiac cath noted above with mid RCA 95%, prox LAD 85%, 55%, 70% tandem lesions. LVEF>65%. Follow up echo with normal EF and G1DD. Seen by Dr. Servando Snare and planned for CABG possibly in the am. Remains on medical therapy with ASA, statin, BB and IV heparin. Nitro has been titrated off now, with no chest pain since last Friday.   2. HL: on statin therapy, LDL 88   Signed, Reino Bellis, NP  03/01/2018, 9:27 AM  Pager # [phone]   For questions or updates, please contact [facility] Please consult [email or web address] for contact info under Cardiology/STEMI.

## 2018-03-02 ENCOUNTER — Inpatient Hospital Stay (HOSPITAL_COMMUNITY)
Admission: EM | Disposition: A | Discharge status: Home / Self Care | Payer: Self-pay | Source: Home / Self Care | Attending: Cardiology

## 2018-03-02 ENCOUNTER — Inpatient Hospital Stay (HOSPITAL_COMMUNITY): Payer: Medicare Other

## 2018-03-02 ENCOUNTER — Encounter (HOSPITAL_COMMUNITY): Payer: PRIVATE HEALTH INSURANCE

## 2018-03-02 ENCOUNTER — Encounter (HOSPITAL_COMMUNITY): Payer: Self-pay | Admitting: Certified Registered Nurse Anesthetist

## 2018-03-02 ENCOUNTER — Inpatient Hospital Stay (HOSPITAL_COMMUNITY): Payer: Medicare Other | Admitting: Anesthesiology

## 2018-03-02 DIAGNOSIS — I25110 Atherosclerotic heart disease of native coronary artery with unstable angina pectoris: Secondary | ICD-10-CM

## 2018-03-02 HISTORY — PX: CORONARY ARTERY BYPASS GRAFT: SHX141

## 2018-03-02 HISTORY — PX: TEE WITHOUT CARDIOVERSION: SHX5443

## 2018-03-02 LAB — POCT I-STAT 3, ART BLOOD GAS (G3+)
ACID-BASE DEFICIT: 2 mmol/L (ref 0.0–2.0)
Acid-base deficit: 6 mmol/L — ABNORMAL HIGH (ref 0.0–2.0)
Acid-base deficit: 5 mmol/L — ABNORMAL HIGH (ref 0.0–2.0)
Acid-base deficit: 5 mmol/L — ABNORMAL HIGH (ref 0.0–2.0)
BICARBONATE: 23.6 mmol/L (ref 20.0–28.0)
BICARBONATE: 20.4 mmol/L (ref 20.0–28.0)
Bicarbonate: 24.2 mmol/L (ref 20.0–28.0)
Bicarbonate: 23.5 mmol/L (ref 20.0–28.0)
Bicarbonate: 18.3 mmol/L — ABNORMAL LOW (ref 20.0–28.0)
Bicarbonate: 20.9 mmol/L (ref 20.0–28.0)
O2 SAT: 100 %
O2 SAT: 98 %
O2 Saturation: 100 %
O2 Saturation: 100 %
O2 Saturation: 99 %
O2 Saturation: 99 %
PCO2 ART: 33.8 mmHg (ref 32.0–48.0)
PCO2 ART: 39.4 mmHg (ref 32.0–48.0)
PCO2 ART: 41.4 mmHg (ref 32.0–48.0)
PH ART: 7.455 — AB (ref 7.350–7.450)
PH ART: 7.38 (ref 7.350–7.450)
PH ART: 7.321 — AB (ref 7.350–7.450)
PH ART: 7.313 — AB (ref 7.350–7.450)
PO2 ART: 422 mmHg — AB (ref 83.0–108.0)
Patient temperature: 36.9
Patient temperature: 37.4
TCO2: 25 mmol/L (ref 22–32)
TCO2: 25 mmol/L (ref 22–32)
TCO2: 25 mmol/L (ref 22–32)
TCO2: 19 mmol/L — AB (ref 22–32)
TCO2: 22 mmol/L (ref 22–32)
TCO2: 22 mmol/L (ref 22–32)
pCO2 arterial: 44.8 mmHg (ref 32.0–48.0)
pCO2 arterial: 34.5 mmHg (ref 32.0–48.0)
pCO2 arterial: 30.5 mmHg — ABNORMAL LOW (ref 32.0–48.0)
pH, Arterial: 7.33 — ABNORMAL LOW (ref 7.350–7.450)
pH, Arterial: 7.45 (ref 7.350–7.450)
pO2, Arterial: 461 mmHg — ABNORMAL HIGH (ref 83.0–108.0)
pO2, Arterial: 389 mmHg — ABNORMAL HIGH (ref 83.0–108.0)
pO2, Arterial: 160 mmHg — ABNORMAL HIGH (ref 83.0–108.0)
pO2, Arterial: 134 mmHg — ABNORMAL HIGH (ref 83.0–108.0)
pO2, Arterial: 109 mmHg — ABNORMAL HIGH (ref 83.0–108.0)

## 2018-03-02 LAB — POCT I-STAT, CHEM 8
BUN: 8 mg/dL (ref 8–23)
BUN: 6 mg/dL — ABNORMAL LOW (ref 8–23)
BUN: 8 mg/dL (ref 8–23)
BUN: 7 mg/dL — ABNORMAL LOW (ref 8–23)
BUN: 6 mg/dL — AB (ref 8–23)
BUN: 8 mg/dL (ref 8–23)
CALCIUM ION: 0.99 mmol/L — AB (ref 1.15–1.40)
CALCIUM ION: 1.24 mmol/L (ref 1.15–1.40)
CALCIUM ION: 1.13 mmol/L — AB (ref 1.15–1.40)
CHLORIDE: 104 mmol/L (ref 98–111)
CHLORIDE: 107 mmol/L (ref 98–111)
CREATININE: 0.4 mg/dL — AB (ref 0.44–1.00)
CREATININE: 0.3 mg/dL — AB (ref 0.44–1.00)
CREATININE: 0.3 mg/dL — AB (ref 0.44–1.00)
Calcium, Ion: 1.23 mmol/L (ref 1.15–1.40)
Calcium, Ion: 1.1 mmol/L — ABNORMAL LOW (ref 1.15–1.40)
Calcium, Ion: 1.08 mmol/L — ABNORMAL LOW (ref 1.15–1.40)
Chloride: 101 mmol/L (ref 98–111)
Chloride: 100 mmol/L (ref 98–111)
Chloride: 102 mmol/L (ref 98–111)
Chloride: 102 mmol/L (ref 98–111)
Creatinine, Ser: 0.3 mg/dL — ABNORMAL LOW (ref 0.44–1.00)
Creatinine, Ser: 0.3 mg/dL — ABNORMAL LOW (ref 0.44–1.00)
Creatinine, Ser: 0.4 mg/dL — ABNORMAL LOW (ref 0.44–1.00)
GLUCOSE: 106 mg/dL — AB (ref 70–99)
GLUCOSE: 114 mg/dL — AB (ref 70–99)
Glucose, Bld: 177 mg/dL — ABNORMAL HIGH (ref 70–99)
Glucose, Bld: 76 mg/dL (ref 70–99)
Glucose, Bld: 116 mg/dL — ABNORMAL HIGH (ref 70–99)
Glucose, Bld: 107 mg/dL — ABNORMAL HIGH (ref 70–99)
HCT: 28 % — ABNORMAL LOW (ref 36.0–46.0)
HCT: 25 % — ABNORMAL LOW (ref 36.0–46.0)
HEMATOCRIT: 33 % — AB (ref 36.0–46.0)
HEMATOCRIT: 22 % — AB (ref 36.0–46.0)
HEMATOCRIT: 23 % — AB (ref 36.0–46.0)
HEMATOCRIT: 25 % — AB (ref 36.0–46.0)
HEMOGLOBIN: 7.8 g/dL — AB (ref 12.0–15.0)
HEMOGLOBIN: 8.5 g/dL — AB (ref 12.0–15.0)
Hemoglobin: 11.2 g/dL — ABNORMAL LOW (ref 12.0–15.0)
Hemoglobin: 7.5 g/dL — ABNORMAL LOW (ref 12.0–15.0)
Hemoglobin: 9.5 g/dL — ABNORMAL LOW (ref 12.0–15.0)
Hemoglobin: 8.5 g/dL — ABNORMAL LOW (ref 12.0–15.0)
POTASSIUM: 4 mmol/L (ref 3.5–5.1)
POTASSIUM: 3.6 mmol/L (ref 3.5–5.1)
Potassium: 3.8 mmol/L (ref 3.5–5.1)
Potassium: 3.8 mmol/L (ref 3.5–5.1)
Potassium: 4.4 mmol/L (ref 3.5–5.1)
Potassium: 4.2 mmol/L (ref 3.5–5.1)
SODIUM: 139 mmol/L (ref 135–145)
SODIUM: 138 mmol/L (ref 135–145)
Sodium: 137 mmol/L (ref 135–145)
Sodium: 137 mmol/L (ref 135–145)
Sodium: 139 mmol/L (ref 135–145)
Sodium: 139 mmol/L (ref 135–145)
TCO2: 24 mmol/L (ref 22–32)
TCO2: 25 mmol/L (ref 22–32)
TCO2: 26 mmol/L (ref 22–32)
TCO2: 24 mmol/L (ref 22–32)
TCO2: 23 mmol/L (ref 22–32)
TCO2: 21 mmol/L — ABNORMAL LOW (ref 22–32)

## 2018-03-02 LAB — CBC
HCT: 29.6 % — ABNORMAL LOW (ref 36.0–46.0)
HCT: 28.4 % — ABNORMAL LOW (ref 36.0–46.0)
HEMATOCRIT: 42 % (ref 36.0–46.0)
Hemoglobin: 13.7 g/dL (ref 12.0–15.0)
Hemoglobin: 9.8 g/dL — ABNORMAL LOW (ref 12.0–15.0)
Hemoglobin: 9.1 g/dL — ABNORMAL LOW (ref 12.0–15.0)
MCH: 32.2 pg (ref 26.0–34.0)
MCH: 32.6 pg (ref 26.0–34.0)
MCH: 31.9 pg (ref 26.0–34.0)
MCHC: 32.6 g/dL (ref 30.0–36.0)
MCHC: 33.1 g/dL (ref 30.0–36.0)
MCHC: 32 g/dL (ref 30.0–36.0)
MCV: 98.8 fL (ref 78.0–100.0)
MCV: 98.3 fL (ref 78.0–100.0)
MCV: 99.6 fL (ref 78.0–100.0)
PLATELETS: 246 10*3/uL (ref 150–400)
Platelets: 118 10*3/uL — ABNORMAL LOW (ref 150–400)
Platelets: 96 10*3/uL — ABNORMAL LOW (ref 150–400)
RBC: 4.25 MIL/uL (ref 3.87–5.11)
RBC: 3.01 MIL/uL — ABNORMAL LOW (ref 3.87–5.11)
RBC: 2.85 MIL/uL — ABNORMAL LOW (ref 3.87–5.11)
RDW: 12.4 % (ref 11.5–15.5)
RDW: 12.4 % (ref 11.5–15.5)
RDW: 12.5 % (ref 11.5–15.5)
WBC: 7.6 10*3/uL (ref 4.0–10.5)
WBC: 13 10*3/uL — ABNORMAL HIGH (ref 4.0–10.5)
WBC: 8.6 10*3/uL (ref 4.0–10.5)

## 2018-03-02 LAB — BASIC METABOLIC PANEL
Anion gap: 10 (ref 5–15)
BUN: 7 mg/dL — ABNORMAL LOW (ref 8–23)
CO2: 24 mmol/L (ref 22–32)
Calcium: 9.3 mg/dL (ref 8.9–10.3)
Chloride: 104 mmol/L (ref 98–111)
Creatinine, Ser: 0.72 mg/dL (ref 0.44–1.00)
GFR calc Af Amer: >60 mL/min (ref >60)
GFR calc non Af Amer: >60 mL/min (ref >60)
Glucose, Bld: 97 mg/dL (ref 70–99)
Potassium: 3.7 mmol/L (ref 3.5–5.1)
Sodium: 138 mmol/L (ref 135–145)

## 2018-03-02 LAB — PROTIME-INR
INR: 1.59
Prothrombin Time: 18.8 seconds — ABNORMAL HIGH (ref 11.4–15.2)

## 2018-03-02 LAB — MAGNESIUM: Magnesium: 2.9 mg/dL — ABNORMAL HIGH (ref 1.7–2.4)

## 2018-03-02 LAB — HEPARIN LEVEL (UNFRACTIONATED): HEPARIN UNFRACTIONATED: 0.15 [IU]/mL — AB (ref 0.30–0.70)

## 2018-03-02 LAB — POCT I-STAT 4, (NA,K, GLUC, HGB,HCT)
GLUCOSE: 97 mg/dL (ref 70–99)
HCT: 28 % — ABNORMAL LOW (ref 36.0–46.0)
HEMOGLOBIN: 9.5 g/dL — AB (ref 12.0–15.0)
POTASSIUM: 3.6 mmol/L (ref 3.5–5.1)
Sodium: 141 mmol/L (ref 135–145)

## 2018-03-02 LAB — APTT
aPTT: 41 seconds — ABNORMAL HIGH (ref 24–36)
aPTT: 33 seconds (ref 24–36)

## 2018-03-02 LAB — CREATININE, SERUM
Creatinine, Ser: 0.49 mg/dL (ref 0.44–1.00)
GFR calc Af Amer: >60 mL/min (ref >60)
GFR calc non Af Amer: >60 mL/min (ref >60)

## 2018-03-02 LAB — HEMOGLOBIN AND HEMATOCRIT, BLOOD
HCT: 24.2 % — ABNORMAL LOW (ref 36.0–46.0)
Hemoglobin: 8.1 g/dL — ABNORMAL LOW (ref 12.0–15.0)

## 2018-03-02 LAB — PREPARE RBC (CROSSMATCH)

## 2018-03-02 LAB — GLUCOSE, CAPILLARY
GLUCOSE-CAPILLARY: 117 mg/dL — AB (ref 70–99)
GLUCOSE-CAPILLARY: 87 mg/dL (ref 70–99)
Glucose-Capillary: 109 mg/dL — ABNORMAL HIGH (ref 70–99)
Glucose-Capillary: 110 mg/dL — ABNORMAL HIGH (ref 70–99)
Glucose-Capillary: 109 mg/dL — ABNORMAL HIGH (ref 70–99)
Glucose-Capillary: 116 mg/dL — ABNORMAL HIGH (ref 70–99)

## 2018-03-02 LAB — PLATELET COUNT: Platelets: 140 10*3/uL — ABNORMAL LOW (ref 150–400)

## 2018-03-02 LAB — HEMOGLOBIN A1C
Hgb A1c MFr Bld: 5.4 % (ref 4.8–5.6)
MEAN PLASMA GLUCOSE: 108.28 mg/dL

## 2018-03-02 SURGERY — CORONARY ARTERY BYPASS GRAFTING (CABG)
Anesthesia: General | Site: Chest

## 2018-03-02 MED ORDER — SODIUM CHLORIDE 0.9 % IV SOLN
INTRAVENOUS | Status: DC
  Administered 2018-03-02: 13:00:00 via INTRAVENOUS

## 2018-03-02 MED ORDER — CHLORHEXIDINE GLUCONATE 0.12 % MT SOLN
15.0000 mL | OROMUCOSAL | Status: AC
  Administered 2018-03-02: 15 mL via OROMUCOSAL

## 2018-03-02 MED ORDER — VANCOMYCIN HCL IN DEXTROSE 1-5 GM/200ML-% IV SOLN
1000.0000 mg | Freq: Once | INTRAVENOUS | Status: AC
  Administered 2018-03-02: 1000 mg via INTRAVENOUS
  Filled 2018-03-02: qty 200

## 2018-03-02 MED ORDER — CEFAZOLIN SODIUM-DEXTROSE 2-4 GM/100ML-% IV SOLN
2.0000 g | Freq: Three times a day (TID) | INTRAVENOUS | Status: DC
  Administered 2018-03-02 – 2018-03-04 (×5): 2 g via INTRAVENOUS
  Filled 2018-03-02 (×6): qty 100

## 2018-03-02 MED ORDER — LACTATED RINGERS IV SOLN: INTRAVENOUS | Status: DC

## 2018-03-02 MED ORDER — MORPHINE SULFATE (PF) 2 MG/ML IV SOLN
2.0000 mg | INTRAVENOUS | Status: DC | PRN
  Administered 2018-03-02 – 2018-03-03 (×4): 2 mg via INTRAVENOUS
  Filled 2018-03-02 (×4): qty 1

## 2018-03-02 MED ORDER — ASPIRIN EC 325 MG PO TBEC
325.0000 mg | DELAYED_RELEASE_TABLET | Freq: Every day | ORAL | Status: DC
  Administered 2018-03-04: 325 mg via ORAL
  Filled 2018-03-02: qty 1

## 2018-03-02 MED ORDER — SODIUM CHLORIDE 0.45 % IV SOLN
INTRAVENOUS | Status: DC | PRN
  Administered 2018-03-02: 14:00:00 via INTRAVENOUS

## 2018-03-02 MED ORDER — ONDANSETRON HCL 4 MG/2ML IJ SOLN
INTRAMUSCULAR | Status: AC
  Filled 2018-03-02: qty 2

## 2018-03-02 MED ORDER — ONDANSETRON HCL 4 MG/2ML IJ SOLN
4.0000 mg | Freq: Four times a day (QID) | INTRAMUSCULAR | Status: DC | PRN
  Administered 2018-03-02 – 2018-03-03 (×3): 4 mg via INTRAVENOUS
  Filled 2018-03-02: qty 2

## 2018-03-02 MED ORDER — SODIUM CHLORIDE 0.9% FLUSH
3.0000 mL | Freq: Two times a day (BID) | INTRAVENOUS | Status: DC
  Administered 2018-03-03 – 2018-03-04 (×3): 3 mL via INTRAVENOUS

## 2018-03-02 MED ORDER — ALBUMIN HUMAN 5 % IV SOLN
INTRAVENOUS | Status: DC | PRN
  Administered 2018-03-02: 12:00:00 via INTRAVENOUS

## 2018-03-02 MED ORDER — ACETAMINOPHEN 160 MG/5ML PO SOLN: 1000.0000 mg | Freq: Four times a day (QID) | ORAL | Status: DC

## 2018-03-02 MED ORDER — SODIUM CHLORIDE 0.9 % IV SOLN: 1.5000 g | Freq: Two times a day (BID) | INTRAVENOUS | Status: DC

## 2018-03-02 MED ORDER — MIDAZOLAM HCL 10 MG/2ML IJ SOLN
INTRAMUSCULAR | Status: AC
  Filled 2018-03-02: qty 2

## 2018-03-02 MED ORDER — HEPARIN SODIUM (PORCINE) 1000 UNIT/ML IJ SOLN
INTRAMUSCULAR | Status: AC
  Filled 2018-03-02: qty 1

## 2018-03-02 MED ORDER — SUCCINYLCHOLINE CHLORIDE 20 MG/ML IJ SOLN
INTRAMUSCULAR | Status: DC | PRN
  Administered 2018-03-02: 120 mg via INTRAVENOUS

## 2018-03-02 MED ORDER — DEXMEDETOMIDINE HCL IN NACL 400 MCG/100ML IV SOLN: 0.0000 ug/kg/h | INTRAVENOUS | Status: DC

## 2018-03-02 MED ORDER — INSULIN ASPART 100 UNIT/ML ~~LOC~~ SOLN: 0.0000 [IU] | SUBCUTANEOUS | Status: DC

## 2018-03-02 MED ORDER — ACETAMINOPHEN 160 MG/5ML PO SOLN: 650.0000 mg | Freq: Once | ORAL | Status: AC

## 2018-03-02 MED ORDER — METOPROLOL TARTRATE 25 MG/10 ML ORAL SUSPENSION: 12.5000 mg | Freq: Two times a day (BID) | ORAL | Status: DC

## 2018-03-02 MED ORDER — FENTANYL CITRATE (PF) 250 MCG/5ML IJ SOLN
INTRAMUSCULAR | Status: AC
  Filled 2018-03-02: qty 5

## 2018-03-02 MED ORDER — PROPOFOL 10 MG/ML IV BOLUS
INTRAVENOUS | Status: AC
  Filled 2018-03-02: qty 20

## 2018-03-02 MED ORDER — MIDAZOLAM HCL 5 MG/5ML IJ SOLN
INTRAMUSCULAR | Status: DC | PRN
  Administered 2018-03-02: 2 mg via INTRAVENOUS
  Administered 2018-03-02: 1 mg via INTRAVENOUS
  Administered 2018-03-02: 4 mg via INTRAVENOUS
  Administered 2018-03-02: 2 mg via INTRAVENOUS
  Administered 2018-03-02: 1 mg via INTRAVENOUS

## 2018-03-02 MED ORDER — FAMOTIDINE IN NACL 20-0.9 MG/50ML-% IV SOLN: 20.0000 mg | Freq: Two times a day (BID) | INTRAVENOUS | Status: DC

## 2018-03-02 MED ORDER — LACTATED RINGERS IV SOLN
INTRAVENOUS | Status: DC | PRN
  Administered 2018-03-02 (×2): via INTRAVENOUS

## 2018-03-02 MED ORDER — INSULIN REGULAR HUMAN 100 UNIT/ML IJ SOLN: INTRAMUSCULAR | Status: DC

## 2018-03-02 MED ORDER — LACTATED RINGERS IV SOLN
INTRAVENOUS | Status: DC
  Administered 2018-03-03: 06:00:00 via INTRAVENOUS

## 2018-03-02 MED ORDER — ARTIFICIAL TEARS OPHTHALMIC OINT
TOPICAL_OINTMENT | OPHTHALMIC | Status: DC | PRN
  Administered 2018-03-02: 1 via OPHTHALMIC

## 2018-03-02 MED ORDER — MORPHINE SULFATE (PF) 2 MG/ML IV SOLN: 1.0000 mg | INTRAVENOUS | Status: AC | PRN

## 2018-03-02 MED ORDER — MAGNESIUM SULFATE 4 GM/100ML IV SOLN
4.0000 g | Freq: Once | INTRAVENOUS | Status: AC
  Administered 2018-03-02: 4 g via INTRAVENOUS
  Filled 2018-03-02: qty 100

## 2018-03-02 MED ORDER — PANTOPRAZOLE SODIUM 40 MG PO TBEC
40.0000 mg | DELAYED_RELEASE_TABLET | Freq: Every day | ORAL | Status: DC
  Administered 2018-03-04: 40 mg via ORAL
  Filled 2018-03-02: qty 1

## 2018-03-02 MED ORDER — PROPOFOL 10 MG/ML IV BOLUS
INTRAVENOUS | Status: DC | PRN
  Administered 2018-03-02: 70 mg via INTRAVENOUS

## 2018-03-02 MED ORDER — PROTAMINE SULFATE 10 MG/ML IV SOLN
INTRAVENOUS | Status: AC
  Filled 2018-03-02: qty 25

## 2018-03-02 MED ORDER — EPHEDRINE SULFATE 50 MG/ML IJ SOLN
INTRAMUSCULAR | Status: AC
  Filled 2018-03-02: qty 1

## 2018-03-02 MED ORDER — MIDAZOLAM HCL 2 MG/2ML IJ SOLN: 2.0000 mg | INTRAMUSCULAR | Status: DC | PRN

## 2018-03-02 MED ORDER — 0.9 % SODIUM CHLORIDE (POUR BTL) OPTIME
TOPICAL | Status: DC | PRN
  Administered 2018-03-02: 1000 mL

## 2018-03-02 MED ORDER — ROCURONIUM BROMIDE 10 MG/ML (PF) SYRINGE
PREFILLED_SYRINGE | INTRAVENOUS | Status: AC
  Filled 2018-03-02: qty 10

## 2018-03-02 MED ORDER — LACTATED RINGERS IV SOLN: 500.0000 mL | Freq: Once | INTRAVENOUS | Status: DC | PRN

## 2018-03-02 MED ORDER — LACTATED RINGERS IV SOLN
INTRAVENOUS | Status: DC | PRN
  Administered 2018-03-02: 07:00:00 via INTRAVENOUS

## 2018-03-02 MED ORDER — ALBUMIN HUMAN 5 % IV SOLN
250.0000 mL | INTRAVENOUS | Status: AC | PRN
  Administered 2018-03-02 – 2018-03-03 (×4): 250 mL via INTRAVENOUS
  Filled 2018-03-02 (×2): qty 250

## 2018-03-02 MED ORDER — METOPROLOL TARTRATE 5 MG/5ML IV SOLN: 2.5000 mg | INTRAVENOUS | Status: DC | PRN

## 2018-03-02 MED ORDER — BISACODYL 5 MG PO TBEC
10.0000 mg | DELAYED_RELEASE_TABLET | Freq: Every day | ORAL | Status: DC
  Administered 2018-03-03: 10 mg via ORAL
  Filled 2018-03-02: qty 2

## 2018-03-02 MED ORDER — POTASSIUM CHLORIDE 10 MEQ/50ML IV SOLN
10.0000 meq | INTRAVENOUS | Status: AC
  Administered 2018-03-02 (×3): 10 meq via INTRAVENOUS

## 2018-03-02 MED ORDER — TRAMADOL HCL 50 MG PO TABS
50.0000 mg | ORAL_TABLET | ORAL | Status: DC | PRN
  Administered 2018-03-03: 50 mg via ORAL
  Filled 2018-03-02: qty 1

## 2018-03-02 MED ORDER — ACETAMINOPHEN 500 MG PO TABS
1000.0000 mg | ORAL_TABLET | Freq: Four times a day (QID) | ORAL | Status: DC
  Administered 2018-03-02 – 2018-03-04 (×5): 1000 mg via ORAL
  Filled 2018-03-02 (×5): qty 2

## 2018-03-02 MED ORDER — PHENYLEPHRINE 40 MCG/ML (10ML) SYRINGE FOR IV PUSH (FOR BLOOD PRESSURE SUPPORT)
PREFILLED_SYRINGE | INTRAVENOUS | Status: AC
  Filled 2018-03-02: qty 10

## 2018-03-02 MED ORDER — SODIUM CHLORIDE 0.9% FLUSH: 3.0000 mL | INTRAVENOUS | Status: DC | PRN

## 2018-03-02 MED ORDER — DOCUSATE SODIUM 100 MG PO CAPS
200.0000 mg | ORAL_CAPSULE | Freq: Every day | ORAL | Status: DC
  Administered 2018-03-03: 200 mg via ORAL
  Filled 2018-03-02: qty 2

## 2018-03-02 MED ORDER — INSULIN REGULAR BOLUS VIA INFUSION
0.0000 [IU] | Freq: Three times a day (TID) | INTRAVENOUS | Status: DC
  Filled 2018-03-02: qty 10

## 2018-03-02 MED ORDER — HEPARIN SODIUM (PORCINE) 1000 UNIT/ML IJ SOLN
INTRAMUSCULAR | Status: DC | PRN
  Administered 2018-03-02: 15000 [IU] via INTRAVENOUS
  Administered 2018-03-02: 7000 [IU] via INTRAVENOUS

## 2018-03-02 MED ORDER — ASPIRIN 81 MG PO CHEW: 324.0000 mg | CHEWABLE_TABLET | Freq: Every day | ORAL | Status: DC

## 2018-03-02 MED ORDER — HEMOSTATIC AGENTS (NO CHARGE) OPTIME
TOPICAL | Status: DC | PRN
  Administered 2018-03-02 (×3): 1 via TOPICAL

## 2018-03-02 MED ORDER — SODIUM CHLORIDE 0.9 % IV SOLN: 250.0000 mL | INTRAVENOUS | Status: DC

## 2018-03-02 MED ORDER — ROCURONIUM BROMIDE 10 MG/ML (PF) SYRINGE
PREFILLED_SYRINGE | INTRAVENOUS | Status: DC | PRN
  Administered 2018-03-02 (×4): 50 mg via INTRAVENOUS

## 2018-03-02 MED ORDER — NITROGLYCERIN IN D5W 200-5 MCG/ML-% IV SOLN: 0.0000 ug/min | INTRAVENOUS | Status: DC

## 2018-03-02 MED ORDER — SUCCINYLCHOLINE CHLORIDE 200 MG/10ML IV SOSY
PREFILLED_SYRINGE | INTRAVENOUS | Status: AC
  Filled 2018-03-02: qty 10

## 2018-03-02 MED ORDER — FENTANYL CITRATE (PF) 250 MCG/5ML IJ SOLN
INTRAMUSCULAR | Status: AC
  Filled 2018-03-02: qty 20

## 2018-03-02 MED ORDER — BISACODYL 10 MG RE SUPP: 10.0000 mg | Freq: Every day | RECTAL | Status: DC

## 2018-03-02 MED ORDER — PROTAMINE SULFATE 10 MG/ML IV SOLN
INTRAVENOUS | Status: DC | PRN
  Administered 2018-03-02: 140 mg via INTRAVENOUS

## 2018-03-02 MED ORDER — ROCURONIUM BROMIDE 10 MG/ML (PF) SYRINGE
PREFILLED_SYRINGE | INTRAVENOUS | Status: AC
  Filled 2018-03-02: qty 20

## 2018-03-02 MED ORDER — OXYCODONE HCL 5 MG PO TABS
5.0000 mg | ORAL_TABLET | ORAL | Status: DC | PRN
  Administered 2018-03-03: 10 mg via ORAL
  Filled 2018-03-02: qty 2

## 2018-03-02 MED ORDER — METOPROLOL TARTRATE 12.5 MG HALF TABLET
12.5000 mg | ORAL_TABLET | Freq: Two times a day (BID) | ORAL | Status: DC
  Administered 2018-03-03 – 2018-03-04 (×3): 12.5 mg via ORAL
  Filled 2018-03-02 (×4): qty 1

## 2018-03-02 MED ORDER — FENTANYL CITRATE (PF) 250 MCG/5ML IJ SOLN
INTRAMUSCULAR | Status: DC | PRN
  Administered 2018-03-02: 100 ug via INTRAVENOUS
  Administered 2018-03-02: 50 ug via INTRAVENOUS
  Administered 2018-03-02: 100 ug via INTRAVENOUS
  Administered 2018-03-02: 150 ug via INTRAVENOUS
  Administered 2018-03-02 (×2): 100 ug via INTRAVENOUS
  Administered 2018-03-02: 150 ug via INTRAVENOUS
  Administered 2018-03-02: 100 ug via INTRAVENOUS

## 2018-03-02 MED ORDER — SODIUM CHLORIDE 0.9% IV SOLUTION: Freq: Once | INTRAVENOUS | Status: DC

## 2018-03-02 MED ORDER — PHENYLEPHRINE HCL-NACL 20-0.9 MG/250ML-% IV SOLN
0.0000 ug/min | INTRAVENOUS | Status: DC
  Filled 2018-03-02 (×2): qty 250

## 2018-03-02 MED ORDER — ACETAMINOPHEN 650 MG RE SUPP
650.0000 mg | Freq: Once | RECTAL | Status: AC
  Administered 2018-03-02: 650 mg via RECTAL

## 2018-03-02 SURGICAL SUPPLY — 73 items
ADH SKN CLS APL DERMABOND .7 (GAUZE/BANDAGES/DRESSINGS) ×4
ADH SKN CLS LQ APL DERMABOND (GAUZE/BANDAGES/DRESSINGS) ×2
BAG DECANTER FOR FLEXI CONT (MISCELLANEOUS) ×3 IMPLANT
BANDAGE ACE 4X5 VEL STRL LF (GAUZE/BANDAGES/DRESSINGS) ×3 IMPLANT
BANDAGE ACE 6X5 VEL STRL LF (GAUZE/BANDAGES/DRESSINGS) ×3 IMPLANT
BLADE STERNUM SYSTEM 6 (BLADE) ×3 IMPLANT
BLADE SURG 11 STRL SS (BLADE) ×1 IMPLANT
BNDG GAUZE ELAST 4 BULKY (GAUZE/BANDAGES/DRESSINGS) ×3 IMPLANT
CANISTER SUCT 3000ML PPV (MISCELLANEOUS) ×3 IMPLANT
CANNULA VEN 2 STAGE (MISCELLANEOUS) ×1 IMPLANT
CATH CPB KIT GERHARDT (MISCELLANEOUS) ×3 IMPLANT
CATH THORACIC 28FR (CATHETERS) ×3 IMPLANT
CONT SPEC 4OZ CLIKSEAL STRL BL (MISCELLANEOUS) ×1 IMPLANT
CRADLE DONUT ADULT HEAD (MISCELLANEOUS) ×3 IMPLANT
DERMABOND ADHESIVE PROPEN (GAUZE/BANDAGES/DRESSINGS) ×1
DERMABOND ADVANCED (GAUZE/BANDAGES/DRESSINGS) ×2
DERMABOND ADVANCED .7 DNX12 (GAUZE/BANDAGES/DRESSINGS) IMPLANT
DERMABOND ADVANCED .7 DNX6 (GAUZE/BANDAGES/DRESSINGS) IMPLANT
DRAIN CHANNEL 28F RND 3/8 FF (WOUND CARE) ×3 IMPLANT
DRAPE CARDIOVASCULAR INCISE (DRAPES) ×3
DRAPE SLUSH/WARMER DISC (DRAPES) ×3 IMPLANT
DRAPE SRG 135X102X78XABS (DRAPES) ×2 IMPLANT
DRSG AQUACEL AG ADV 3.5X14 (GAUZE/BANDAGES/DRESSINGS) ×3 IMPLANT
ELECT BLADE 4.0 EZ CLEAN MEGAD (MISCELLANEOUS) ×3
ELECT REM PT RETURN 9FT ADLT (ELECTROSURGICAL) ×6
ELECTRODE BLDE 4.0 EZ CLN MEGD (MISCELLANEOUS) ×2 IMPLANT
ELECTRODE REM PT RTRN 9FT ADLT (ELECTROSURGICAL) ×4 IMPLANT
FELT TEFLON 1X6 (MISCELLANEOUS) ×6 IMPLANT
GAUZE SPONGE 4X4 12PLY STRL (GAUZE/BANDAGES/DRESSINGS) ×6 IMPLANT
GAUZE SPONGE 4X4 12PLY STRL LF (GAUZE/BANDAGES/DRESSINGS) ×3 IMPLANT
GLOVE BIO SURGEON STRL SZ 6.5 (GLOVE) ×9 IMPLANT
GOWN STRL REUS W/ TWL LRG LVL3 (GOWN DISPOSABLE) ×8 IMPLANT
GOWN STRL REUS W/TWL LRG LVL3 (GOWN DISPOSABLE) ×12
HEMOSTAT POWDER SURGIFOAM 1G (HEMOSTASIS) ×9 IMPLANT
HEMOSTAT SURGICEL 2X14 (HEMOSTASIS) ×3 IMPLANT
KIT BASIN OR (CUSTOM PROCEDURE TRAY) ×3 IMPLANT
KIT CATH SUCT 8FR (CATHETERS) ×3 IMPLANT
KIT SUCTION CATH 14FR (SUCTIONS) ×6 IMPLANT
KIT TURNOVER KIT B (KITS) ×3 IMPLANT
KIT VASOVIEW HEMOPRO VH 3000 (KITS) ×3 IMPLANT
LEAD PACING MYOCARDI (MISCELLANEOUS) ×3 IMPLANT
MARKER GRAFT CORONARY BYPASS (MISCELLANEOUS) ×9 IMPLANT
NS IRRIG 1000ML POUR BTL (IV SOLUTION) ×15 IMPLANT
PACK E OPEN HEART (SUTURE) ×3 IMPLANT
PACK OPEN HEART (CUSTOM PROCEDURE TRAY) ×3 IMPLANT
PAD ARMBOARD 7.5X6 YLW CONV (MISCELLANEOUS) ×6 IMPLANT
PAD ELECT DEFIB RADIOL ZOLL (MISCELLANEOUS) ×3 IMPLANT
PENCIL BUTTON HOLSTER BLD 10FT (ELECTRODE) ×3 IMPLANT
PUNCH AORTIC ROTATE  4.5MM 8IN (MISCELLANEOUS) ×1 IMPLANT
SET CARDIOPLEGIA MPS 5001102 (MISCELLANEOUS) ×1 IMPLANT
SPONGE LAP 18X18 X RAY DECT (DISPOSABLE) ×3 IMPLANT
SUT BONE WAX W31G (SUTURE) ×3 IMPLANT
SUT MNCRL AB 4-0 PS2 18 (SUTURE) ×2 IMPLANT
SUT PROLENE 3 0 SH1 36 (SUTURE) ×4 IMPLANT
SUT PROLENE 4 0 TF (SUTURE) ×6 IMPLANT
SUT PROLENE 5 0 C 1 36 (SUTURE) ×1 IMPLANT
SUT PROLENE 6 0 CC (SUTURE) ×6 IMPLANT
SUT PROLENE 7 0 BV1 MDA (SUTURE) ×3 IMPLANT
SUT PROLENE 8 0 BV175 6 (SUTURE) ×7 IMPLANT
SUT STEEL 6MS V (SUTURE) ×3 IMPLANT
SUT STEEL SZ 6 DBL 3X14 BALL (SUTURE) ×3 IMPLANT
SUT VIC AB 1 CTX 18 (SUTURE) ×6 IMPLANT
SUT VIC AB 2-0 CT1 27 (SUTURE) ×3
SUT VIC AB 2-0 CT1 TAPERPNT 27 (SUTURE) IMPLANT
SYSTEM SAHARA CHEST DRAIN ATS (WOUND CARE) ×3 IMPLANT
TAPE CLOTH SURG 4X10 WHT LF (GAUZE/BANDAGES/DRESSINGS) ×3 IMPLANT
TAPE PAPER 2X10 WHT MICROPORE (GAUZE/BANDAGES/DRESSINGS) ×1 IMPLANT
TOWEL GREEN STERILE (TOWEL DISPOSABLE) ×3 IMPLANT
TOWEL GREEN STERILE FF (TOWEL DISPOSABLE) ×3 IMPLANT
TRAY FOLEY SLVR 16FR TEMP STAT (SET/KITS/TRAYS/PACK) ×3 IMPLANT
TUBING INSUFFLATION (TUBING) ×3 IMPLANT
UNDERPAD 30X30 (UNDERPADS AND DIAPERS) ×3 IMPLANT
WATER STERILE IRR 1000ML POUR (IV SOLUTION) ×6 IMPLANT

## 2018-03-02 NOTE — Progress Notes (Signed)
Patient ID: Deborah [name], female   DOB: November 21, 1945, 72 y.o.   MRN: [id number]  TCTS Evening Rounds:   Hemodynamically stable  CI = 2.2  Extubated  Urine output good  CT output low  CBC    Component Value Date/Time   WBC 13.0 (H) 03/02/2018 1324   RBC 3.01 (L) 03/02/2018 1324   HGB 9.8 (L) 03/02/2018 1324   HCT 29.6 (L) 03/02/2018 1324   PLT 118 (L) 03/02/2018 1324   MCV 98.3 03/02/2018 1324   MCH 32.6 03/02/2018 1324   MCHC 33.1 03/02/2018 1324   RDW 12.4 03/02/2018 1324     BMET    Component Value Date/Time   NA 141 03/02/2018 1321   K 3.6 03/02/2018 1321   CL 104 03/02/2018 1207   CO2 24 03/02/2018 0553   GLUCOSE 97 03/02/2018 1321   BUN 6 (L) 03/02/2018 1207   CREATININE 0.30 (L) 03/02/2018 1207   CALCIUM 9.3 03/02/2018 0553   GFRNONAA >60 03/02/2018 0553   GFRAA >60 03/02/2018 0553     A/P:  Stable postop course. Continue current plans

## 2018-03-02 NOTE — Progress Notes (Signed)
   Post multivessel CABG today per Dr. Servando Snare.  Unit.  Relatively soft blood pressures.  Oxygenating well.  ECG is pending.

## 2018-03-02 NOTE — Procedures (Addendum)
Extubation Procedure Note  Patient Details:   Name: Deborah [name] DOB: 1946-08-10 MRN: [id number]   Airway Documentation:    Vent end date: 03/02/18 Vent end time: 1645   Evaluation  O2 sats: stable throughout Complications: No apparent complications Patient did tolerate procedure well. Bilateral Breath Sounds: Clear, Diminished   Yes  Incentive spirometer instructed 532ml Placed on 4l/min [name] NIF -20 FVC 621ml  [name] 03/02/2018, 4:54 PM

## 2018-03-02 NOTE — Anesthesia Procedure Notes (Signed)
Arterial Line Insertion Start/End7/10/2017 6:50 AM, 03/02/2018 6:52 AM Performed by: Inda Coke, CRNA, CRNA  Patient location: Pre-op. Preanesthetic checklist: patient identified, IV checked, site marked, risks and benefits discussed, surgical consent, monitors and equipment checked, pre-op evaluation, timeout performed and anesthesia consent Lidocaine 1% used for infiltration and patient sedated Left, radial was placed Catheter size: 20 G Hand hygiene performed  and maximum sterile barriers used  Allen's test indicative of satisfactory collateral circulation Attempts: 1 Procedure performed without using ultrasound guided technique. Ultrasound Notes:anatomy identified Following insertion, dressing applied and Biopatch. Post procedure assessment: normal  Patient tolerated the procedure well with no immediate complications.

## 2018-03-02 NOTE — Anesthesia Procedure Notes (Signed)
Procedure Name: Intubation Date/Time: 03/02/2018 8:00 AM Performed by: Inda Coke, CRNA Pre-anesthesia Checklist: Patient identified, Emergency Drugs available, Suction available and Patient being monitored Patient Re-evaluated:Patient Re-evaluated prior to induction Oxygen Delivery Method: Circle System Utilized Preoxygenation: Pre-oxygenation with 100% oxygen Induction Type: IV induction and Cricoid Pressure applied Ventilation: Mask ventilation without difficulty Laryngoscope Size: Mac and 3 Grade View: Grade I Tube type: Oral Tube size: 7.5 mm Number of attempts: 1 Airway Equipment and Method: Stylet and Oral airway Placement Confirmation: ETT inserted through vocal cords under direct vision,  positive ETCO2 and breath sounds checked- equal and bilateral Secured at: 21 cm Tube secured with: Tape Dental Injury: Teeth and Oropharynx as per pre-operative assessment

## 2018-03-02 NOTE — Anesthesia Postprocedure Evaluation (Signed)
Anesthesia Post Note  Patient: Deborah [name]  Procedure(s) Performed: CORONARY ARTERY BYPASS GRAFTING (CABG) (N/A Chest) TRANSESOPHAGEAL ECHOCARDIOGRAM (TEE) (N/A )     Patient location during evaluation: SICU Anesthesia Type: General Level of consciousness: sedated and patient remains intubated per anesthesia plan Pain management: pain level controlled Vital Signs Assessment: post-procedure vital signs reviewed and stable Respiratory status: patient remains intubated per anesthesia plan and patient on ventilator - see flowsheet for VS Cardiovascular status: stable Postop Assessment: no apparent nausea or vomiting Anesthetic complications: no    Last Vitals:  Vitals:   03/02/18 1500 03/02/18 1525  BP: (!) 89/59   Pulse: 89   Resp: 12   Temp: (!) 36.1 C   SpO2: 100% 100%    Last Pain:  Vitals:   03/02/18 1500  TempSrc: Core (Comment)  PainSc:                  Deborah [name]

## 2018-03-02 NOTE — Anesthesia Preprocedure Evaluation (Signed)
Anesthesia Evaluation  Patient identified by MRN, date of birth, ID band Patient awake    Reviewed: Allergy & Precautions, NPO status , Patient's Chart, lab work & pertinent test results  Airway Mallampati: II  TM Distance: >3 FB Neck ROM: Full    Dental  (+) Teeth Intact, Dental Advisory Given   Pulmonary former smoker,    breath sounds clear to auscultation       Cardiovascular  Rhythm:Regular Rate:Normal     Neuro/Psych    GI/Hepatic   Endo/Other    Renal/GU      Musculoskeletal   Abdominal   Peds  Hematology   Anesthesia Other Findings   Reproductive/Obstetrics                             Anesthesia Physical Anesthesia Plan  ASA: III  Anesthesia Plan: General   Post-op Pain Management:    Induction: Intravenous  PONV Risk Score and Plan: Ondansetron and Dexamethasone  Airway Management Planned: Oral ETT  Additional Equipment: Arterial line, CVP, 3D TEE, Ultrasound Guidance Line Placement and PA Cath  Intra-op Plan:   Post-operative Plan: Post-operative intubation/ventilation  Informed Consent: I have reviewed the patients History and Physical, chart, labs and discussed the procedure including the risks, benefits and alternatives for the proposed anesthesia with the patient or authorized representative who has indicated his/her understanding and acceptance.   Dental advisory given  Plan Discussed with: CRNA and Anesthesiologist  Anesthesia Plan Comments:         Anesthesia Quick Evaluation

## 2018-03-02 NOTE — Anesthesia Procedure Notes (Signed)
Central Venous Catheter Insertion Performed by: Roberts Gaudy, MD, anesthesiologist Start/End7/10/2017 6:50 AM, 03/02/2018 7:00 AM Patient location: OR. Preanesthetic checklist: patient identified, IV checked, site marked, risks and benefits discussed, surgical consent, monitors and equipment checked, pre-op evaluation, timeout performed and anesthesia consent Lidocaine 1% used for infiltration and patient sedated Hand hygiene performed  and maximum sterile barriers used  Catheter size: 8.5 Fr Sheath introducer Procedure performed using ultrasound guided technique. Ultrasound Notes:anatomy identified, needle tip was noted to be adjacent to the nerve/plexus identified, no ultrasound evidence of intravascular and/or intraneural injection and image(s) printed for medical record Attempts: 1 Following insertion, line sutured and dressing applied. Post procedure assessment: blood return through all ports, free fluid flow and no air  Patient tolerated the procedure well with no immediate complications.

## 2018-03-02 NOTE — Progress Notes (Signed)
  Echocardiogram Echocardiogram Transesophageal has been performed.  Darlina Sicilian M 03/02/2018, 9:43 AM

## 2018-03-02 NOTE — Anesthesia Procedure Notes (Signed)
Central Venous Catheter Insertion Performed by: Roberts Gaudy, MD, anesthesiologist Start/End7/10/2017 6:50 AM, 03/02/2018 7:00 AM Patient location: Pre-op. Preanesthetic checklist: patient identified, IV checked, site marked, risks and benefits discussed, surgical consent, monitors and equipment checked, pre-op evaluation, timeout performed and anesthesia consent Lidocaine 1% used for infiltration and patient sedated Hand hygiene performed  and maximum sterile barriers used  Catheter size: 8 Fr Total catheter length 16. Central line was placed.Double lumen Procedure performed using ultrasound guided technique. Ultrasound Notes:image(s) printed for medical record Attempts: 1 Following insertion, dressing applied and line sutured. Post procedure assessment: blood return through all ports  Patient tolerated the procedure well with no immediate complications.

## 2018-03-02 NOTE — Transfer of Care (Signed)
Immediate Anesthesia Transfer of Care Note  Patient: Deborah [name]  Procedure(s) Performed: CORONARY ARTERY BYPASS GRAFTING (CABG) (N/A Chest) TRANSESOPHAGEAL ECHOCARDIOGRAM (TEE) (N/A )  Patient Location: SICU  Anesthesia Type:General  Level of Consciousness: Patient remains intubated per anesthesia plan  Airway & Oxygen Therapy: Patient remains intubated per anesthesia plan and Patient placed on Ventilator (see vital sign flow sheet for setting)  Post-op Assessment: Report given to RN and Post -op Vital signs reviewed and stable  Post vital signs: Reviewed and stable  Last Vitals:  Vitals Value Taken Time  BP    Temp 36.1 C 03/02/2018  1:24 PM  Pulse 89 03/02/2018  1:24 PM  Resp 12 03/02/2018  1:24 PM  SpO2 100 % 03/02/2018  1:24 PM  Vitals shown include unvalidated device data.  Last Pain:  Vitals:   03/02/18 0451  TempSrc: Oral  PainSc:       Patients Stated Pain Goal: 0 (43/27/61 4709)  Complications: No apparent anesthesia complications

## 2018-03-02 NOTE — Progress Notes (Signed)
      [address]       [place] [address]             [phone]    Pre Procedure note for inpatients:   Deborah [name] has been scheduled for Procedure(s): CORONARY ARTERY BYPASS GRAFTING (CABG) (N/A) TRANSESOPHAGEAL ECHOCARDIOGRAM (TEE) (N/A) today. The various methods of treatment have been discussed with the patient. After consideration of the risks, benefits and treatment options the patient has consented to the planned procedure.   The patient has been seen and labs reviewed. There are no changes in the patient's condition to prevent proceeding with the planned procedure today.  Recent labs:  Lab Results  Component Value Date   WBC 6.9 03/01/2018   HGB 12.7 03/01/2018   HCT 38.4 03/01/2018   PLT 221 03/01/2018   GLUCOSE 94 03/01/2018   CHOL 161 02/27/2018   TRIG 45 02/27/2018   HDL 64 02/27/2018   LDLCALC 88 02/27/2018   ALT 17 03/01/2018   AST 21 03/01/2018   NA 137 03/01/2018   K 4.0 03/01/2018   CL 103 03/01/2018   CREATININE 0.69 03/01/2018   BUN 11 03/01/2018   CO2 27 03/01/2018   INR 1.13 03/01/2018   HGBA1C 5.4 03/02/2018    Grace Isaac, MD 03/02/2018 7:07 AM

## 2018-03-02 NOTE — Addendum Note (Signed)
Addendum  created 03/02/18 1727 by Roberts Gaudy, MD   Diagnosis association updated

## 2018-03-02 NOTE — Brief Op Note (Signed)
      [address]       [place] [address]             [phone]      03/02/2018  1:55 PM  PATIENT:  Deborah [name]  72 y.o. female  PRE-OPERATIVE DIAGNOSIS:  CAD  POST-OPERATIVE DIAGNOSIS:  CAD  PROCEDURE:  Procedure(s) with comments: CORONARY ARTERY BYPASS GRAFTING (CABG) (N/A) - Times 3 using left internal mammary artery to LAD and endoscopically harvested left saphenous vein to Diagonal and PDA. TRANSESOPHAGEAL ECHOCARDIOGRAM (TEE) (N/A)  SURGEON:  Surgeon(s) and Role:    * Grace Isaac, MD - Primary  PHYSICIAN ASSISTANT: Tacy Dura    ANESTHESIA:   general  EBL:  900 mL   BLOOD ADMINISTERED:none  DRAINS: Urinary Catheter (Foley) and left chest tube and mediastinal tube  Chest Tube(s) in the left chest    LOCAL MEDICATIONS USED:  NONE  SPECIMEN:  Source of Specimen:  left mammary lymph node  DISPOSITION OF SPECIMEN:  PATHOLOGY  COUNTS:  YES   DICTATION: .Dragon Dictation  PLAN OF CARE: transfer to icu  PATIENT DISPOSITION:  ICU - intubated and hemodynamically stable.   Delay start of Pharmacological VTE agent (>24hrs) due to surgical blood loss or risk of bleeding: yes

## 2018-03-03 ENCOUNTER — Inpatient Hospital Stay (HOSPITAL_COMMUNITY): Payer: Medicare Other

## 2018-03-03 ENCOUNTER — Encounter (HOSPITAL_COMMUNITY): Payer: Self-pay | Admitting: Cardiothoracic Surgery

## 2018-03-03 DIAGNOSIS — I2510 Atherosclerotic heart disease of native coronary artery without angina pectoris: Secondary | ICD-10-CM

## 2018-03-03 DIAGNOSIS — I25709 Atherosclerosis of coronary artery bypass graft(s), unspecified, with unspecified angina pectoris: Secondary | ICD-10-CM

## 2018-03-03 LAB — CREATININE, SERUM
Creatinine, Ser: 0.64 mg/dL (ref 0.44–1.00)
GFR calc Af Amer: >60 mL/min (ref >60)
GFR calc non Af Amer: >60 mL/min (ref >60)

## 2018-03-03 LAB — POCT I-STAT, CHEM 8
BUN: 11 mg/dL (ref 8–23)
Calcium, Ion: 1.22 mmol/L (ref 1.15–1.40)
Chloride: 99 mmol/L (ref 98–111)
Creatinine, Ser: 0.5 mg/dL (ref 0.44–1.00)
Glucose, Bld: 136 mg/dL — ABNORMAL HIGH (ref 70–99)
HEMATOCRIT: 27 % — AB (ref 36.0–46.0)
HEMOGLOBIN: 9.2 g/dL — AB (ref 12.0–15.0)
POTASSIUM: 4.3 mmol/L (ref 3.5–5.1)
SODIUM: 133 mmol/L — AB (ref 135–145)
TCO2: 22 mmol/L (ref 22–32)

## 2018-03-03 LAB — BASIC METABOLIC PANEL
Anion gap: 6 (ref 5–15)
BUN: 9 mg/dL (ref 8–23)
CO2: 22 mmol/L (ref 22–32)
Calcium: 7.8 mg/dL — ABNORMAL LOW (ref 8.9–10.3)
Chloride: 108 mmol/L (ref 98–111)
Creatinine, Ser: 0.58 mg/dL (ref 0.44–1.00)
GFR calc Af Amer: >60 mL/min (ref >60)
GFR calc non Af Amer: >60 mL/min (ref >60)
Glucose, Bld: 117 mg/dL — ABNORMAL HIGH (ref 70–99)
Potassium: 4.1 mmol/L (ref 3.5–5.1)
Sodium: 136 mmol/L (ref 135–145)

## 2018-03-03 LAB — CBC
HCT: 27.4 % — ABNORMAL LOW (ref 36.0–46.0)
HEMATOCRIT: 27.2 % — AB (ref 36.0–46.0)
HEMOGLOBIN: 8.8 g/dL — AB (ref 12.0–15.0)
Hemoglobin: 8.9 g/dL — ABNORMAL LOW (ref 12.0–15.0)
MCH: 32 pg (ref 26.0–34.0)
MCH: 32.1 pg (ref 26.0–34.0)
MCHC: 32.4 g/dL (ref 30.0–36.0)
MCHC: 32.5 g/dL (ref 30.0–36.0)
MCV: 98.9 fL (ref 78.0–100.0)
MCV: 98.9 fL (ref 78.0–100.0)
Platelets: 107 10*3/uL — ABNORMAL LOW (ref 150–400)
Platelets: 95 10*3/uL — ABNORMAL LOW (ref 150–400)
RBC: 2.75 MIL/uL — ABNORMAL LOW (ref 3.87–5.11)
RBC: 2.77 MIL/uL — ABNORMAL LOW (ref 3.87–5.11)
RDW: 13 % (ref 11.5–15.5)
RDW: 12.9 % (ref 11.5–15.5)
WBC: 10.5 10*3/uL (ref 4.0–10.5)
WBC: 9.5 10*3/uL (ref 4.0–10.5)

## 2018-03-03 LAB — GLUCOSE, CAPILLARY
GLUCOSE-CAPILLARY: 113 mg/dL — AB (ref 70–99)
GLUCOSE-CAPILLARY: 119 mg/dL — AB (ref 70–99)
GLUCOSE-CAPILLARY: 132 mg/dL — AB (ref 70–99)
GLUCOSE-CAPILLARY: 93 mg/dL (ref 70–99)
Glucose-Capillary: 102 mg/dL — ABNORMAL HIGH (ref 70–99)
Glucose-Capillary: 98 mg/dL (ref 70–99)

## 2018-03-03 LAB — HEPARIN LEVEL (UNFRACTIONATED): Heparin Unfractionated: <0.1 IU/mL — ABNORMAL LOW (ref 0.30–0.70)

## 2018-03-03 LAB — PROTIME-INR
INR: 1.38
Prothrombin Time: 16.9 seconds — ABNORMAL HIGH (ref 11.4–15.2)

## 2018-03-03 LAB — APTT: APTT: 32 s (ref 24–36)

## 2018-03-03 LAB — MAGNESIUM
Magnesium: 2.2 mg/dL (ref 1.7–2.4)
Magnesium: 2.1 mg/dL (ref 1.7–2.4)

## 2018-03-03 MED ORDER — INSULIN ASPART 100 UNIT/ML ~~LOC~~ SOLN
0.0000 [IU] | SUBCUTANEOUS | Status: DC
  Administered 2018-03-03: 2 [IU] via SUBCUTANEOUS

## 2018-03-03 MED ORDER — METOCLOPRAMIDE HCL 5 MG/ML IJ SOLN
5.0000 mg | Freq: Three times a day (TID) | INTRAMUSCULAR | Status: AC
  Administered 2018-03-03 (×3): 5 mg via INTRAVENOUS
  Filled 2018-03-03 (×2): qty 2

## 2018-03-03 MED ORDER — ENOXAPARIN SODIUM 30 MG/0.3ML ~~LOC~~ SOLN
30.0000 mg | Freq: Every day | SUBCUTANEOUS | Status: DC
  Administered 2018-03-03: 30 mg via SUBCUTANEOUS
  Filled 2018-03-03: qty 0.3

## 2018-03-03 MED FILL — Magnesium Sulfate Inj 50%: INTRAMUSCULAR | Qty: 10 | Status: AC

## 2018-03-03 MED FILL — Potassium Chloride Inj 2 mEq/ML: INTRAVENOUS | Qty: 40 | Status: AC

## 2018-03-03 MED FILL — Heparin Sodium (Porcine) Inj 1000 Unit/ML: INTRAMUSCULAR | Qty: 30 | Status: AC

## 2018-03-03 NOTE — Progress Notes (Signed)
CARDIOLOGY FOLLOW-UP   Awake and alert post surgery.  Atrial paced rhythm.  Neurologically intact  EKG without evidence of injury.  Early T wave inversions post surgery yesterday have resolved to a more normal appearance this morning.  We will follow.

## 2018-03-03 NOTE — Discharge Instructions (Signed)
Coronary Artery Bypass Grafting, Care After °These instructions give you information on caring for yourself after your procedure. Your doctor may also give you more specific instructions. Call your doctor if you have any problems or questions after your procedure. °Follow these instructions at home: °· Only take medicine as told by your doctor. Take medicines exactly as told. Do not stop taking medicines or start any new medicines without talking to your doctor first. °· Take your pulse as told by your doctor. °· Do deep breathing as told by your doctor. Use your breathing device (incentive spirometer), if given, to practice deep breathing several times a day. Support your chest with a pillow or your arms when you take deep breaths or cough. °· Keep the area clean, dry, and protected where the surgery cuts (incisions) were made. Remove bandages (dressings) only as told by your doctor. If strips were applied to surgical area, do not take them off. They fall off on their own. °· Check the surgery area daily for puffiness (swelling), redness, or leaking fluid. °· If surgery cuts were made in your legs: °? Avoid crossing your legs. °? Avoid sitting for long periods of time. Change positions every 30 minutes. °? Raise your legs when you are sitting. Place them on pillows. °· Wear stockings that help keep blood clots from forming in your legs (compression stockings). °· Only take sponge baths until your doctor says it is okay to take showers. Pat the surgery area dry. Do not rub the surgery area with a washcloth or towel. Do not bathe, swim, or use a hot tub until your doctor says it is okay. °· Eat foods that are high in fiber. These include raw fruits and vegetables, whole grains, beans, and nuts. Choose lean meats. Avoid canned, processed, and fried foods. °· Drink enough fluids to keep your pee (urine) clear or pale yellow. °· Weigh yourself every day. °· Rest and limit activity as told by your doctor. You may be told  to: °? Stop any activity if you have chest pain, shortness of breath, changes in heartbeat, or dizziness. Get help right away if this happens. °? Move around often for short amounts of time or take short walks as told by your doctor. Gradually become more active. You may need help to strengthen your muscles and build endurance. °? Avoid lifting, pushing, or pulling anything heavier than 10 pounds (4.5 kg) for at least 6 weeks after surgery. °· Do not drive until your doctor says it is okay. °· Ask your doctor when you can go back to work. °· Ask your doctor when you can begin sexual activity again. °· Follow up with your doctor as told. °Contact a doctor if: °· You have puffiness, redness, more pain, or fluid draining from the incision site. °· You have a fever. °· You have puffiness in your ankles or legs. °· You have pain in your legs. °· You gain 2 or more pounds (0.9 kg) a day. °· You feel sick to your stomach (nauseous) or throw up (vomit). °· You have watery poop (diarrhea). °Get help right away if: °· You have chest pain that goes to your jaw or arms. °· You have shortness of breath. °· You have a fast or irregular heartbeat. °· You notice a "clicking" in your breastbone when you move. °· You have numbness or weakness in your arms or legs. °· You feel dizzy or light-headed. °This information is not intended to replace advice given to you by   your health care provider. Make sure you discuss any questions you have with your health care provider. °Document Released: 08/23/2013 Document Revised: 01/24/2016 Document Reviewed: 01/25/2013 °Elsevier Interactive Patient Education © 2017 [employer] ° °

## 2018-03-03 NOTE — Progress Notes (Signed)
Anesthesiology Follow-up:  Awake and alert, neuro intact, having mild nausea, minimal pain  VS: T- 36.8 BP- 134/50 HR- 80 (atrially paced) RR 17 O2 Sat 100% on 2L PA- 22/12   Labs- K-4.1 Na- 136 BUN/Cr. 9/0.58 glucose- 117 H/H- 8.8/27 Platelets- 107,000  Extubated 3 1/2 hours post-op.  72 year old female with preserved LV function, one day S/P CABG X 3 Stable post-op course no apparent complications.  Roberts Gaudy

## 2018-03-03 NOTE — Progress Notes (Signed)
Patient ID: Deborah [name], female   DOB: May 07, 1946, 72 y.o.   MRN: [id number] TCTS DAILY ICU PROGRESS NOTE                   [address]            [place] [address]          [phone]   1 Day Post-Op Procedure(s) (LRB): CORONARY ARTERY BYPASS GRAFTING (CABG) (N/A) TRANSESOPHAGEAL ECHOCARDIOGRAM (TEE) (N/A)  Total Length of Stay:  LOS: 5 days   Subjective: Awake alert neurologically intact extubated last night without difficulty  Objective: Vital signs in last 24 hours: Temp:  [96.6 F (35.9 C)-100.4 F (38 C)] 99.7 F (37.6 C) (07/03 0700) Pulse Rate:  [88-93] 89 (07/03 0700) Cardiac Rhythm: Atrial paced (07/03 0400) Resp:  [9-30] 21 (07/03 0700) BP: (84-125)/(47-76) 101/61 (07/03 0700) SpO2:  [98 %-100 %] 100 % (07/03 0700) Arterial Line BP: (95-160)/(34-71) 122/48 (07/03 0700) FiO2 (%):  [40 %-50 %] 40 % (07/02 1615) Weight:  [109 lb 5.6 oz (49.6 kg)] 109 lb 5.6 oz (49.6 kg) (07/03 0500)  Filed Weights   03/01/18 0549 03/02/18 0448 03/03/18 0500  Weight: 97 lb 4.8 oz (44.1 kg) 97 lb 6.4 oz (44.2 kg) 109 lb 5.6 oz (49.6 kg)    Weight change: 11 lb 15.2 oz (5.42 kg)   Hemodynamic parameters for last 24 hours: PAP: (10-36)/(3-24) 23/12 CO:  [2.5 L/min-4.7 L/min] 4.7 L/min CI:  [1.7 L/min/m2-3.3 L/min/m2] 3.3 L/min/m2  Intake/Output from previous day: 07/02 0701 - 07/03 0700 In: 5240.9 [I.V.:3432.5; Blood:410; NG/GT:30; IV Piggyback:1368.4] Out: 2925 [Urine:1505; Blood:900; Chest Tube:520]  Intake/Output this shift: No intake/output data recorded.  Current Meds: Scheduled Meds: . sodium chloride   Intravenous Once  . acetaminophen  1,000 mg Oral Q6H   Or  . acetaminophen (TYLENOL) oral liquid 160 mg/5 mL  1,000 mg Per Tube Q6H  . aspirin EC  325 mg Oral Daily   Or  . aspirin  324 mg Per Tube Daily  . aspirin EC  81 mg Oral Daily  . bisacodyl  10 mg Oral Daily   Or  . bisacodyl  10 mg Rectal Daily  . docusate sodium  200 mg Oral  Daily  . insulin aspart  0-24 Units Subcutaneous Q4H  . methimazole  5 mg Oral Once per day on Mon Tue Wed Thu Fri Sat  . metoprolol tartrate  12.5 mg Oral BID   Or  . metoprolol tartrate  12.5 mg Per Tube BID  . metoprolol tartrate  12.5 mg Oral BID  . multivitamin with minerals  1 tablet Oral Daily  . [START ON 03/04/2018] pantoprazole  40 mg Oral Daily  . pravastatin  40 mg Oral QHS  . sodium chloride flush  3 mL Intravenous Q12H  . sodium chloride flush  3 mL Intravenous Q12H   Continuous Infusions: . sodium chloride 20 mL/hr at 03/03/18 0700  . sodium chloride Stopped (03/02/18 0509)  . sodium chloride    . sodium chloride 10 mL/hr at 03/02/18 1305  .  ceFAZolin (ANCEF) IV Stopped (03/03/18 0615)  . dexmedetomidine (PRECEDEX) IV infusion Stopped (03/02/18 1501)  . lactated ringers    . lactated ringers Stopped (03/02/18 1431)  . lactated ringers 20 mL/hr at 03/03/18 0700  . nitroGLYCERIN Stopped (03/02/18 1843)  . phenylephrine (NEO-SYNEPHRINE) Adult infusion 30 mcg/min (03/03/18 0700)   PRN Meds:.sodium chloride, sodium chloride, acetaminophen, lactated ringers, metoprolol tartrate, midazolam, morphine injection,  nitroGLYCERIN, ondansetron (ZOFRAN) IV, ondansetron (ZOFRAN) IV, oxyCODONE, sodium chloride flush, sodium chloride flush, traMADol  General appearance: alert, cooperative and no distress Neurologic: intact Heart: regular rate and rhythm, S1, S2 normal, no murmur, click, rub or gallop Lungs: diminished breath sounds bibasilar Abdomen: soft, non-tender; bowel sounds normal; no masses,  no organomegaly Extremities: extremities normal, atraumatic, no cyanosis or edema and Homans sign is negative, no sign of DVT Wound: Sternum stable  Lab Results: CBC: Recent Labs    03/02/18 1848 03/02/18 1855 03/03/18 0331  WBC 8.6  --  10.5  HGB 9.1* 8.5* 8.8*  HCT 28.4* 25.0* 27.2*  PLT 96*  --  107*   BMET:  Recent Labs    03/02/18 0553  03/02/18 1855 03/03/18 0331    NA 138   < > 139 136  K 3.7   < > 4.2 4.1  CL 104   < > 107 108  CO2 24  --   --  22  GLUCOSE 97   < > 114* 117*  BUN 7*   < > 8 9  CREATININE 0.72   < > 0.40* 0.58  CALCIUM 9.3  --   --  7.8*   < > = values in this interval not displayed.    CMET: Lab Results  Component Value Date   WBC 10.5 03/03/2018   HGB 8.8 (L) 03/03/2018   HCT 27.2 (L) 03/03/2018   PLT 107 (L) 03/03/2018   GLUCOSE 117 (H) 03/03/2018   CHOL 161 02/27/2018   TRIG 45 02/27/2018   HDL 64 02/27/2018   LDLCALC 88 02/27/2018   ALT 17 03/01/2018   AST 21 03/01/2018   NA 136 03/03/2018   K 4.1 03/03/2018   CL 108 03/03/2018   CREATININE 0.58 03/03/2018   BUN 9 03/03/2018   CO2 22 03/03/2018   INR 1.38 03/03/2018   HGBA1C 5.4 03/02/2018      PT/INR:  Recent Labs    03/03/18 0331  LABPROT 16.9*  INR 1.38   Radiology: Dg Chest Port 1 View  Result Date: 03/03/2018 CLINICAL DATA:  Postop check EXAM: PORTABLE CHEST 1 VIEW COMPARISON:  Yesterday FINDINGS: Interval extubation. Mild atelectasis at the left base. Thoracic drains in good position. No visible pneumothorax. Swan-Ganz catheter from the right with tip at the main pulmonary artery. Right IJ line with tip at the upper cavoatrial junction. No cardiomegaly. CABG changes. IMPRESSION: Mild atelectasis.  No visible pneumothorax. Electronically Signed   By: Monte Fantasia M.D.   On: 03/03/2018 07:59   Dg Chest Port 1 View  Result Date: 03/02/2018 CLINICAL DATA:  Status post coronary bypass graft. EXAM: PORTABLE CHEST 1 VIEW COMPARISON:  Radiograph of February 26, 2018. FINDINGS: The heart size and mediastinal contours are within normal limits. Endotracheal tube is in grossly good position. Distal tip of nasogastric tube is seen in proximal stomach. Right internal jugular Swan-Ganz catheter is noted with tip in expected position of main pulmonary artery. Left-sided chest tube is noted without pneumothorax. Right lung is clear. No pleural effusion is noted. The  visualized skeletal structures are unremarkable. IMPRESSION: Endotracheal tube in grossly good position. Distal tip of nasogastric tube seen in proximal stomach. Left-sided chest tube is noted without pneumothorax. No acute pulmonary abnormality is noted. Electronically Signed   By: Marijo Conception, M.D.   On: 03/02/2018 14:27     Assessment/Plan: S/P Procedure(s) (LRB): CORONARY ARTERY BYPASS GRAFTING (CABG) (N/A) TRANSESOPHAGEAL ECHOCARDIOGRAM (TEE) (N/A) Mobilize Diuresis d/c tubes/lines [name]  progression orders Renal function stable Expected blood loss anemia, in spite of her small body size so far she has not needed blood transfusion    Grace Isaac 03/03/2018 8:19 AM

## 2018-03-03 NOTE — Op Note (Signed)
NAME: SKI, POLICH MEDICAL RECORD JW:[id number] ACCOUNT [id number] DATE OF BIRTH:1945/11/07 FACILITY: [facility] LOCATION: [facility] PHYSICIAN:Juliann Olesky Maryruth Bun, MD  OPERATIVE REPORT  DATE OF PROCEDURE:  03/02/2018  PREOPERATIVE DIAGNOSIS:  Preinfarctional angina and high grade left anterior descending and right coronary artery disease.  POSTOPERATIVE DIAGNOSIS:  Preinfarctional angina and high grade left anterior descending and right coronary artery disease.  SURGICAL PROCEDURE:  Coronary artery bypass grafting x3 with the left internal mammary to the left anterior descending coronary artery, reverse saphenous vein graft to the diagonal coronary artery, reverse saphenous vein graft to the posterior descending  coronary artery with left leg greater saphenous thigh endovein harvesting and incidental lymph node biopsy of the left internal mammary artery chain.  SURGEON:  Lanelle Bal, MD.  FIRST ASSISTANT:  Lars Pinks PA.  BRIEF HISTORY:  The patient is a 72 year old female with no previous coronary artery history who presents with several days of increasing severe chest discomfort who ultimately came to the emergency room and was admitted.  She underwent cardiac  catheterization, which demonstrated high grade complex LAD diagonal disease and high-grade obstruction of the right coronary artery, which was a large dominant system.  The circumflex coronary artery had luminal irregularities, but no high grade  stenosis.  Overall, ventricular function was preserved.  Because of the patient's rapidly escalating symptoms and severe LAD and right coronary artery disease, coronary artery bypass grafting was recommended to the patient who agreed and signed informed  consent.  DESCRIPTION OF PROCEDURE:  With Swan-Ganz and arterial line monitors in placed, the patient underwent general endotracheal anesthesia without incident.  Skin of the chest and legs was prepped with Betadine and draped in the  usual sterile manner.   Appropriate timeout was performed.  Initially, we made a small incision at the right knee and located the saphenous vein.  Through a small incision this vein looked relatively small, so we moved to the left leg and a similar incision was made at the knee  and the saphenous vein on the left side appeared more suitable and was endoscopically harvested from the left thigh.  The vein was of excellent quality and caliber.  Median sternotomy was performed.  The left internal mammary artery was dissected down  to the pedicle graft.  The distal artery was divided and had good free flow.  Pericardium was opened.  Overall, ventricular function was preserved.  The patient's aorta was slightly enlarged.  Preoperative CT scan indicated approximately 3.8 cm.  She was  systemically heparinized.  The ascending aorta was cannulated.  The right atrium was cannulated.  An aortic root vent cardioplegia needle was introduced into the ascending aorta.  The patient was placed on cardiopulmonary bypass 2.4 liters per minute  per mm2.  Sites of anastomosis were selected and dissected at the epicardium.  The patient's body temperature was cooled to 32 degrees.  Aortic crossclamp was applied and 500 mL of cold blood potassium cardioplegia was administered with diastolic arrest  of the heart.  Myocardial septal temperature was monitored throughout the crossclamp.  We turned our attention first to the distal right coronary artery and proximal posterior descending.  The posterior descending coronary artery was of good size and  relatively free of disease.  The distal right coronary artery was calcified.  The posterior descending was opened.  It was a good sized vessel, easily admitting a 1.5 mm probe.  Using a running 7-0 Prolene, distal anastomosis was performed with a segment  of reverse saphenous vein  graft.  We then turned our attention to the diagonal coronary artery which was approximately 1.2-1.3 mm in  size.  The vessel was opened and using a running 7-0 Prolene, distal anastomosis was performed with a segment of reverse  saphenous vein graft.  In the mid left anterior descending coronary artery, the LAD was opened and admitted a 1.5 mm probe proximally and distally.  Using a running 8-0 Prolene the left internal mammary artery was anastomosed to left anterior descending  coronary artery.  With cross clamp still in place, 2 punch aortotomies were performed and each of the 2 vein grafts were anastomosed to the ascending aorta.  The bulldog on the mammary artery was removed with prompt rise in myocardial septal  temperature.  The heart was allowed to passively fill and deair.  The proximal anastomoses were completed and the aortic crossclamp was removed.  Total crossclamp time of 88 minutes.  The patient spontaneously converted to a sinus rhythm.  Sites of  anastomosis were inspected and free of bleeding.  Atrial and ventricular pacing wires were applied.  The patient's body temperature rewarmed to 37 degrees.  She was ventilated and weaned from cardiopulmonary bypass without difficulty.  She remained  hemodynamically stable.  She was decannulated in the usual fashion.  Protamine sulfate was administered.  With the operative field hemostatic, left pleural tube and Blake mediastinal drain were left in place.  The pericardium was loosely reapproximated.   Sternum was closed with #6 stainless steel wire.  Fascia closed with interrupted 0 Vicryl, running 3-0 Vicryl, subcutaneous tissue for a subcuticular stitch on skin edges.  Dry dressings were applied.  Sponge and needle count was reported as correct.   Although the patient had a small body surface area of 1.44, she did not require any blood bank blood products during the operative procedure.  It should be noted that while dissecting down the internal mammary artery, there was a moderately enlarged  lymph node along the mammary chain.  This was sent to  pathology to check as an incidental left mammary node.  The pathology was benign.  TN/NUANCE  D:03/03/2018 T:03/03/2018 JOB:[id number]

## 2018-03-03 NOTE — Discharge Summary (Signed)
Physician Discharge Summary       [address]       [place] [address]             [phone]    Patient ID: Deborah [name] MRN: [id number] DOB/AGE: October 05, 1945 72 y.o.  Admit date: 02/26/2018 Discharge date: 03/08/2018  Admission Diagnoses: 1.  Unstable angina ([place]) 2. NSTEMI (non-ST elevated myocardial infarction) ([place]) 3. Coronary artery disease   Discharge Diagnoses:  1. S/P CABG x 3 2. ABL anemia 3. History of Hyperlipemia 4. History of thyroid disease     Procedure (s):  Leonie Man, MD (Primary) on 02/26/2018:    Procedures   LEFT HEART CATH AND CORONARY ANGIOGRAPHY  Conclusion     Culprit lesion: Mid RCA lesion is 95% stenosed. Heavily calcified irregular lesion  Prox RCA to Mid RCA lesion is 25% stenosed. Dist RCA lesion is 45% stenosed.  Prox LAD-1 lesion is 85% stenosed. - Prox LAD-2 lesion is 55% stenosed. - Prox LAD-3 lesion is 70% stenosed. (tandem lesions)  The left ventricular systolic function is normal. The left ventricular ejection fraction is greater than 65% by visual estimate.  LV end diastolic pressure is normal.  There is mild (2+) mitral regurgitation.   Heavily calcified coronary arteries with severe mid RCA 99% stenosis and diffuse 70 to 80% ostial-proximal LAD stenosis. Normal LV function and EDP.  Patient has severe disease.  We will consult CT surgery.  She will be admitted to the stepdown unit.  Placed on IV heparin.  If necessary IV nitroglycerin.  Continue risk factor modification - aspirin, statin, beta-blocker etc.      Coronary artery bypass grafting x3 with the left internal mammary to the left anterior descending coronary artery, reverse saphenous vein graft to the diagonal coronary artery, reverse saphenous vein graft to the posterior descending  coronary artery with left leg greater saphenous thigh endovein harvesting and incidental lymph node biopsy of the left internal mammary artery  chain.  History of Presenting Illness: The patient is a 72 year old female who presented to the emergency department with symptoms of classic unstable angina.  Initial EKG was without evidence of acute STEMI.  He did have a mildly elevated troponin I of 0.05.  He developed chest pain approximately 48 hours prior to presentation.  The symptoms began last Wednesday evening when she took out her garbage and developed some shortness of breath and mild chest tightness which she has not experienced previously.  The symptoms did quickly resolve with stopping the activity.  The following day she developed central chest pressure and weakness on the backside of her arms as well as significant fatigue and mild shortness of breath while working in her garden.  She sat down and gain relief of symptoms after approximately 2 hours.  She had no further symptoms until the following morning when she again developed central chest pressure which radiated to the right arm and mid back along with aching in both arms mild, shortness of breath, nausea, lightheadedness and diaphoresis. She was brought to the emergency department and given sublingual  nitroglycerineand the symptoms resolved.  She was felt to require admission for further evaluation and treatment to include cardiology consultation.  Symptoms were felt to be very evident for unstable angina/acute coronary syndrome.  A CTA of the chest was also done and showed no evidence of aortic dissection or acute intramural hematoma.  She did have findings on the CT scan of atherosclerotic calcifications in the aortic arch  and great vessels and moderate three-vessel coronary artery calcification.  She was placed on intravenous heparin and cardiac catheterization was scheduled.  She was found to have severe 2 vessel coronary artery disease- not suitable for angioplasty/stent .  Dr. Newell Coral was  asked to see the patient in cardiothoracic surgical consultation for consideration of  coronary artery surgical revascularization. Potential risks, benefits, and complications of the surgery were discussed with the patient and she agreed to proceed. Pre operative carotid duplex US showed no significant internal carotid artery stenosis bilaterally. She underwent a CABG x 3 on 03/02/2018.  Brief Hospital Course:  The patient was extubated the afternoon of surgery without difficulty. She remained afebrile and hemodynamically stable. Gordy Councilman, a line, chest tubes, and foley were removed early in the post operative course. Lopressor was started and titrated accordingly. She was volume over loaded and diuresed. She had ABL anemia. She did not require a post op transfusion. Last H and H was 8.9 and 27.2. She had mild thrombocytopenia-platelets were up to 107,000 on 03/05/2018. She was weaned off the insulin drip.  The patient's HGA1C pre op was 5.4. The patient was felt surgically stable for transfer from the ICU to PCTU for further convalescence on 03/04/2018. She continues to progress with cardiac rehab. She was ambulating on room air. She has been tolerating a diet and has had a bowel movement. Epicardial pacing wires were removed on 03/05/2018. Chest tube sutures will be removed the day of discharge. The patient is felt surgically stable for discharge today with home aidetoday.   Latest Vital Signs: Blood pressure 117/76, pulse 89, temperature 99.2 F (37.3 C), temperature source Oral, resp. rate (!) 24, height 5\' 4"  (1.626 m), weight 44.2 kg (97 lb 7.1 oz), SpO2 91 %.  Physical Exam: Cardiovascular: RRR Pulmonary: Slightly diminished at bases Abdomen: Soft, non tender, bowel sounds present. Extremities: Mild bilateral lower extremity edema. Wounds: Sternal wound is clean and dry.  No erythema or signs of infection.    Discharge Condition:Stable and discharged to home.  Recent laboratory studies:  Lab Results  Component Value Date   WBC 11.9 (H) 03/04/2018   HGB 8.9 (L)  03/04/2018   HCT 27.2 (L) 03/04/2018   MCV 100.4 (H) 03/04/2018   PLT 107 (L) 03/04/2018   Lab Results  Component Value Date   NA 132 (L) 03/04/2018   K 4.1 03/04/2018   CL 102 03/04/2018   CO2 24 03/04/2018   CREATININE 0.59 03/04/2018   GLUCOSE 102 (H) 03/04/2018    Diagnostic Studies: Dg Chest 2 View  Result Date: 02/26/2018 CLINICAL DATA:  Chest pain.  Nausea. EXAM: CHEST - 2 VIEW COMPARISON:  Chest CTA today. Low-dose lung cancer screening chest CT 05/18/2017. FINDINGS: The cardiomediastinal silhouette is within normal limits. Aortic atherosclerosis is noted. The lungs are hyperinflated with a small amount of mildly increased posterior basilar density on the lateral radiograph, favored to represent subsegmental atelectasis given appearance on today's CTA. No pleural effusion or pneumothorax is identified. An old left clavicle fracture is noted. IMPRESSION: Hyperinflation.  Mild bibasilar atelectasis. Electronically Signed   By: Logan Bores M.D.   On: 02/26/2018 09:32   Dg Chest Port 1 View  Result Date: 03/04/2018 CLINICAL DATA:  History of recent bypass grafting EXAM: PORTABLE CHEST 1 VIEW COMPARISON:  03/03/2018 FINDINGS: Cardiac shadow is stable. Postsurgical changes are again seen. Swan-Ganz catheter has been removed although the jugular sheath remains on the right. Right jugular central line is again seen. Bilateral  pleural effusions right greater than left are noted which may be positional in nature. Minimal left basilar atelectasis is noted. No pneumothorax is noted. IMPRESSION: Mild left basilar atelectasis. Small bilateral pleural effusions which may be positional in nature and likely stable from the prior exam. Electronically Signed   By: Inez Catalina M.D.   On: 03/04/2018 07:20    Ct Angio Chest/abd/pel For Dissection W And/or Wo Contrast  Result Date: 02/26/2018 CLINICAL DATA:  Chest pressure today EXAM: CT ANGIOGRAPHY CHEST, ABDOMEN AND PELVIS TECHNIQUE: Multidetector CT  imaging through the chest, abdomen and pelvis was performed using the standard protocol during bolus administration of intravenous contrast. Multiplanar reconstructed images and MIPs were obtained and reviewed to evaluate the vascular anatomy. CONTRAST:  171mL ISOVUE-370 IOPAMIDOL (ISOVUE-370) INJECTION 76% COMPARISON:  05/18/2017 chest CT without contrast FINDINGS: CTA CHEST FINDINGS Cardiovascular: There is no evidence of aortic dissection or acute intramural hematoma. Maximal diameter of the ascending aorta is 3.7 cm. Atherosclerotic calcifications of the aortic arch and great vessels are noted. Moderate 3 vessel coronary artery calcification. Great vessels are patent. Vertebral arteries are patent there is no obvious evidence of acute pulmonary thromboembolism. Mediastinum/Nodes: Visualized thyroid is unremarkable. No abnormal mediastinal adenopathy. Esophagus is within normal limits. Lungs/Pleura: No pneumothorax. No pleural effusion. Dependent atelectasis in the lungs. Musculoskeletal: No definite acute vertebral compression deformity. Scoliosis at the thoracolumbar junction is not significantly changed. This is associated with advanced degenerative disc disease. Review of the MIP images confirms the above findings. CTA ABDOMEN AND PELVIS FINDINGS VASCULAR Aorta: No evidence of aortic dissection or aneurysm. Scattered atherosclerotic calcifications are noted. Celiac: Patent. Branch vessels patent. Accessory left hepatic artery anatomy. SMA: Atherosclerotic calcifications are present at the origin. No significant focal narrowing. Replaced right hepatic artery. Renals: Single renal arteries are patent. IMA: Origin is patent. There is significant narrowing 3 cm beyond its takeoff. Branch vessels are grossly patent. Inflow: There are atherosclerotic calcifications throughout the iliac arterial system but no significant narrowing in the common, internal, or external iliac arteries. Left common iliac artery is  ectatic with a maximal caliber of 11 mm. Review of the MIP images confirms the above findings. NON-VASCULAR Hepatobiliary: Unremarkable Pancreas: Unremarkable Spleen: Unremarkable Adrenals/Urinary Tract: Adrenal glands are grossly within normal limits but there are poorly visualized. There is a simple cyst in the lower pole of the left kidney. Other hypodensities are too small to characterize. There is scarring in the left kidney. Right kidney is within normal limits. Bladder is decompressed. Stomach/Bowel: Sigmoid diverticulosis is present. There is no convincing evidence of acute diverticulitis. There is no obvious mass in the colon. No evidence of small-bowel obstruction. There is fluid and high-density material layering in the stomach. It is grossly within normal limits. Lymphatic: No abnormal retroperitoneal adenopathy. Reproductive: Uterus and adnexa are within normal limits. Other: No free fluid. Musculoskeletal: Thoracolumbar scoliosis. No vertebral compression deformity. Review of the MIP images confirms the above findings. IMPRESSION: Vascular: No evidence of aortic dissection.  No acute vascular pathology. There is narrowing in the IMA, 3 cm beyond its takeoff. Celiac and SMA are patent. Nonvascular: No acute process in the chest, abdomen, or pelvis. Aortic Atherosclerosis (ICD10-I70.0). Electronically Signed   By: Marybelle Killings M.D.   On: 02/26/2018 09:28    Discharge Instructions    AMB Referral to Cardiac Rehabilitation - Phase II   Complete by:  As directed    Diagnosis:  CABG   CABG X ___:  3   Amb Referral to  Cardiac Rehabilitation   Complete by:  As directed    Diagnosis:  CABG   CABG X ___:  3      Discharge Medications: Allergies as of 03/08/2018   No Known Allergies     Medication List    STOP taking these medications   naproxen sodium 220 MG tablet Commonly known as:  ALEVE     TAKE these medications   aspirin 325 MG EC tablet Take 1 tablet (325 mg total) by mouth  daily. Start taking on:  03/09/2018   CALCIUM+D3 PO Take 1 tablet by mouth 2 (two) times daily after a meal.   lisinopril 2.5 MG tablet Commonly known as:  PRINIVIL,ZESTRIL Take 1 tablet (2.5 mg total) by mouth daily. Start taking on:  03/09/2018   methimazole 5 MG tablet Commonly known as:  TAPAZOLE Take 5 mg by mouth See admin instructions. Take 5 mg by mouth at bedtime on Mon/Tues/Wed/Thurs/Fri/Sat   metoprolol tartrate 25 MG tablet Commonly known as:  LOPRESSOR Take 1 tablet (25 mg total) by mouth 2 (two) times daily.   ONE-A-DAY WOMENS FORMULA Tabs Take 1 tablet by mouth daily.   pravastatin 40 MG tablet Commonly known as:  PRAVACHOL Take 40 mg by mouth at bedtime.   traMADol 50 MG tablet Commonly known as:  ULTRAM Take 1 tablet (50 mg total) by mouth every 6 (six) hours as needed for moderate pain.   Vitamin D-3 1000 units Caps Take 1,000 Units by mouth at bedtime.            Durable Medical Equipment  (From admission, onward)        Start     Ordered   03/08/18 0843  For home use only DME 3 n 1  Once     03/08/18 5956    The patient has been discharged on:   1.Beta Blocker:  Yes [  x ]                              No   [   ]                              If No, reason:  2.Ace Inhibitor/ARB: Yes [  x ]                                     No  [    ]                                     If No, reason:  3.Statin:   Yes [  x ]                  No  [   ]                  If No, reason:  4.Ecasa:  Yes  [ x  ]                  No   [   ]                  If No, reason:  Follow Up Appointments: Follow-up Information  Grace Isaac, MD. Go on 04/19/2018.   Specialty:  Cardiothoracic Surgery Why:  PA/LAT CXR to be taken (at [facility] which is in the same building as Dr. Everrett Coombe office) on 04/19/2018 at 12:30 pm;Appointment time is at 1:00 pm Contact information: [place] [address] [phone]         Almyra Deforest, [place]. Go on 03/23/2018.   Specialties:  Cardiology, Radiology Why:  Appointment time is at 11:00 am. Arrive 15 minutes prior to appointment to check in. Isaac Laud is one of the PAs that works closely with Dr. Claiborne Billings. Contact information: [address] [place] [address] [phone]        Marton Redwood, MD. Call in 1 day(s).   Specialty:  Internal Medicine Contact information: [place] [address] [phone]        Belva Crome, MD .   Specialty:  Cardiology Contact information: [phone] [address] [place] [address] [phone]           Signed: Terance Hart [facility] 03/08/2018, 8:43 AM

## 2018-03-03 NOTE — Care Management Note (Signed)
Case Management Note Marvetta Gibbons RN,BSN Unit [facility] 1-22 Case Manager  [phone]  Patient Details  Name: Deborah [name] MRN: [id number] Date of Birth: 25-Apr-1946  Subjective/Objective:   Pt admitted with [place], cath revealed 2VD, pt s/p CABGx3 on 03/02/18                 Action/Plan: PTA pt lived at home with spouse, independent with mobility and ADLs. CM to follow for transition of care needs post op  Expected Discharge Date:                  Expected Discharge Plan:     In-House Referral:     Discharge planning Services  CM Consult  Post Acute Care Choice:    Choice offered to:     DME Arranged:    DME Agency:     HH Arranged:    HH Agency:     Status of Service:  In process, will continue to follow  If discussed at Long Length of Stay Meetings, dates discussed:    Discharge Disposition:   Additional Comments:  Dawayne Patricia, RN 03/03/2018, 10:33 AM

## 2018-03-03 NOTE — Progress Notes (Signed)
Patient ID: Deborah [name], female   DOB: 1946/02/08, 72 y.o.   MRN: [id number] EVENING ROUNDS NOTE :     [address]       [place] [address]             [phone]                 1 Day Post-Op Procedure(s) (LRB): CORONARY ARTERY BYPASS GRAFTING (CABG) (N/A) TRANSESOPHAGEAL ECHOCARDIOGRAM (TEE) (N/A)  Total Length of Stay:  LOS: 5 days  BP 112/60   Pulse 83   Temp 98 F (36.7 C) (Oral)   Resp (!) 22   Ht 5\' 4"  (1.626 m)   Wt 109 lb 5.6 oz (49.6 kg)   SpO2 96%   BMI 18.77 kg/m   .Intake/Output      07/02 0701 - 07/03 0700 07/03 0701 - 07/04 0700   P.O.     I.V. (mL/kg) 3432.5 (69.2) 213.5 (4.3)   Blood 410    NG/GT 30    IV Piggyback 1368.4    Total Intake(mL/kg) 5240.9 (105.7) 213.5 (4.3)   Urine (mL/kg/hr) 1505 (1.3) 250 (0.4)   Blood 900    Chest Tube 520 130   Total Output 2925 380   Net +2315.9 -166.5          . sodium chloride Stopped (03/03/18 0940)  . sodium chloride Stopped (03/02/18 0509)  . sodium chloride    . sodium chloride 10 mL/hr at 03/02/18 1305  .  ceFAZolin (ANCEF) IV 2 g (03/03/18 1425)  . dexmedetomidine (PRECEDEX) IV infusion Stopped (03/02/18 1501)  . lactated ringers    . lactated ringers Stopped (03/02/18 1431)  . lactated ringers 20 mL/hr at 03/03/18 1200  . nitroGLYCERIN Stopped (03/02/18 1843)  . phenylephrine (NEO-SYNEPHRINE) Adult infusion Stopped (03/03/18 1151)     Lab Results  Component Value Date   WBC 9.5 03/03/2018   HGB 9.2 (L) 03/03/2018   HCT 27.0 (L) 03/03/2018   PLT 95 (L) 03/03/2018   GLUCOSE 136 (H) 03/03/2018   CHOL 161 02/27/2018   TRIG 45 02/27/2018   HDL 64 02/27/2018   LDLCALC 88 02/27/2018   ALT 17 03/01/2018   AST 21 03/01/2018   NA 133 (L) 03/03/2018   K 4.3 03/03/2018   CL 99 03/03/2018   CREATININE 0.50 03/03/2018   BUN 11 03/03/2018   CO2 22 03/03/2018   INR 1.38 03/03/2018   HGBA1C 5.4 03/02/2018   Off drips Stable day   Grace Isaac MD  Beeper  [phone] Office [phone] 03/03/2018 6:50 PM

## 2018-03-04 ENCOUNTER — Inpatient Hospital Stay (HOSPITAL_COMMUNITY): Payer: Medicare Other

## 2018-03-04 LAB — BASIC METABOLIC PANEL
Anion gap: 6 (ref 5–15)
BUN: 11 mg/dL (ref 8–23)
CO2: 24 mmol/L (ref 22–32)
Calcium: 8.1 mg/dL — ABNORMAL LOW (ref 8.9–10.3)
Chloride: 102 mmol/L (ref 98–111)
Creatinine, Ser: 0.59 mg/dL (ref 0.44–1.00)
GFR calc Af Amer: >60 mL/min (ref >60)
GFR calc non Af Amer: >60 mL/min (ref >60)
Glucose, Bld: 102 mg/dL — ABNORMAL HIGH (ref 70–99)
Potassium: 4.1 mmol/L (ref 3.5–5.1)
Sodium: 132 mmol/L — ABNORMAL LOW (ref 135–145)

## 2018-03-04 LAB — CBC
HCT: 27.2 % — ABNORMAL LOW (ref 36.0–46.0)
Hemoglobin: 8.9 g/dL — ABNORMAL LOW (ref 12.0–15.0)
MCH: 32.8 pg (ref 26.0–34.0)
MCHC: 32.7 g/dL (ref 30.0–36.0)
MCV: 100.4 fL — ABNORMAL HIGH (ref 78.0–100.0)
Platelets: 107 10*3/uL — ABNORMAL LOW (ref 150–400)
RBC: 2.71 MIL/uL — ABNORMAL LOW (ref 3.87–5.11)
RDW: 12.9 % (ref 11.5–15.5)
WBC: 11.9 10*3/uL — ABNORMAL HIGH (ref 4.0–10.5)

## 2018-03-04 LAB — GLUCOSE, CAPILLARY
GLUCOSE-CAPILLARY: 105 mg/dL — AB (ref 70–99)
GLUCOSE-CAPILLARY: 94 mg/dL (ref 70–99)
GLUCOSE-CAPILLARY: 95 mg/dL (ref 70–99)
Glucose-Capillary: 87 mg/dL (ref 70–99)

## 2018-03-04 MED ORDER — ONDANSETRON HCL 4 MG/2ML IJ SOLN: 4.0000 mg | Freq: Four times a day (QID) | INTRAMUSCULAR | Status: DC | PRN

## 2018-03-04 MED ORDER — SODIUM CHLORIDE 0.9 % IV SOLN: 250.0000 mL | INTRAVENOUS | Status: DC | PRN

## 2018-03-04 MED ORDER — TRAMADOL HCL 50 MG PO TABS
50.0000 mg | ORAL_TABLET | ORAL | Status: DC | PRN
  Administered 2018-03-05 – 2018-03-07 (×6): 50 mg via ORAL
  Filled 2018-03-04 (×6): qty 1

## 2018-03-04 MED ORDER — SODIUM CHLORIDE 0.9% FLUSH
3.0000 mL | Freq: Two times a day (BID) | INTRAVENOUS | Status: DC
  Administered 2018-03-04 – 2018-03-07 (×6): 3 mL via INTRAVENOUS

## 2018-03-04 MED ORDER — MOVING RIGHT ALONG BOOK
Freq: Once | Status: DC
  Filled 2018-03-04: qty 1

## 2018-03-04 MED ORDER — SODIUM CHLORIDE 0.9% FLUSH: 3.0000 mL | INTRAVENOUS | Status: DC | PRN

## 2018-03-04 MED ORDER — BISACODYL 5 MG PO TBEC: 10.0000 mg | DELAYED_RELEASE_TABLET | Freq: Every day | ORAL | Status: DC | PRN

## 2018-03-04 MED ORDER — OXYCODONE HCL 5 MG PO TABS: 5.0000 mg | ORAL_TABLET | ORAL | Status: DC | PRN

## 2018-03-04 MED ORDER — BISACODYL 10 MG RE SUPP: 10.0000 mg | Freq: Every day | RECTAL | Status: DC | PRN

## 2018-03-04 MED ORDER — POTASSIUM CHLORIDE CRYS ER 20 MEQ PO TBCR
40.0000 meq | EXTENDED_RELEASE_TABLET | Freq: Once | ORAL | Status: AC
  Administered 2018-03-04: 40 meq via ORAL
  Filled 2018-03-04: qty 2

## 2018-03-04 MED ORDER — POTASSIUM CHLORIDE CRYS ER 20 MEQ PO TBCR
20.0000 meq | EXTENDED_RELEASE_TABLET | Freq: Two times a day (BID) | ORAL | Status: AC
  Administered 2018-03-05 – 2018-03-07 (×6): 20 meq via ORAL
  Filled 2018-03-04 (×6): qty 1

## 2018-03-04 MED ORDER — METOPROLOL TARTRATE 12.5 MG HALF TABLET
12.5000 mg | ORAL_TABLET | Freq: Two times a day (BID) | ORAL | Status: DC
  Administered 2018-03-04: 12.5 mg via ORAL
  Filled 2018-03-04: qty 1

## 2018-03-04 MED ORDER — FUROSEMIDE 10 MG/ML IJ SOLN
40.0000 mg | Freq: Once | INTRAMUSCULAR | Status: AC
  Administered 2018-03-04: 40 mg via INTRAVENOUS
  Filled 2018-03-04: qty 4

## 2018-03-04 MED ORDER — DOCUSATE SODIUM 100 MG PO CAPS: 200.0000 mg | ORAL_CAPSULE | Freq: Every day | ORAL | Status: DC

## 2018-03-04 MED ORDER — ONDANSETRON HCL 4 MG PO TABS: 4.0000 mg | ORAL_TABLET | Freq: Four times a day (QID) | ORAL | Status: DC | PRN

## 2018-03-04 MED ORDER — TRAMADOL HCL 50 MG PO TABS: 50.0000 mg | ORAL_TABLET | Freq: Four times a day (QID) | ORAL | Status: DC | PRN

## 2018-03-04 MED ORDER — ACETAMINOPHEN 325 MG PO TABS: 650.0000 mg | ORAL_TABLET | Freq: Four times a day (QID) | ORAL | Status: DC | PRN

## 2018-03-04 MED ORDER — FUROSEMIDE 40 MG PO TABS
40.0000 mg | ORAL_TABLET | Freq: Every day | ORAL | Status: AC
  Administered 2018-03-05 – 2018-03-07 (×3): 40 mg via ORAL
  Filled 2018-03-04 (×3): qty 1

## 2018-03-04 MED ORDER — PANTOPRAZOLE SODIUM 40 MG PO TBEC
40.0000 mg | DELAYED_RELEASE_TABLET | Freq: Every day | ORAL | Status: DC
  Administered 2018-03-06 – 2018-03-08 (×3): 40 mg via ORAL
  Filled 2018-03-04 (×4): qty 1

## 2018-03-04 MED ORDER — ASPIRIN EC 325 MG PO TBEC
325.0000 mg | DELAYED_RELEASE_TABLET | Freq: Every day | ORAL | Status: DC
  Administered 2018-03-05 – 2018-03-08 (×4): 325 mg via ORAL
  Filled 2018-03-04 (×4): qty 1

## 2018-03-04 NOTE — NC FL2 (Signed)
[employer] LEVEL OF CARE SCREENING TOOL     IDENTIFICATION  Patient Name: Deborah [name] Birthdate: 1946-04-26 Sex: female Admission Date (Current Location): 02/26/2018  [facility] and [place] Number:  Herbalist and Address:  [facility] [place] [address] [place], [place] [address]      Provider Number: [id number]  Attending Physician Name and Address:  Leonie Man, MD  Relative Name and Phone Number:       Current Level of Care: SNF Recommended Level of Care: [place] [facility] Prior Approval Number:    Date Approved/Denied:   PASRR Number: [id number]  Discharge Plan: SNF    Current Diagnoses: Patient Active Problem List   Diagnosis Date Noted  . S/P CABG x 3 03/03/2018  . Coronary artery disease involving native heart without angina pectoris   . Unstable angina ([place]) 02/26/2018  . Hyperlipemia 02/26/2018  . NSTEMI (non-ST elevated myocardial infarction) ([place]) 02/26/2018    Orientation RESPIRATION BLADDER Height & Weight     Self, Time, Situation, Place  Normal Incontinent, Indwelling catheter Weight: 111 lb 12.8 oz (50.7 kg) Height:  5\' 4"  (162.6 cm)  BEHAVIORAL SYMPTOMS/MOOD NEUROLOGICAL BOWEL NUTRITION STATUS      Continent Diet(cardiac)  AMBULATORY STATUS COMMUNICATION OF NEEDS Skin   Limited Assist Verbally Surgical wounds(chest incision silver hydrofiber dressing; right and left leg incisions with gauze dressing)                       Personal Care Assistance Level of Assistance  Bathing, Dressing Bathing Assistance: Maximum assistance   Dressing Assistance: Maximum assistance     Functional Limitations Info             SPECIAL CARE FACTORS FREQUENCY  PT (By licensed PT), OT (By licensed OT)     PT Frequency: 5/wk OT Frequency: 5/wk            Contractures      Additional Factors Info  Code Status, Allergies Code Status Info: FULL Allergies Info: NKA            Current Medications (03/04/2018):  This is the current hospital active medication list Current Facility-Administered Medications  Medication Dose Route Frequency Provider Last Rate Last Dose  . 0.45 % sodium chloride infusion   Intravenous Continuous PRN Grace Isaac, MD   Stopped at 03/03/18 0940  . 0.9 %  sodium chloride infusion (Manually program via Guardrails IV Fluids)   Intravenous Once Inda Coke, CRNA   Stopped at 03/02/18 1431  . 0.9 %  sodium chloride infusion  250 mL Intravenous PRN Leonie Man, MD   Stopped at 03/02/18 [phone]  . 0.9 %  sodium chloride infusion  250 mL Intravenous Continuous Grace Isaac, MD      . 0.9 %  sodium chloride infusion   Intravenous Continuous Grace Isaac, MD 10 mL/hr at 03/02/18 1305    . acetaminophen (TYLENOL) tablet 1,000 mg  1,000 mg Oral Q6H Grace Isaac, MD   1,000 mg at 03/04/18 0555   Or  . acetaminophen (TYLENOL) solution 1,000 mg  1,000 mg Per Tube Q6H Grace Isaac, MD      . acetaminophen (TYLENOL) tablet 650 mg  650 mg Oral Q4H PRN Daune Perch, NP   650 mg at 02/27/18 0102  . aspirin EC tablet 325 mg  325 mg Oral Daily Grace Isaac, MD  Or  . aspirin chewable tablet 324 mg  324 mg Per Tube Daily Grace Isaac, MD      . bisacodyl (DULCOLAX) EC tablet 10 mg  10 mg Oral Daily Grace Isaac, MD   10 mg at 03/03/18 6811   Or  . bisacodyl (DULCOLAX) suppository 10 mg  10 mg Rectal Daily Grace Isaac, MD      . docusate sodium (COLACE) capsule 200 mg  200 mg Oral Daily Grace Isaac, MD   200 mg at 03/03/18 0951  . enoxaparin (LOVENOX) injection 30 mg  30 mg Subcutaneous QHS Grace Isaac, MD   30 mg at 03/03/18 2256  . furosemide (LASIX) injection 40 mg  40 mg Intravenous Once Gaye Pollack, MD      . methimazole (TAPAZOLE) tablet 5 mg  5 mg Oral Once per day on Mon Tue Wed Thu Fri Sat Daune Perch, NP   5 mg at 03/03/18 2257  . metoprolol tartrate (LOPRESSOR)  tablet 12.5 mg  12.5 mg Oral BID Grace Isaac, MD   12.5 mg at 03/03/18 2300   Or  . metoprolol tartrate (LOPRESSOR) 25 mg/10 mL oral suspension 12.5 mg  12.5 mg Per Tube BID Grace Isaac, MD      . metoprolol tartrate (LOPRESSOR) injection 2.5-5 mg  2.5-5 mg Intravenous Q2H PRN Grace Isaac, MD      . multivitamin with minerals tablet 1 tablet  1 tablet Oral Daily Daune Perch, NP   1 tablet at 03/03/18 0951  . ondansetron (ZOFRAN) injection 4 mg  4 mg Intravenous Q6H PRN Daune Perch, NP      . ondansetron [facility]) injection 4 mg  4 mg Intravenous Q6H PRN Grace Isaac, MD   4 mg at 03/03/18 1632  . oxyCODONE (Oxy IR/ROXICODONE) immediate release tablet 5-10 mg  5-10 mg Oral Q3H PRN Grace Isaac, MD   10 mg at 03/03/18 0353  . pantoprazole (PROTONIX) EC tablet 40 mg  40 mg Oral Daily Lanelle Bal B, MD      . potassium chloride SA (K-DUR,KLOR-CON) CR tablet 40 mEq  40 mEq Oral Once Gaye Pollack, MD      . pravastatin (PRAVACHOL) tablet 40 mg  40 mg Oral QHS Daune Perch, NP   40 mg at 03/03/18 2256  . sodium chloride flush (NS) 0.9 % injection 3 mL  3 mL Intravenous Q12H Leonie Man, MD   3 mL at 03/03/18 2259  . sodium chloride flush (NS) 0.9 % injection 3 mL  3 mL Intravenous PRN Leonie Man, MD      . sodium chloride flush (NS) 0.9 % injection 3 mL  3 mL Intravenous Q12H Grace Isaac, MD   3 mL at 03/03/18 2259  . sodium chloride flush (NS) 0.9 % injection 3 mL  3 mL Intravenous PRN Grace Isaac, MD      . traMADol Veatrice Bourbon) tablet 50 mg  50 mg Oral Q6H PRN Gaye Pollack, MD         Discharge Medications: Please see discharge summary for a list of discharge medications.  Relevant Imaging Results:  Relevant Lab Results:   Additional Information SS#: [id number]  Jorge Ny, LCSW

## 2018-03-04 NOTE — Progress Notes (Signed)
Pt received from [place]. VSS. CHG complete. Telemetry applied. Dinner tray rerouted. Pt oriented to room and unit. Will continue to monitor.  Clyde Canterbury, RN

## 2018-03-04 NOTE — Progress Notes (Signed)
2 Days Post-Op Procedure(s) (LRB): CORONARY ARTERY BYPASS GRAFTING (CABG) (N/A) TRANSESOPHAGEAL ECHOCARDIOGRAM (TEE) (N/A) Subjective:  No complaints Ambulated this am  Objective: Vital signs in last 24 hours: Temp:  [98 F (36.7 C)-99.7 F (37.6 C)] 98.2 F (36.8 C) (07/04 0730) Pulse Rate:  [72-95] 91 (07/04 0800) Cardiac Rhythm: Normal sinus rhythm (07/04 0800) Resp:  [14-29] 24 (07/04 0800) BP: (89-151)/(48-74) 138/73 (07/04 0800) SpO2:  [75 %-100 %] 95 % (07/04 0800) Arterial Line BP: (97-186)/(40-98) 133/47 (07/03 1800) Weight:  [50.7 kg (111 lb 12.8 oz)] 50.7 kg (111 lb 12.8 oz) (07/04 0500)  Hemodynamic parameters for last 24 hours: PAP: (22)/(12) 22/12  Intake/Output from previous day: 07/03 0701 - 07/04 0700 In: 1208.4 [P.O.:360; I.V.:548.4; IV Piggyback:300.1] Out: 1000 [Urine:870; Chest Tube:130] Intake/Output this shift: Total I/O In: 19.9 [I.V.:19.9] Out: -   General appearance: alert and cooperative Neurologic: intact Heart: regular rate and rhythm, S1, S2 normal, no murmur, click, rub or gallop Lungs: diminished breath sounds bibasilar Extremities: edema mild Wound: dressings dry  Lab Results: Recent Labs    03/03/18 1634 03/03/18 1658 03/04/18 0333  WBC 9.5  --  11.9*  HGB 8.9* 9.2* 8.9*  HCT 27.4* 27.0* 27.2*  PLT 95*  --  107*   BMET:  Recent Labs    03/03/18 0331  03/03/18 1658 03/04/18 0333  NA 136  --  133* 132*  K 4.1  --  4.3 4.1  CL 108  --  99 102  CO2 22  --   --  24  GLUCOSE 117*  --  136* 102*  BUN 9  --  11 11  CREATININE 0.58   < > 0.50 0.59  CALCIUM 7.8*  --   --  8.1*   < > = values in this interval not displayed.    PT/INR:  Recent Labs    03/03/18 0331  LABPROT 16.9*  INR 1.38   ABG    Component Value Date/Time   PHART 7.313 (L) 03/02/2018 1742   HCO3 20.9 03/02/2018 1742   TCO2 22 03/03/2018 1658   ACIDBASEDEF 5.0 (H) 03/02/2018 1742   O2SAT 98.0 03/02/2018 1742   CBG (last 3)  Recent Labs   03/03/18 2354 03/04/18 0427 03/04/18 0733  GLUCAP 94 87 105*   CXR: bibasilar atelectasis and possibly small pleural effusions  Assessment/Plan: S/P Procedure(s) (LRB): CORONARY ARTERY BYPASS GRAFTING (CABG) (N/A) TRANSESOPHAGEAL ECHOCARDIOGRAM (TEE) (N/A)  POD 2 Hemodynamically stable in sinus rhythm. Continue low dose beta blocker  Mild volume excess: start diuresis  Glucose under good control and preop Hgb A1c was normal. Will stop CBG's.  DC central lines and foley  Continue IS, ambulation  Transfer to 4E  Her husband is not in good health and she does not feel that he will be able to help her at home so will get PT/OT/social work eval for SNF.   LOS: 6 days    Deborah [name] 03/04/2018

## 2018-03-04 NOTE — Progress Notes (Signed)
   Doing well today.  Maintaining normal sinus rhythm.  All catheters out except in her Foley.  To have diuresis today.  Probably transfer to floor tomorrow or later today.  EKG reveals nonspecific ST abnormality with resolution of deep T wave inversion.  No specific cardiology recommendations.  Doing well.  We will continue to follow advise as necessary.

## 2018-03-04 NOTE — Plan of Care (Signed)
Report called to [facility] on 4E. Discussed all PMH, current status, and current plan of care. Pt notified family of transfer and new room number via personal cell phone.

## 2018-03-04 NOTE — Evaluation (Signed)
Physical Therapy Evaluation Patient Details Name: CHELSY PARRALES MRN: [id number] DOB: 11/03/1945 Today's Date: 03/04/2018   History of Present Illness  Pt is a 72 y.o. female admitted 02/26/18, now s/p CABG on 7/2. PMH includes CA, arthritis, bilateral rotator cuff repairs.  Clinical Impression  Pt presents with an overall decrease in functional mobility secondary to above. PTA, pt indep and lives with husband; pt remains very active and typically performs all household tasks. Pt with good awareness of sternal precautions ("move in the tube") requiring minimal cues on maintaining these with mobility. Amb 500' with no UE support and supervision for safety; no overt instability with higher level balance tasks. Pt motivated to participate in outpatient cardiac rehab, but will need assist for transportation. Pt would benefit from continued acute PT services to maximize functional mobility and independence prior to d/c home.     Follow Up Recommendations No PT follow up;Supervision - Intermittent(outpatient cardiac rehab)    Equipment Recommendations  None recommended by PT    Recommendations for Other Services OT consult     Precautions / Restrictions Precautions Precautions: Sternal Precaution Booklet Issued: Yes (comment) Precaution Comments: Pt able to recall "move in the tube" precautions; verbally reviewed specifics to these Restrictions Weight Bearing Restrictions: Yes(sternal precautions)      Mobility  Bed Mobility               General bed mobility comments: Received sitting in recliner  Transfers Overall transfer level: Needs assistance Equipment used: None Transfers: Sit to/from Stand Sit to Stand: Supervision         General transfer comment: Stood with hands on knees, no cues required; supervision for balance  Ambulation/Gait Ambulation/Gait assistance: Supervision Gait Distance (Feet): 500 Feet Assistive device: None Gait Pattern/deviations: Step-through  pattern;Decreased stride length Gait velocity: Decreased Gait velocity interpretation: 1.31 - 2.62 ft/sec, indicative of limited community ambulator General Gait Details: Slow, steady amb with no DME; initial min guard for balance, progressing to supervision. DOE 2/4 post-ambulation, but pt conversing throughout  [employer] Stairs: Yes Stairs assistance: Supervision Stair Management: One rail Left   General stair comments: Simulated ascending steps by high marching with double/single UE support on hallway rail; supervision for safetyu  Wheelchair Mobility    Modified Rankin (Stroke Patients Only)       Balance Overall balance assessment: Needs assistance   Sitting balance-Leahy Scale: Good       Standing balance-Leahy Scale: Good               High level balance activites: Side stepping;Backward walking;Direction changes;Turns;Head turns High Level Balance Comments: No overt instability or LOB noted             Pertinent Vitals/Pain Pain Assessment: Faces Faces Pain Scale: Hurts a little bit Pain Location: Sternal incision Pain Descriptors / Indicators: Tightness Pain Intervention(s): Monitored during session    Home Living Family/patient expects to be discharged to:: Private residence Living Arrangements: Spouse/significant other Available Help at Discharge: Family;Available 24 hours/day Type of Home: House Home Access: Stairs to enter Entrance Stairs-Rails: Psychiatric nurse of Steps: 6 Home Layout: Two level Home Equipment: Crutches Additional Comments: Will be moving with husband to apartment at [place] independent living at end of July    Prior Function Level of Independence: Independent         Comments: Stays active, drives. Enjoys gardening and housework     Hand Dominance        Extremity/Trunk Assessment   Upper Extremity Assessment  Upper Extremity Assessment: Overall WFL for tasks assessed(within sternal  precautions; h/o multiple bilat rotator cuff repairs)    Lower Extremity Assessment Lower Extremity Assessment: Overall WFL for tasks assessed       Communication   Communication: No difficulties  Cognition Arousal/Alertness: Awake/alert Behavior During Therapy: WFL for tasks assessed/performed Overall Cognitive Status: Within Functional Limits for tasks assessed                                        General Comments General comments (skin integrity, edema, etc.): Husband present during session    Exercises     Assessment/Plan    PT Assessment Patient needs continued PT services  PT Problem List Decreased activity tolerance;Decreased balance;Decreased mobility;Decreased knowledge of precautions       PT Treatment Interventions DME instruction;Gait training;Stair training;Functional mobility training;Therapeutic activities;Therapeutic exercise;Balance training    PT Goals (Current goals can be found in the Care Plan section)  Acute Rehab PT Goals Patient Stated Goal: Feel confident and strong enough to return home PT Goal Formulation: With patient Time For Goal Achievement: 03/18/18 Potential to Achieve Goals: Good    Frequency Min 3X/week   Barriers to discharge        Co-evaluation               AM-PAC PT "6 Clicks" Daily Activity  Outcome Measure Difficulty turning over in bed (including adjusting bedclothes, sheets and blankets)?: Unable Difficulty moving from lying on back to sitting on the side of the bed? : Unable Difficulty sitting down on and standing up from a chair with arms (e.g., wheelchair, bedside commode, etc,.)?: None Help needed moving to and from a bed to chair (including a wheelchair)?: None Help needed walking in hospital room?: None Help needed climbing 3-5 steps with a railing? : A Little 6 Click Score: 17    End of Session Equipment Utilized During Treatment: Gait belt Activity Tolerance: Patient tolerated  treatment well Patient left: in chair;with call bell/phone within reach;with nursing/sitter in room;with family/visitor present Nurse Communication: Mobility status PT Visit Diagnosis: Other abnormalities of gait and mobility (R26.89)    Time: 1173-5670 PT Time Calculation (min) (ACUTE ONLY): 29 min   Charges:   PT Evaluation $PT Eval Moderate Complexity: 1 Mod PT Treatments $Gait Training: 8-22 mins   PT G Codes:       Mabeline Caras, PT, DPT Acute Rehab Services  Pager: [place] 03/04/2018, 10:53 AM

## 2018-03-05 LAB — BPAM RBC
Blood Product Expiration Date: 201907232359
Blood Product Expiration Date: 201907252359
ISSUE DATE / TIME: 201907020816
ISSUE DATE / TIME: 201907020816
Unit Type and Rh: 7300
Unit Type and Rh: 7300

## 2018-03-05 LAB — TYPE AND SCREEN
ABO/RH(D): B POS
Antibody Screen: NEGATIVE
Unit division: 0
Unit division: 0

## 2018-03-05 MED ORDER — METOPROLOL TARTRATE 25 MG PO TABS
25.0000 mg | ORAL_TABLET | Freq: Two times a day (BID) | ORAL | Status: DC
  Administered 2018-03-05 – 2018-03-08 (×7): 25 mg via ORAL
  Filled 2018-03-05 (×7): qty 1

## 2018-03-05 NOTE — Evaluation (Signed)
Occupational Therapy Evaluation Patient Details Name: Deborah [name] MRN: [id number] DOB: 02/15/1946 Today's Date: 03/05/2018    History of Present Illness Pt is a 72 y.o. female admitted 02/26/18, now s/p CABG on 7/2. PMH includes CA, arthritis, bilateral rotator cuff repairs.   Clinical Impression   This 72 y/o female presents with the above. At baseline pt is independent with ADLs and functional mobility, was very active. Pt demonstrating room level functional mobility and standing grooming ADLs this session with minguard assist. Demonstrates LB ADLs with minguard-minA. Reviewed sternal precautions and further educated on safety and compensatory strategies for completing ADLs while maintaining precautions with pt verbalizing good understanding. Pt lives with spouse at home, of note pt reporting concerns regarding management at home initially after discharge as she typically manages the home, relayed these concerns to [place] as well. Pt will benefit from continued OT services to maximize her safety and independence with ADLs and mobility after return home. Will follow.     Follow Up Recommendations  Supervision/Assistance - 24 hour;Other (comment)(24 hr initially; outpatient cardiac rehab)    Equipment Recommendations  3 in 1 bedside commode           Precautions / Restrictions Precautions Precautions: Sternal Precaution Booklet Issued: Yes (comment) Precaution Comments: Pt able to recall "move in the tube" precautions; verbally reviewed specifics to these Restrictions Weight Bearing Restrictions: No      Mobility Bed Mobility               General bed mobility comments: OOB upon arrival   Transfers Overall transfer level: Needs assistance Equipment used: None Transfers: Sit to/from Stand Sit to Stand: Supervision         General transfer comment: pt verbalizing understanding of standing without use of UE support if able or use of hands on knees; supervision for balance  and safety this session    Balance Overall balance assessment: Needs assistance   Sitting balance-Leahy Scale: Good     Standing balance support: During functional activity;No upper extremity supported Standing balance-Leahy Scale: Good                             ADL either performed or assessed with clinical judgement   ADL Overall ADL's : Needs assistance/impaired Eating/Feeding: Independent;Sitting   Grooming: Min guard;Standing;Oral care   Upper Body Bathing: Min guard;Sitting   Lower Body Bathing: Minimal assistance;Sit to/from stand   Upper Body Dressing : Min guard;Sitting   Lower Body Dressing: Minimal assistance;Sit to/from stand Lower Body Dressing Details (indicate cue type and reason): pt able to bring LEs up to her to reach feet for LB ADLs Toilet Transfer: Min guard;Ambulation;Regular Glass blower/designer Details (indicate cue type and reason): simulated in transfer to recliner  Toileting- Clothing Manipulation and Hygiene: Min guard;Sit to/from stand Toileting - Clothing Manipulation Details (indicate cue type and reason): educated on safety/maintaining precautions during peri-care      Functional mobility during ADLs: Min guard General ADL Comments: reviewed and further educated pt on sternal precautions, safety and compensatory strategies for completing ADLs while maintaining precautions; pt brushing teeth standing at sink upon entering room with supervision of NT, ambulating within room without AD and overall minguard assist. Pt expressing concerns to therapist regarding management at home after discharge as she reports spouse is not able to assist other than driving, passed along concerns to [place] as well  Pertinent Vitals/Pain Pain Assessment: Faces Faces Pain Scale: Hurts a little bit Pain Location: Sternal incision Pain Descriptors / Indicators: Tightness Pain Intervention(s): Monitored during session      Hand Dominance     Extremity/Trunk Assessment Upper Extremity Assessment Upper Extremity Assessment: Difficult to assess due to impaired cognition(within sternal precaution limits; h/o rotator cuff repair )   Lower Extremity Assessment Lower Extremity Assessment: Defer to PT evaluation       Communication Communication Communication: No difficulties   Cognition Arousal/Alertness: Awake/alert Behavior During Therapy: WFL for tasks assessed/performed Overall Cognitive Status: Within Functional Limits for tasks assessed                                                      Home Living Family/patient expects to be discharged to:: Private residence Living Arrangements: Spouse/significant other Available Help at Discharge: Family;Available 24 hours/day Type of Home: House Home Access: Stairs to enter [employer] of Steps: 6 Entrance Stairs-Rails: Right;Left Home Layout: Two level Alternate Level Stairs-Number of Steps: 6 Alternate Level Stairs-Rails: Right;Left Bathroom Shower/Tub: Occupational psychologist: Standard     Home Equipment: Crutches   Additional Comments: Will be moving with husband to apartment at [place] independent living at end of July      Prior Functioning/Environment Level of Independence: Independent        Comments: Stays active, drives. Enjoys gardening and housework; pt with concerns regarding returning home with spouse, reports spouse drives but she typically completes all other iADLs, reports he is not able to assist with ADLs         OT Problem List: Decreased strength;Impaired balance (sitting and/or standing);Decreased range of motion;Decreased activity tolerance;Cardiopulmonary status limiting activity      OT Treatment/Interventions: Self-care/ADL training;DME and/or AE instruction;Therapeutic activities;Balance training;Therapeutic exercise;Patient/family education    OT Goals(Current  goals can be found in the care plan section) Acute Rehab OT Goals Patient Stated Goal: Feel confident and strong enough to return home OT Goal Formulation: With patient Time For Goal Achievement: 03/19/18 Potential to Achieve Goals: Good  OT Frequency: Min 2X/week    AM-PAC PT "6 Clicks" Daily Activity     Outcome Measure Help from another person eating meals?: None Help from another person taking care of personal grooming?: A Little Help from another person toileting, which includes using toliet, bedpan, or urinal?: A Little Help from another person bathing (including washing, rinsing, drying)?: A Little Help from another person to put on and taking off regular upper body clothing?: A Little Help from another person to put on and taking off regular lower body clothing?: A Little 6 Click Score: 19   End of Session Nurse Communication: Mobility status  Activity Tolerance: Patient tolerated treatment well Patient left: in chair;with call bell/phone within reach;with family/visitor present  OT Visit Diagnosis: Muscle weakness (generalized) (M62.81)                Time: 4268-3419 OT Time Calculation (min): 19 min Charges:  OT General Charges $OT Visit: 1 Visit OT Evaluation $OT Eval Moderate Complexity: 1 Mod G-Codes:     Lou Cal, OT Pager [phone] 03/05/2018  Raymondo Band 03/05/2018, 1:57 PM

## 2018-03-05 NOTE — Progress Notes (Signed)
Pt ambulating hall independently 

## 2018-03-05 NOTE — Progress Notes (Addendum)
CARDIOLOGY RECOMMENDATIONS:  The patient has been seen in conjunction with Melina Copa, PA-C. All aspects of care have been considered and discussed. The patient has been personally interviewed, examined, and all clinical data has been reviewed.   Plavix is not needed  Discharge is anticipated in the next 48 hours - per APP note, likely ready for discharge Sunday. Pt requesting either HH or SNF per notes. Neurologically in tact. Walked well with cardiac rehab.  Recommendations for medications and follow up:  Discharge Medications: Continue medications as they are currently listed in the [facility]. Exceptions to the above:  Beta blocker being titrated for sinus tach.  Not clear to me if we are calling this unstable angina or NSTEMI - one troponin on admission 0.05. If Dr. Tamala Julian feels represented NSTEMI may need to consider addition of Plavix at some point, but will review with him.  Pt is on pravastatin with LDL 88 but reports intolerance to multiple statins but not clear which one. When patient follows up as OP we can consider referral to lipid clinic to review [facility] records and possible indications for PCSK9 inhibitor  Follow Up: The patient's Primary Cardiologist is [name], MD -> originally listed as Dr. Martinique from admission (new), but patient states she had conversation with Dr. Tamala Julian for him to be her cardiologist and he agreed. Have changed in Epic. Dr. Tamala Julian, please inform me if this is not correct.  Follow up in the office is scheduled for 03/23/18 with Almyra Deforest PA-C  Signed,  Charlie Pitter, PA-C  10:32 AM 03/05/2018  [facility]

## 2018-03-05 NOTE — Care Management Note (Signed)
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 CM to follow for transition of care needs post op  Expected Discharge Date:                  Expected Discharge Plan:  Home/Self Care  In-House Referral:  NA  Discharge planning Services  CM Consult, Other - See comment  Post Acute Care Choice:    Choice offered to:     DME Arranged:    DME Agency:     HH Arranged:    HH Agency:     Status of Service:  In process, will continue to follow  If discussed at Long Length of Stay Meetings, dates discussed:    Discharge Disposition:   Additional Comments:  03/05/18- 1500- Marvetta Gibbons RN, CM- per cardiac rehab pt requested to speak to CM regarding transition needs ?hh vs private duty needs. In to speak with pt at bedside- discussed pt's home situation- home with spouse who has gone through chemo tx however still drives but gets tired easily. They are planning on moving at the end of July and have started packing up home prior to pt ending up in the hospital and having surgery. Pt has sister who lives in [place]. But is out of the country until around July 24. Pt does state she has friends that they can call if they need thing from the store or need assistance but she is interested in having someone come assist several hours/day for household needs.- explained difference between [facility] under Medicare and private duty assistance for housework needs. Pt states she does not need any DME for discharge and requesting assistance with finding info on private duty- assisted pt with google search at her request and printed info for her. CM will continue to follow for any further transition of care needs.    Dawayne Patricia, RN 03/05/2018, 3:31 PM

## 2018-03-05 NOTE — Progress Notes (Signed)
EPW's removed per order and unit protocol.  Pt tolerated very well, all tips intact.  Atrial site with scant bleeding, stopped with 2 mins of pressure.  Pt understands bedrest for 1 hr with frequent VS checks.  VSS, see flowsheet.  CCMD notified, will monitor closely.

## 2018-03-05 NOTE — Progress Notes (Signed)
      [address]       [place] [address]             [phone]        3 Days Post-Op Procedure(s) (LRB): CORONARY ARTERY BYPASS GRAFTING (CABG) (N/A) TRANSESOPHAGEAL ECHOCARDIOGRAM (TEE) (N/A)  Subjective: Patient with loose stools and sternal incisional pain this am.  Objective: Vital signs in last 24 hours: Temp:  [98.2 F (36.8 C)-99.9 F (37.7 C)] 99.8 F (37.7 C) (07/05 0500) Pulse Rate:  [91-110] 100 (07/05 0500) Cardiac Rhythm: Sinus tachycardia (07/04 2000) Resp:  [20-28] 20 (07/04 1602) BP: (101-149)/(55-98) 149/88 (07/05 0500) SpO2:  [90 %-96 %] 95 % (07/05 0500)  Pre op weight 44.2 kg Current Weight  03/04/18 111 lb 12.8 oz (50.7 kg)       Intake/Output from previous day: 07/04 0701 - 07/05 0700 In: 139.9 [P.O.:120; I.V.:19.9] Out: 2200 [Urine:2200]   Physical Exam:  Cardiovascular: Slightly tachycardic Pulmonary: Slightly diminished at bases Abdomen: Soft, non tender, bowel sounds present. Extremities: Mild bilateral lower extremity edema. Wounds: Aquacel removed and wound is clean and dry.  No erythema or signs of infection.  Lab Results: CBC: Recent Labs    03/03/18 1634 03/03/18 1658 03/04/18 0333  WBC 9.5  --  11.9*  HGB 8.9* 9.2* 8.9*  HCT 27.4* 27.0* 27.2*  PLT 95*  --  107*   BMET:  Recent Labs    03/03/18 0331  03/03/18 1658 03/04/18 0333  NA 136  --  133* 132*  K 4.1  --  4.3 4.1  CL 108  --  99 102  CO2 22  --   --  24  GLUCOSE 117*  --  136* 102*  BUN 9  --  11 11  CREATININE 0.58   < > 0.50 0.59  CALCIUM 7.8*  --   --  8.1*   < > = values in this interval not displayed.    PT/INR:  Lab Results  Component Value Date   INR 1.38 03/03/2018   INR 1.59 03/02/2018   INR 1.13 03/01/2018   ABG:  INR: Will add last result for INR, ABG once components are confirmed Will add last 4 CBG results once components are confirmed  Assessment/Plan:  1. CV - S/p NSTEMI. ST in the low 100's this am.On  Lopressor 12.5 mg bid-will increase to 25 mg bid. 2.  Pulmonary - On room air. Encourage incentive spirometer.  3. Volume Overload - On Lasix 40 mg daily 4.  Acute blood loss anemia - H and H yesterday stable at 8.9 and 27.2 5. Thrombocytopenia-platelets 107,000 6. Remove EPW 7. Stop stool softeners 8. ? SNF. Patient requesting [place], possible aide. Likely ready for discharge Sunday   [facility] 03/05/2018,7:29 AM [phone]

## 2018-03-05 NOTE — Progress Notes (Signed)
CARDIAC REHAB PHASE I   PRE:  Rate/Rhythm: 101 ST  BP:  Supine:   Sitting: 143/74  Standing:    SaO2: 92%RA  MODE:  Ambulation: 470 ft   POST:  Rate/Rhythm: 118 ST  BP:  Supine:   Sitting: 147/71  Standing:    SaO2: 94%RA 0840-0920 Pt walked 470 ft on RA with hand held asst. Tolerated well. Still very sore today. Stated she has arthritis. To recliner after walk with call bell. Education completed with pt who voiced understanding. Wrote down how to view discharge video. Reviewed sternal precautions, IS, ex ed and heart healthy diet given. Discussed sternal incision care. Referred to [place] CRP 2.   Graylon Good, RN BSN  03/05/2018 9:18 AM

## 2018-03-06 NOTE — Progress Notes (Signed)
CARDIAC REHAB PHASE I   PRE:  Rate/Rhythm: 95 SR  BP:  Sitting: 108/72      SaO2: 93 RA  MODE:  Ambulation: 400 ft 107 peak HR  POST:  Rate/Rhythm: 92 SR  BP:  Sitting: 125/66    SaO2: 94 RA   Pt ambulated 439ft in hallway independently with steady gait. No c/o CP or SOB. Encouraged pt to continue IS use, pt demonstrated 1000. Pt also excited for CRP II, will send referral to [place]. Reeducated pt on showering and monitoring incisions daily. Will continue to follow.  [id number] Rufina Falco, RN BSN 03/06/2018 1:24 PM

## 2018-03-06 NOTE — Progress Notes (Addendum)
      [address]       [place] [address]             [phone]        4 Days Post-Op Procedure(s) (LRB): CORONARY ARTERY BYPASS GRAFTING (CABG) (N/A) TRANSESOPHAGEAL ECHOCARDIOGRAM (TEE) (N/A)  Subjective: Patient states no further loose stools. She also states Tramadol helps with sternal incisional pain.  Objective: Vital signs in last 24 hours: Temp:  [98.6 F (37 C)-99.1 F (37.3 C)] 99.1 F (37.3 C) (07/06 0646) Pulse Rate:  [96-97] 96 (07/05 2152) Cardiac Rhythm: Sinus tachycardia (07/06 0700) Resp:  [19-25] 19 (07/06 0646) BP: (119-132)/(66-77) 129/66 (07/06 0646) SpO2:  [92 %-94 %] 92 % (07/06 0646)  Pre op weight 44.2 kg Current Weight  03/04/18 111 lb 12.8 oz (50.7 kg)       Intake/Output from previous day: 07/05 0701 - 07/06 0700 In: 480 [P.O.:480] Out: -    Physical Exam:  Cardiovascular: RRR Pulmonary: Slightly diminished at bases Abdomen: Soft, non tender, bowel sounds present. Extremities: Mild bilateral lower extremity edema. Wounds: Sternal wound is clean and dry.  No erythema or signs of infection.  Lab Results: CBC: Recent Labs    03/03/18 1634 03/03/18 1658 03/04/18 0333  WBC 9.5  --  11.9*  HGB 8.9* 9.2* 8.9*  HCT 27.4* 27.0* 27.2*  PLT 95*  --  107*   BMET:  Recent Labs    03/03/18 1658 03/04/18 0333  NA 133* 132*  K 4.3 4.1  CL 99 102  CO2  --  24  GLUCOSE 136* 102*  BUN 11 11  CREATININE 0.50 0.59  CALCIUM  --  8.1*    PT/INR:  Lab Results  Component Value Date   INR 1.38 03/03/2018   INR 1.59 03/02/2018   INR 1.13 03/01/2018   ABG:  INR: Will add last result for INR, ABG once components are confirmed Will add last 4 CBG results once components are confirmed  Assessment/Plan:  1. CV - S/p NSTEMI? ST in the low 100's this am.On Lopressor 25 mg bid. Will defer to cardiology if she needs DAPT (if NSTEMI). Monitor BP to see if able to start low dose ACE 2.  Pulmonary - On room air.  Encourage incentive spirometer.  3. Volume Overload - On Lasix 40 mg daily 4.  Acute blood loss anemia - Last H and H stable at 8.9 and 27.2 5. Thrombocytopenia-platelets 107,000 6. As discussed with patient this am, she has arranged for nurse friend to assist her starting Monday. Plan will be to discharge Monday.  Donielle M ZimmermanPA-C 03/06/2018,7:54 AM [phone]   Chart reviewed, patient examined, agree with above. She is progressing well. She says she will not have help at home until Monday so will plan home Monday.

## 2018-03-07 MED ORDER — DOCUSATE SODIUM 100 MG PO CAPS
200.0000 mg | ORAL_CAPSULE | Freq: Every day | ORAL | Status: DC
  Administered 2018-03-08: 200 mg via ORAL
  Filled 2018-03-07: qty 2

## 2018-03-07 MED ORDER — LISINOPRIL 2.5 MG PO TABS
2.5000 mg | ORAL_TABLET | Freq: Every day | ORAL | Status: DC
  Administered 2018-03-07 – 2018-03-08 (×2): 2.5 mg via ORAL
  Filled 2018-03-07 (×2): qty 1

## 2018-03-07 NOTE — Progress Notes (Signed)
Patient ambulated in hallway independently. Anhelica Fowers, Bettina Gavia RN

## 2018-03-07 NOTE — Progress Notes (Signed)
Occupational Therapy Treatment Patient Details Name: Deborah [name] MRN: [id number] DOB: 1946/02/11 Today's Date: 03/07/2018    History of present illness Pt is a 72 y.o. female admitted 02/26/18, now s/p CABG on 7/2. PMH includes CA, arthritis, bilateral rotator cuff repairs.   OT comments  Pt progressing towards established OT. Providing education and handout on energy conservation during ADLs and IADLs. Pt verbalizing understanding and facilitating discussion on EC (within sternal precautions) that pt can implement at home during BADLs and IADLs. Continue to recommend dc home once medically stable and will continue to follow acutely.    Follow Up Recommendations  Supervision/Assistance - 24 hour;Other (comment)(24 hr initially; outpatient cardiac rehab)    Equipment Recommendations  3 in 1 bedside commode    Recommendations for Other Services      Precautions / Restrictions Precautions Precautions: Sternal Precaution Booklet Issued: Yes (comment) Precaution Comments: Pt able to recall "move in the tube" precautions; verbally reviewed specifics to these       Mobility Bed Mobility               General bed mobility comments: OOB upon arrival   Transfers Overall transfer level: Needs assistance Equipment used: None Transfers: Sit to/from Stand Sit to Stand: Supervision         General transfer comment: pt verbalizing understanding of standing without use of UE support if able or use of hands on knees; supervision for balance and safety this session    Balance Overall balance assessment: Needs assistance   Sitting balance-Leahy Scale: Good     Standing balance support: During functional activity;No upper extremity supported Standing balance-Leahy Scale: Good                             ADL either performed or assessed with clinical judgement   ADL Overall ADL's : Needs assistance/impaired               Lower Body Bathing Details (indicate  cue type and reason): Reveiwed compensatory techniques for LB bathing while seated           Toilet Transfer Details (indicate cue type and reason): Reviewed LB dressing techniques by bringing ankles towards knees.         Functional mobility during ADLs: Supervision/safety General ADL Comments: Providing pt with handout and education on energy conseravtion techniques for ADLs and IADLs. Pt verbalizing concern about IADLs at home due to husband's current health. Reviewing EC handout and pt verablizing techniques that she can impliment at home. Pt reporting fatigue with BADLs this morning     Vision       Perception     Praxis      Cognition Arousal/Alertness: Awake/alert Behavior During Therapy: WFL for tasks assessed/performed Overall Cognitive Status: Within Functional Limits for tasks assessed                                          Exercises     Shoulder Instructions       General Comments VSS    Pertinent Vitals/ Pain       Pain Assessment: Faces Faces Pain Scale: Hurts a little bit Pain Location: Sternal incision Pain Descriptors / Indicators: Tightness;Sore Pain Intervention(s): Monitored during session;Repositioned  Home Living  Prior Functioning/Environment              Frequency  Min 2X/week        Progress Toward Goals  OT Goals(current goals can now be found in the care plan section)  Progress towards OT goals: Progressing toward goals  Acute Rehab OT Goals Patient Stated Goal: Feel confident and strong enough to return home OT Goal Formulation: With patient Time For Goal Achievement: 03/19/18 Potential to Achieve Goals: Good ADL Goals Pt Will Perform Grooming: with modified independence;standing Pt Will Perform Lower Body Bathing: with modified independence;sit to/from stand Pt Will Perform Upper Body Dressing: with modified independence;sitting Pt Will  Perform Lower Body Dressing: with modified independence;sit to/from stand Pt Will Transfer to Toilet: with modified independence;ambulating;regular height toilet Pt Will Perform Toileting - Clothing Manipulation and hygiene: with modified independence;sit to/from stand Additional ADL Goal #1: Pt will demonstrate at least 3 energy conservation techniques during ADL task completion. Additional ADL Goal #2: Pt will independently demonstrate sternal precautions during ADL tasks.  Plan Discharge plan remains appropriate    Co-evaluation                 AM-PAC PT "6 Clicks" Daily Activity     Outcome Measure   Help from another person eating meals?: None Help from another person taking care of personal grooming?: A Little Help from another person toileting, which includes using toliet, bedpan, or urinal?: A Little Help from another person bathing (including washing, rinsing, drying)?: A Little Help from another person to put on and taking off regular upper body clothing?: A Little Help from another person to put on and taking off regular lower body clothing?: A Little 6 Click Score: 19    End of Session    OT Visit Diagnosis: Muscle weakness (generalized) (M62.81)   Activity Tolerance Patient tolerated treatment well   Patient Left in chair;with call bell/phone within reach   Nurse Communication Mobility status        Time: 0981-1914 OT Time Calculation (min): 24 min  Charges: OT General Charges $OT Visit: 1 Visit OT Treatments $Self Care/Home Management : 8-22 mins $Therapeutic Activity: 8-22 mins  Aryelle Figg MSOT, OTR/L Acute Rehab Pager: [phone] Office: [place] 03/07/2018, 12:32 PM

## 2018-03-07 NOTE — Progress Notes (Addendum)
      [address]       [place], [address]             [phone]        5 Days Post-Op Procedure(s) (LRB): CORONARY ARTERY BYPASS GRAFTING (CABG) (N/A) TRANSESOPHAGEAL ECHOCARDIOGRAM (TEE) (N/A)  Subjective: Patient  Objective: Vital signs in last 24 hours: Temp:  [98.7 F (37.1 C)-98.8 F (37.1 C)] 98.7 F (37.1 C) (07/07 0552) Pulse Rate:  [84-101] 84 (07/07 0552) Cardiac Rhythm: Normal sinus rhythm (07/07 0730) Resp:  [15-22] 22 (07/07 0552) BP: (131-148)/(67-87) 131/67 (07/07 0552) SpO2:  [93 %-99 %] 93 % (07/07 0552) Weight:  [98 lb 15.8 oz (44.9 kg)] 98 lb 15.8 oz (44.9 kg) (07/07 0552)  Pre op weight 44.2 kg Current Weight  03/07/18 98 lb 15.8 oz (44.9 kg)       Intake/Output from previous day: 07/06 0701 - 07/07 0700 In: 240 [P.O.:240] Out: -    Physical Exam:  Cardiovascular: RRR Pulmonary: Slightly diminished at bases Abdomen: Soft, non tender, bowel sounds present. Extremities: Mild bilateral lower extremity edema. Wounds: Sternal wound is clean and dry.  No erythema or signs of infection.  Lab Results: CBC: No results for input(s): WBC, HGB, HCT, PLT in the last 72 hours. BMET:  No results for input(s): NA, K, CL, CO2, GLUCOSE, BUN, CREATININE, CALCIUM in the last 72 hours.  PT/INR:  Lab Results  Component Value Date   INR 1.38 03/03/2018   INR 1.59 03/02/2018   INR 1.13 03/01/2018   ABG:  INR: Will add last result for INR, ABG once components are confirmed Will add last 4 CBG results once components are confirmed  Assessment/Plan:  1. CV - S/p NSTEMI? ST in the low 100's this am.On Lopressor 25 mg bid. Will start low dose Lisinopril for better BP control. Will defer to cardiology if she needs DAPT (if NSTEMI).  2.  Pulmonary - On room air. Encourage incentive spirometer.  3. Volume Overload - On Lasix 40 mg daily. Will likely not need at discharge. 4.  Acute blood loss anemia - Last H and H stable at 8.9 and  27.2 5. Thrombocytopenia-platelets 107,000 6. Per patient request, [place] in am 7. As discussed with patient , she has arranged for nurse friend to assist her starting Monday. Plan will be to discharge Monday.  Donielle M ZimmermanPA-C 03/07/2018,7:51 AM [phone]    Chart reviewed, patient examined, agree with above. She looks great and should be ready to go home in the am.

## 2018-03-07 NOTE — Progress Notes (Signed)
Exit care documents/patient education given to patient on Metoprolol and Lisinopril tablets. Will monitor patient. Nikolas Casher, Bettina Gavia rN

## 2018-03-08 MED ORDER — ASPIRIN 325 MG PO TBEC: 325.0000 mg | DELAYED_RELEASE_TABLET | Freq: Every day | ORAL | 0 refills | Status: DC

## 2018-03-08 MED ORDER — METOPROLOL TARTRATE 25 MG PO TABS: 25.0000 mg | ORAL_TABLET | Freq: Two times a day (BID) | ORAL | 1 refills | Status: DC

## 2018-03-08 MED ORDER — TRAMADOL HCL 50 MG PO TABS: 50.0000 mg | ORAL_TABLET | Freq: Four times a day (QID) | ORAL | 0 refills | Status: DC | PRN

## 2018-03-08 MED ORDER — LISINOPRIL 2.5 MG PO TABS: 2.5000 mg | ORAL_TABLET | Freq: Every day | ORAL | 1 refills | Status: DC

## 2018-03-08 NOTE — Progress Notes (Signed)
Order received to discharge patient.  Telemetry monitor removed and CCMD notified.  No PIV access present.  Discharge instructions, follow up, medications and instructions for their use discussed with patient. Bedside commode was sent home with patient as ordered.

## 2018-03-08 NOTE — Progress Notes (Signed)
CARDIAC REHAB PHASE I   PRE:  Rate/Rhythm: 94 SR  BP:  Supine:   Sitting: 117/60  Standing:    SaO2: 98%RA  MODE:  Ambulation: 810 ft   POST:  Rate/Rhythm: 95 SR  BP:  Supine:   Sitting: 102/58  Standing:    SaO2: 94%RA 0840-0910 Pt walked 810 ft on RA with hand held asst. Tolerated well. Answered questions pt had re ed. Referred to [place] CRP 2.   Graylon Good, RN BSN  03/08/2018 9:09 AM

## 2018-03-08 NOTE — Progress Notes (Signed)
Occupational Therapy Treatment Patient Details Name: Deborah [name] MRN: [id number] DOB: 1946/06/02 Today's Date: 03/08/2018    History of present illness Pt is a 72 y.o. female admitted 02/26/18, now s/p CABG on 7/2. PMH includes CA, arthritis, bilateral rotator cuff repairs.   OT comments  Pt progressing towards established OT goals. Providing pt with education on tub transfer and use of 3N1. Pt performing simulated tub transfer with supervision for safety. Answering all pt questions in preparation for dc today. Continue to recommend dc home once medically stable per physician and all acute OT needs met. Will sign off.    Follow Up Recommendations  Supervision/Assistance - 24 hour;Other (comment)(24 hr initially; outpatient cardiac rehab)    Equipment Recommendations  3 in 1 bedside commode    Recommendations for Other Services      Precautions / Restrictions Precautions Precautions: Sternal Precaution Comments: Pt able to recall "move in the tube" precautions; verbally reviewed specifics to these Restrictions Weight Bearing Restrictions: Yes       Mobility Bed Mobility               General bed mobility comments: OOB upon arrival   Transfers Overall transfer level: Needs assistance Equipment used: None Transfers: Sit to/from Stand Sit to Stand: Supervision         General transfer comment: Pt demonstrating good hand placement and technique    Balance Overall balance assessment: Needs assistance   Sitting balance-Leahy Scale: Good     Standing balance support: During functional activity;No upper extremity supported Standing balance-Leahy Scale: Good                             ADL either performed or assessed with clinical judgement   ADL Overall ADL's : Needs assistance/impaired               Lower Body Bathing Details (indicate cue type and reason): Reviewing education on LB bathing and compensatory techniques for sternal precaution  adherance.                 Tub/ Shower Transfer: Tub transfer;Supervision/safety;3 in Risk manager Details (indicate cue type and reason): Pt performing simulated tub transfer with supervision for safety. Pt demonstrating good understanding of safe transfer techniques.  Functional mobility during ADLs: Supervision/safety General ADL Comments: Pt continues to demosntrate good progress. Pt performing safe tub transfer with supervision. Reviewing education on [facility] and IADLs.      Vision       Perception     Praxis      Cognition Arousal/Alertness: Awake/alert Behavior During Therapy: WFL for tasks assessed/performed Overall Cognitive Status: Within Functional Limits for tasks assessed                                          Exercises     Shoulder Instructions       General Comments VSS    Pertinent Vitals/ Pain       Pain Assessment: Faces Faces Pain Scale: Hurts a little bit Pain Location: Sternal incision Pain Descriptors / Indicators: Tightness;Sore Pain Intervention(s): Monitored during session;Repositioned  Home Living  Prior Functioning/Environment              Frequency  Min 2X/week        Progress Toward Goals  OT Goals(current goals can now be found in the care plan section)  Progress towards OT goals: Progressing toward goals  Acute Rehab OT Goals Patient Stated Goal: Feel confident and strong enough to return home OT Goal Formulation: With patient Time For Goal Achievement: 03/19/18 Potential to Achieve Goals: Good ADL Goals Pt Will Perform Grooming: with modified independence;standing Pt Will Perform Lower Body Bathing: with modified independence;sit to/from stand Pt Will Perform Upper Body Dressing: with modified independence;sitting Pt Will Perform Lower Body Dressing: with modified independence;sit to/from stand Pt Will Transfer to  Toilet: with modified independence;ambulating;regular height toilet Pt Will Perform Toileting - Clothing Manipulation and hygiene: with modified independence;sit to/from stand Additional ADL Goal #1: Pt will demonstrate at least 3 energy conservation techniques during ADL task completion. Additional ADL Goal #2: Pt will independently demonstrate sternal precautions during ADL tasks.  Plan Discharge plan remains appropriate    Co-evaluation                 AM-PAC PT "6 Clicks" Daily Activity     Outcome Measure   Help from another person eating meals?: None Help from another person taking care of personal grooming?: A Little Help from another person toileting, which includes using toliet, bedpan, or urinal?: A Little Help from another person bathing (including washing, rinsing, drying)?: A Little Help from another person to put on and taking off regular upper body clothing?: A Little Help from another person to put on and taking off regular lower body clothing?: A Little 6 Click Score: 19    End of Session    OT Visit Diagnosis: Muscle weakness (generalized) (M62.81)   Activity Tolerance Patient tolerated treatment well   Patient Left with call bell/phone within reach(talking with MD)   Nurse Communication Mobility status        Time: 8280-0349 OT Time Calculation (min): 19 min  Charges: OT General Charges $OT Visit: 1 Visit OT Treatments $Self Care/Home Management : 8-22 mins  [place], OTR/L Acute Rehab Pager: [phone] Office: [place] 03/08/2018, 10:51 AM

## 2018-03-08 NOTE — Care Management Important Message (Signed)
Important Message  Patient Details  Name: Deborah [name] MRN: [id number] Date of Birth: Oct 05, 1945   Medicare Important Message Given:  Yes    Joyice Magda P Aledo 03/08/2018, 2:11 PM

## 2018-03-08 NOTE — Progress Notes (Addendum)
      [address]       [place] [address]             [phone]      6 Days Post-Op Procedure(s) (LRB): CORONARY ARTERY BYPASS GRAFTING (CABG) (N/A) TRANSESOPHAGEAL ECHOCARDIOGRAM (TEE) (N/A) Subjective: Feels okay this morning.   Objective: Vital signs in last 24 hours: Temp:  [97.8 F (36.6 C)-99.9 F (37.7 C)] 99.2 F (37.3 C) (07/08 0410) Pulse Rate:  [89-94] 89 (07/08 0410) Cardiac Rhythm: Normal sinus rhythm (07/08 0704) Resp:  [18-24] 24 (07/08 0410) BP: (102-119)/(56-76) 117/76 (07/08 0410) SpO2:  [91 %-94 %] 91 % (07/08 0410) Weight:  [44.2 kg (97 lb 7.1 oz)] 44.2 kg (97 lb 7.1 oz) (07/08 0500)     Intake/Output from previous day: 07/07 0701 - 07/08 0700 In: 660 [P.O.:660] Out: -  Intake/Output this shift: No intake/output data recorded.  General appearance: alert, cooperative and no distress Heart: regular rate and rhythm, S1, S2 normal, no murmur, click, rub or gallop Lungs: clear to auscultation bilaterally Abdomen: soft, non-tender; bowel sounds normal; no masses,  no organomegaly Extremities: extremities normal, atraumatic, no cyanosis or edema Wound: clean and dry  Lab Results: No results for input(s): WBC, HGB, HCT, PLT in the last 72 hours. BMET: No results for input(s): NA, K, CL, CO2, GLUCOSE, BUN, CREATININE, CALCIUM in the last 72 hours.  PT/INR: No results for input(s): LABPROT, INR in the last 72 hours. ABG    Component Value Date/Time   PHART 7.313 (L) 03/02/2018 1742   HCO3 20.9 03/02/2018 1742   TCO2 22 03/03/2018 1658   ACIDBASEDEF 5.0 (H) 03/02/2018 1742   O2SAT 98.0 03/02/2018 1742   CBG (last 3)  No results for input(s): GLUCAP in the last 72 hours.  Assessment/Plan: S/P Procedure(s) (LRB): CORONARY ARTERY BYPASS GRAFTING (CABG) (N/A) TRANSESOPHAGEAL ECHOCARDIOGRAM (TEE) (N/A)   1. CV-NSTEMI vs. Unstable angina. It appears based on cardiology's last note that he will not need Plavix at this time. NSR  in the 90s with PACs. BP well controlled. Tolerating full dose ASA, lisinopril, lopressor, and statin.  2. Pulm-no recent xray. On room air with good oxygen saturation. Continue incentive spirometer.  3. Renal-creatinine 0.59, electrolytes okay. Weight is now at baseline. Will not need diuretics at home.  4. H and H has been stable. Platelets trending up 5. Endo-last glucose readings were 87/105/95, well controlled on current regimen.   Plan: Discharge home today.    LOS: 10 days    Deborah [name] 03/08/2018   Chart reviewed, patient examined, agree with above. She looks good and ambulating well. Plan home today.

## 2018-03-08 NOTE — Progress Notes (Addendum)
Progress Note  Patient Name: Delton See Date of Encounter: 03/08/2018  Primary Cardiologist: [name], MD  Subjective   Feeling well this morning. Planned for discharge today. Very motivated to do well at home.   Inpatient Medications    Scheduled Meds: . aspirin EC  325 mg Oral Daily  . docusate sodium  200 mg Oral Daily  . lisinopril  2.5 mg Oral Daily  . methimazole  5 mg Oral Once per day on Mon Tue Wed Thu Fri Sat  . metoprolol tartrate  25 mg Oral BID  . moving right along book   Does not apply Once  . pantoprazole  40 mg Oral QAC breakfast  . pravastatin  40 mg Oral QHS  . sodium chloride flush  3 mL Intravenous Q12H   Continuous Infusions: . sodium chloride     PRN Meds: sodium chloride, acetaminophen, ondansetron **OR** ondansetron (ZOFRAN) IV, oxyCODONE, sodium chloride flush, traMADol   Vital Signs    Vitals:   03/07/18 1312 03/07/18 2038 03/08/18 0410 03/08/18 0500  BP:  119/65 117/76   Pulse:  94 89   Resp:  18 (!) 24   Temp:  99.9 F (37.7 C) 99.2 F (37.3 C)   TempSrc:  Oral Oral   SpO2: 92% 94% 91%   Weight:    97 lb 7.1 oz (44.2 kg)  Height:        Intake/Output Summary (Last 24 hours) at 03/08/2018 0851 Last data filed at 03/07/2018 2038 Gross per 24 hour  Intake 420 ml  Output -  Net 420 ml   Filed Weights   03/04/18 0500 03/07/18 0552 03/08/18 0500  Weight: 111 lb 12.8 oz (50.7 kg) 98 lb 15.8 oz (44.9 kg) 97 lb 7.1 oz (44.2 kg)    Telemetry    SR - Personally Reviewed  Physical Exam   General: Well developed, well nourished, female appearing in no acute distress. Head: Normocephalic, atraumatic.  Neck: Supple without bruits, JVD. Lungs:  Resp regular and unlabored, CTA. Heart: RRR, S1, S2, no S3, S4, or murmur; no rub. Abdomen: Soft, non-tender, non-distended with normoactive bowel sounds. Extremities: No clubbing, cyanosis, edema. Distal pedal pulses are 2+ bilaterally. Neuro: Alert and oriented X 3. Moves all  extremities spontaneously. Psych: Normal affect.  Labs    Chemistry Recent Labs  Lab 03/02/18 0553  03/03/18 0331 03/03/18 1634 03/03/18 1658 03/04/18 0333  NA 138   < > 136  --  133* 132*  K 3.7   < > 4.1  --  4.3 4.1  CL 104   < > 108  --  99 102  CO2 24  --  22  --   --  24  GLUCOSE 97   < > 117*  --  136* 102*  BUN 7*   < > 9  --  11 11  CREATININE 0.72   < > 0.58 0.64 0.50 0.59  CALCIUM 9.3  --  7.8*  --   --  8.1*  GFRNONAA >60   < > >60 >60  --  >60  GFRAA >60   < > >60 >60  --  >60  ANIONGAP 10  --  6  --   --  6   < > = values in this interval not displayed.     Hematology Recent Labs  Lab 03/03/18 0331 03/03/18 1634 03/03/18 1658 03/04/18 0333  WBC 10.5 9.5  --  11.9*  RBC 2.75* 2.77*  --  2.71*  HGB 8.8* 8.9* 9.2* 8.9*  HCT 27.2* 27.4* 27.0* 27.2*  MCV 98.9 98.9  --  100.4*  MCH 32.0 32.1  --  32.8  MCHC 32.4 32.5  --  32.7  RDW 13.0 12.9  --  12.9  PLT 107* 95*  --  107*    Cardiac EnzymesNo results for input(s): TROPONINI in the last 168 hours. No results for input(s): TROPIPOC in the last 168 hours.   BNPNo results for input(s): BNP, PROBNP in the last 168 hours.   DDimer No results for input(s): DDIMER in the last 168 hours.    Radiology    No results found.  Cardiac Studies     Patient Profile     72 y.o. female with PMH of HL, Graves disease who presented with to the ED with chest pain. Found to have multivessel disease and underwent CABG.   Assessment & Plan    1. CAD s/p CABG: Doing well and planned for discharge today. Remains on 325mg  ASA, ACEi, BB and statin therapy. Worked well with cardiac rehab and has arranged to have help with caring for herself and husband at time at the time of discharge.   2. HL: has been on pravastatin. LDL noted above goal at 88. May need to consider switching to high dose statin with recheck of lipids in 6-8 weeks.   3. Acute blood loss anemia: H/H remains stable at 8.9/27.2  Signed, Reino Bellis, NP  03/08/2018, 8:51 AM  Pager # [phone]   For questions or updates, please contact [place] Please consult [email or web address] for contact info under Cardiology/STEMI.  Patient seen, examined. Available data reviewed. Agree with findings, assessment, and plan as outlined by Reino Bellis, NP.  On my exam she is sitting up in the chair, eager for discharge.  She looks well.  Lungs are clear.  There is a soft systolic ejection murmur at the left lower sternal border, extremities show no edema.  The patient has done very well with multivessel CABG.  Medications are reviewed and I agree as outlined above as she is on aspirin, an ACE inhibitor, beta-blocker, and a statin drug.  Hospital follow-up will be arranged.  All of her questions are answered.  Sherren Mocha, M.D. 03/08/2018 9:59 AM

## 2018-03-08 NOTE — Clinical Social Work Note (Signed)
CSW acknowledges SNF consult. PT recommending "No PT follow up." Patient has orders to discharge home today.  CSW signing off.  Dayton Scrape, [place]

## 2018-03-08 NOTE — Care Management Note (Signed)
Case Management Note Marvetta Gibbons RN,BSN [facility] 1-22 Case Manager  [phone]  Patient Details  Name: Deborah [name] MRN: [id number] Date of Birth: Dec 18, 1945  Subjective/Objective:   Pt admitted with [place], cath revealed 2VD, pt s/p CABGx3 on 03/02/18                 Action/Plan: PTA pt lived at home with spouse, independent with mobility and ADLs. CM to follow for transition of care needs post op  Expected Discharge Date:  03/08/18               Expected Discharge Plan:  Home/Self Care  In-House Referral:  NA  Discharge planning Services  CM Consult, Other - See comment  Post Acute Care Choice:  Durable Medical Equipment Choice offered to:  Patient  DME Arranged:  3-N-1 DME Agency:  [place]:  NA [place] Agency:  NA  Status of Service:  Completed, signed off  If discussed at [place] of Stay Meetings, dates discussed:    Discharge Disposition: home/self care   Additional Comments:  03/08/18- 1030- Lashica Hannay RN, CM- pt for discharge home today- order placed for 3n1- call placed to [facility] with [facility] for DME need- 3n1 to be delivered to room prior to discharge. No further CM needs noted for transition home.   03/05/18- 1500- Cannan Beeck RN, CM- per cardiac rehab pt requested to speak to CM regarding transition needs ?hh vs private duty needs. In to speak with pt at bedside- discussed pt's home situation- home with spouse who has gone through chemo tx however still drives but gets tired easily. They are planning on moving at the end of July and have started packing up home prior to pt ending up in the hospital and having surgery. Pt has sister who lives in [place]. But is out of the country until around July 24. Pt does state she has friends that they can call if they need thing from the store or need assistance but she is interested in having someone come assist several hours/day for household needs.- explained difference between [facility] under Medicare and  private duty assistance for housework needs. Pt states she does not need any DME for discharge and requesting assistance with finding info on private duty- assisted pt with google search at her request and printed info for her. CM will continue to follow for any further transition of care needs.   Dawayne Patricia, RN 03/08/2018, 10:39 AM

## 2018-03-09 MED FILL — Electrolyte-R (PH 7.4) Solution: INTRAVENOUS | Qty: 4000 | Status: AC

## 2018-03-09 MED FILL — Sodium Bicarbonate IV Soln 8.4%: INTRAVENOUS | Qty: 50 | Status: AC

## 2018-03-09 MED FILL — Mannitol IV Soln 20%: INTRAVENOUS | Qty: 500 | Status: AC

## 2018-03-09 MED FILL — Heparin Sodium (Porcine) Inj 1000 Unit/ML: INTRAMUSCULAR | Qty: 10 | Status: AC

## 2018-03-09 MED FILL — Lidocaine HCl(Cardiac) IV PF Soln Pref Syr 100 MG/5ML (2%): INTRAVENOUS | Qty: 5 | Status: AC

## 2018-03-09 MED FILL — Sodium Chloride IV Soln 0.9%: INTRAVENOUS | Qty: 2000 | Status: AC

## 2018-03-10 ENCOUNTER — Telehealth (HOSPITAL_COMMUNITY): Payer: Self-pay

## 2018-03-10 NOTE — Telephone Encounter (Signed)
Patients insurance is active and benefits verified through Medicare A/B - No co-pay, deductible amount of $185.00/$185.00 has been met, no out of pocket, 20% co-insurance, and no pre-authorization is required. Passport/reference [phone]  Will contact patient to see if she is interested in the Cardiac Rehab Program. If interested, patient will need to complete follow up appt. Once completed, patient will be contacted for scheduling upon review by the RN Navigator.

## 2018-03-11 DIAGNOSIS — E7849 Other hyperlipidemia: Secondary | ICD-10-CM | POA: Diagnosis not present

## 2018-03-11 DIAGNOSIS — I25810 Atherosclerosis of coronary artery bypass graft(s) without angina pectoris: Secondary | ICD-10-CM | POA: Diagnosis not present

## 2018-03-11 DIAGNOSIS — Z681 Body mass index (BMI) 19 or less, adult: Secondary | ICD-10-CM | POA: Diagnosis not present

## 2018-03-11 DIAGNOSIS — D62 Acute posthemorrhagic anemia: Secondary | ICD-10-CM | POA: Diagnosis not present

## 2018-03-12 ENCOUNTER — Telehealth (HOSPITAL_COMMUNITY): Payer: Self-pay

## 2018-03-12 NOTE — Telephone Encounter (Signed)
Attempted to contact patient to see if she is interested in the Cardiac Rehab Program - lm on vm

## 2018-03-23 ENCOUNTER — Encounter: Payer: Self-pay | Admitting: Physician Assistant

## 2018-03-23 ENCOUNTER — Ambulatory Visit (INDEPENDENT_AMBULATORY_CARE_PROVIDER_SITE_OTHER): Payer: Medicare Other | Admitting: Physician Assistant

## 2018-03-23 VITALS — BP 112/65 | HR 70 | Ht 64.0 in | Wt 97.4 lb

## 2018-03-23 DIAGNOSIS — E039 Hypothyroidism, unspecified: Secondary | ICD-10-CM | POA: Diagnosis not present

## 2018-03-23 DIAGNOSIS — I25810 Atherosclerosis of coronary artery bypass graft(s) without angina pectoris: Secondary | ICD-10-CM | POA: Diagnosis not present

## 2018-03-23 DIAGNOSIS — I200 Unstable angina: Secondary | ICD-10-CM

## 2018-03-23 DIAGNOSIS — D649 Anemia, unspecified: Secondary | ICD-10-CM

## 2018-03-23 DIAGNOSIS — E785 Hyperlipidemia, unspecified: Secondary | ICD-10-CM

## 2018-03-23 NOTE — Patient Instructions (Addendum)
Medication Instructions:   Your physician recommends that you continue on your current medications as directed. Please refer to the Current Medication list given to you today.   If you need a refill on your cardiac medications before your next appointment, please call your pharmacy.  Labwork: CBC TODAY   RETURN IN 2 MONTHS FOR FASTING LIPIDS LIVER    Testing/Procedures: NONE ORDERED  TODAY    Follow-Up: IN 3 TO 4 MONTHS WITH  DR Tamala Julian    Any Other Special Instructions Will Be Listed Below (If Applicable).

## 2018-03-23 NOTE — Progress Notes (Signed)
Cardiology Office Note    Date:  03/23/2018   ID:  [name], DOB 1945/09/24, MRN [id number]  PCP:  Marton Redwood, MD  Cardiologist:  Dr. Tamala Julian  Chief Complaint  Patient presents with  . Hospitalization Follow-up    post CABG. Seen for Dr. Tamala Julian    History of Present Illness:  Deborah [name] is a 72 y.o. female with hyperlipidemia, history of Graves' disease and hypothyroidism who was recently admitted to the hospital on 02/26/2018 with chest pain concerning for unstable angina.  Troponin was only borderline.  A CT angiogram of the chest showed no evidence of aortic dissection or acute intramural hematoma.  However it did reveal moderate three-vessel coronary calcification.  Cardiac catheterization performed on 02/18/2018 showed 95% mid RCA lesion, 45% distal RCA lesion, 85% proximal LAD lesion, this is followed by a 55% and a 70% proximal LAD lesion.  Mild 2+ MR, EF 65% by LV gram.  Cardiothoracic surgery was consulted.  Echocardiogram obtained on 02/27/2018 showed EF 60 to 65%, grade 1 DD, trace AI, MR and TR.  Patient eventually underwent CABG x3 by Dr. Servando Snare with LIMA to LAD, reverse SVG to diagonal and reverse SVG to PDA.  Patient was extubated in the afternoon of the surgery without difficulty.  She did have volume overload as expected and underwent a short course of diuresis.  She also had postop anemia as well, this will be be followed up as outpatient.  Hemoglobin preop was 5.4.  Otherwise postoperative course was relatively stable without occurrence of arrhythmia  Patient presents today for cardiology office visit.  She is compliant with the high-dose aspirin at this time.  I will obtain a CBC to follow-up on her postop anemia.  Otherwise she denies significant lower extremity edema, orthopnea or PND.  Her chest soreness is not bad.  She does have some arthritis.  I recommend Tylenol for now.  She has upcoming follow-up with cardiothoracic surgery team, after which time,  she can  start on cardiac rehab.  She and her husband are moving to [place] next week.  Otherwise, she is still taking her husband regularly to [facility] for chemotherapy.   Past Medical History:  Diagnosis Date  . Arthritis    "qwhere" (02/26/2018)  . Cancer [facility])    "chest" (02/26/2018)  . Graves' disease   . High cholesterol   . Thyroid disease     Past Surgical History:  Procedure Laterality Date  . ANTERIOR CRUCIATE LIGAMENT REPAIR Left   . BASAL CELL CARCINOMA EXCISION     "chest" (02/26/2018)  . CARPAL TUNNEL RELEASE Right   . CATARACT EXTRACTION W/ INTRAOCULAR LENS  IMPLANT, BILATERAL Bilateral   . CORONARY ARTERY BYPASS GRAFT N/A 03/02/2018   Procedure: CORONARY ARTERY BYPASS GRAFTING (CABG);  Surgeon: Grace Isaac, MD;  Location: [place];  Service: Open Heart Surgery;  Laterality: N/A;  Times 3 using left internal mammary artery to LAD and endoscopically harvested left saphenous vein to Diagonal and PDA.  Marland Kitchen DILATION AND CURETTAGE OF UTERUS    . LEFT HEART CATH AND CORONARY ANGIOGRAPHY N/A 02/26/2018   Procedure: LEFT HEART CATH AND CORONARY ANGIOGRAPHY;  Surgeon: Leonie Man, MD;  Location: [place] CV LAB;  Service: Cardiovascular;  Laterality: N/A;  . RIGHT OOPHORECTOMY Right 1970s  . SHOULDER ARTHROSCOPY W/ ROTATOR CUFF REPAIR Bilateral   . TEE WITHOUT CARDIOVERSION N/A 03/02/2018   Procedure: TRANSESOPHAGEAL ECHOCARDIOGRAM (TEE);  Surgeon: Grace Isaac, MD;  Location: [place];  Service: Open Heart Surgery;  Laterality: N/A;  . TONSILLECTOMY    . TRIGGER FINGER RELEASE Bilateral    "8 of them"    Current Medications: Outpatient Medications Prior to Visit  Medication Sig Dispense Refill  . aspirin EC 325 MG EC tablet Take 1 tablet (325 mg total) by mouth daily. 30 tablet 0  . Calcium Carb-Cholecalciferol (CALCIUM+D3 PO) Take 1 tablet by mouth 2 (two) times daily after a meal.    . Cholecalciferol (VITAMIN D-3) 1000 units CAPS Take 1,000 Units by mouth at bedtime.     Marland Kitchen lisinopril (PRINIVIL,ZESTRIL) 2.5 MG tablet Take 1 tablet (2.5 mg total) by mouth daily. 30 tablet 1  . methimazole (TAPAZOLE) 5 MG tablet Take 5 mg by mouth See admin instructions. Take 5 mg by mouth at bedtime on Mon/Tues/Wed/Thurs/Fri/Sat  3  . metoprolol tartrate (LOPRESSOR) 25 MG tablet Take 1 tablet (25 mg total) by mouth 2 (two) times daily. 60 tablet 1  . Multiple Vitamins-Calcium (ONE-A-DAY WOMENS FORMULA) TABS Take 1 tablet by mouth daily.    . pravastatin (PRAVACHOL) 40 MG tablet Take 40 mg by mouth at bedtime.    . traMADol (ULTRAM) 50 MG tablet Take 1 tablet (50 mg total) by mouth every 6 (six) hours as needed for moderate pain. 30 tablet 0   No facility-administered medications prior to visit.      Allergies:   Patient has no known allergies.   Social History   Socioeconomic History  . Marital status: Married    Spouse name: Not on file  . Number of children: Not on file  . Years of education: Not on file  . Highest education level: Not on file  Occupational History  . Not on file  Social Needs  . Financial resource strain: Not on file  . Food insecurity:    Worry: Not on file    Inability: Not on file  . Transportation needs:    Medical: Not on file    Non-medical: Not on file  Tobacco Use  . Smoking status: Former Smoker    Packs/day: 0.50    Years: 30.00    Pack years: 15.00    Types: Cigarettes  . Smokeless tobacco: Never Used  . Tobacco comment: 02/26/2018 "nothing since before 2000"  Substance and Sexual Activity  . Alcohol use: Yes    Alcohol/week: 3.6 oz    Types: 6 Standard drinks or equivalent per week  . Drug use: Never  . Sexual activity: Not Currently  Lifestyle  . Physical activity:    Days per week: Not on file    Minutes per session: Not on file  . Stress: Not on file  Relationships  . Social connections:    Talks on phone: Not on file    Gets together: Not on file    Attends religious service: Not on file    Active member of club  or organization: Not on file    Attends meetings of clubs or organizations: Not on file    Relationship status: Not on file  Other Topics Concern  . Not on file  Social History Narrative  . Not on file     Family History:  The patient's family history includes CAD in her father; Hypercholesterolemia in her father and sister; Multiple myeloma in her brother; Osteoporosis in her mother.   ROS:   Please see the history of present illness.    ROS All other systems reviewed and are negative.   PHYSICAL EXAM:  VS:  BP 112/65   Pulse 70   Ht 5' 4"  (1.626 m)   Wt 97 lb 6.4 oz (44.2 kg)   BMI 16.72 kg/m    GEN: Well nourished, well developed, in no acute distress  HEENT: normal  Neck: no JVD, carotid bruits, or masses Cardiac: RRR; no murmurs, rubs, or gallops,no edema.  Sternal scar well-healed. Respiratory:  clear to auscultation bilaterally, normal work of breathing GI: soft, nontender, nondistended, + BS MS: no deformity or atrophy  Skin: warm and dry, no rash Neuro:  Alert and Oriented x 3, Strength and sensation are intact Psych: euthymic mood, full affect  Wt Readings from Last 3 Encounters:  03/23/18 97 lb 6.4 oz (44.2 kg)  03/08/18 97 lb 7.1 oz (44.2 kg)      Studies/Labs Reviewed:   EKG:  EKG is ordered today.  The ekg ordered today demonstrates normal sinus rhythm with PAC.  Diffuse T wave inversion in inferolateral leads.  Unchanged when compared to the previous EKG.  Recent Labs: 03/01/2018: ALT 17 03/03/2018: Magnesium 2.1 03/04/2018: BUN 11; Creatinine, Ser 0.59; Hemoglobin 8.9; Platelets 107; Potassium 4.1; Sodium 132   Lipid Panel    Component Value Date/Time   CHOL 161 02/27/2018 0518   TRIG 45 02/27/2018 0518   HDL 64 02/27/2018 0518   CHOLHDL 2.5 02/27/2018 0518   VLDL 9 02/27/2018 0518   LDLCALC 88 02/27/2018 0518    Additional studies/ records that were reviewed today include:   Cath 02/26/2018  Culprit lesion: Mid RCA lesion is 95% stenosed.  Heavily calcified irregular lesion  Prox RCA to Mid RCA lesion is 25% stenosed. Dist RCA lesion is 45% stenosed.  Prox LAD-1 lesion is 85% stenosed. - Prox LAD-2 lesion is 55% stenosed. - Prox LAD-3 lesion is 70% stenosed. (tandem lesions)  The left ventricular systolic function is normal. The left ventricular ejection fraction is greater than 65% by visual estimate.  LV end diastolic pressure is normal.  There is mild (2+) mitral regurgitation.   Heavily calcified coronary arteries with severe mid RCA 99% stenosis and diffuse 70 to 80% ostial-proximal LAD stenosis. Normal LV function and EDP.  Patient has severe disease.  We will consult CT surgery.  She will be admitted to the stepdown unit.  Placed on IV heparin.  If necessary IV nitroglycerin.  Continue risk factor modification - aspirin, statin, beta-blocker etc.     Echo 02/27/2018 LV EF: 60% -   65% Study Conclusions  - Left ventricle: The cavity size was normal. Wall thickness was   normal. Systolic function was normal. The estimated ejection   fraction was in the range of 60% to 65%. Wall motion was normal;   there were no regional wall motion abnormalities. Doppler   parameters are consistent with abnormal left ventricular   relaxation (grade 1 diastolic dysfunction). - Aortic valve: There was trivial regurgitation. - Atrial septum: There was an atrial septal aneurysm.  Impressions:  - Normal LV systolic function; mild diastolic dysfunction; trace   AI, MR and TR.    CABG x3 03/02/2018 PROCEDURE:  Procedure(s) with comments: CORONARY ARTERY BYPASS GRAFTING (CABG) (N/A) - Times 3 using left internal mammary artery to LAD and endoscopically harvested left saphenous vein to Diagonal and PDA. TRANSESOPHAGEAL ECHOCARDIOGRAM (TEE) (N/A)    ASSESSMENT:    1. Coronary artery disease involving coronary bypass graft of native heart without angina pectoris   2. Anemia, unspecified type   3. Hyperlipidemia,  unspecified hyperlipidemia type  4. Hypothyroidism, unspecified type      PLAN:  In order of problems listed above:  1. CAD status post CABG x3: LIMA to LAD, reverse SVG to diagonal, reverse SVG to PDA.  Sternal wound well-healed.  No significant arrhythmia after surgery.  She appears to be euvolemic on physical exam.  Continue on the high-dose aspirin and Pravachol.  2. Postop anemia: Obtain CBC  3. Hyperlipidemia: On low-dose Pravachol, lab work obtained during the recent hospitalization showed very well-controlled total cholesterol, triglyceride and HDL, borderline high LDL.  Check fasting lipid panel and LFT in 2 months.  4. Hypothyroidism: Managed by primary care provider   Medication Adjustments/Labs and Tests Ordered: Current medicines are reviewed at length with the patient today.  Concerns regarding medicines are outlined above.  Medication changes, Labs and Tests ordered today are listed in the Patient Instructions below. Patient Instructions  Medication Instructions:   Your physician recommends that you continue on your current medications as directed. Please refer to the Current Medication list given to you today.   If you need a refill on your cardiac medications before your next appointment, please call your pharmacy.  Labwork: CBC TODAY   RETURN IN 2 MONTHS FOR FASTING LIPIDS LIVER    Testing/Procedures: NONE ORDERED  TODAY    Follow-Up: IN 3 TO 4 MONTHS WITH  DR Tamala Julian    Any Other Special Instructions Will Be Listed Below (If Applicable).                                                                                                                                                      Hilbert Corrigan, [place]  03/23/2018 1:37 PM    [facility] [address], [place], [place]  [address] Phone: [phone]; Fax: [phone]

## 2018-03-24 ENCOUNTER — Telehealth (HOSPITAL_COMMUNITY): Payer: Self-pay

## 2018-03-24 NOTE — Telephone Encounter (Signed)
Called patient to see if she was interested in participating in the Cardiac Rehab Program. Patient stated yes. Patient will come in for orientation on 03/24/18 @ 8:30AM and will attend the 9:45AM exercise class.  Mailed homework package.

## 2018-03-26 ENCOUNTER — Other Ambulatory Visit: Payer: Self-pay

## 2018-03-26 DIAGNOSIS — E785 Hyperlipidemia, unspecified: Secondary | ICD-10-CM

## 2018-03-26 DIAGNOSIS — D649 Anemia, unspecified: Secondary | ICD-10-CM

## 2018-03-26 LAB — CBC
HEMATOCRIT: 30.6 % — AB (ref 34.0–46.6)
HEMOGLOBIN: 10.5 g/dL — AB (ref 11.1–15.9)
MCH: 31.3 pg (ref 26.6–33.0)
MCHC: 34.3 g/dL (ref 31.5–35.7)
MCV: 91 fL (ref 79–97)
Platelets: 367 10*3/uL (ref 150–450)
RBC: 3.35 x10E6/uL — ABNORMAL LOW (ref 3.77–5.28)
RDW: 12.9 % (ref 12.3–15.4)
WBC: 6.1 10*3/uL (ref 3.4–10.8)

## 2018-03-30 NOTE — Progress Notes (Signed)
Anemia much improved, now red blood cell count only borderline low

## 2018-04-01 ENCOUNTER — Telehealth: Payer: Self-pay | Admitting: Physician Assistant

## 2018-04-01 NOTE — Telephone Encounter (Signed)
Patient returning call.

## 2018-04-01 NOTE — Telephone Encounter (Signed)
New Message ° ° °Patient is returning call in reference to labs. Please call to discuss.  °

## 2018-04-02 NOTE — Telephone Encounter (Signed)
Patient notified directly.

## 2018-04-03 ENCOUNTER — Other Ambulatory Visit: Payer: Self-pay | Admitting: Physician Assistant

## 2018-04-07 ENCOUNTER — Other Ambulatory Visit: Payer: Self-pay | Admitting: Physician Assistant

## 2018-04-07 DIAGNOSIS — M9903 Segmental and somatic dysfunction of lumbar region: Secondary | ICD-10-CM | POA: Diagnosis not present

## 2018-04-07 DIAGNOSIS — M9901 Segmental and somatic dysfunction of cervical region: Secondary | ICD-10-CM | POA: Diagnosis not present

## 2018-04-07 DIAGNOSIS — M9905 Segmental and somatic dysfunction of pelvic region: Secondary | ICD-10-CM | POA: Diagnosis not present

## 2018-04-07 DIAGNOSIS — M9902 Segmental and somatic dysfunction of thoracic region: Secondary | ICD-10-CM | POA: Diagnosis not present

## 2018-04-12 DIAGNOSIS — M9901 Segmental and somatic dysfunction of cervical region: Secondary | ICD-10-CM | POA: Diagnosis not present

## 2018-04-12 DIAGNOSIS — M9903 Segmental and somatic dysfunction of lumbar region: Secondary | ICD-10-CM | POA: Diagnosis not present

## 2018-04-12 DIAGNOSIS — M9905 Segmental and somatic dysfunction of pelvic region: Secondary | ICD-10-CM | POA: Diagnosis not present

## 2018-04-12 DIAGNOSIS — M9902 Segmental and somatic dysfunction of thoracic region: Secondary | ICD-10-CM | POA: Diagnosis not present

## 2018-04-19 ENCOUNTER — Ambulatory Visit (INDEPENDENT_AMBULATORY_CARE_PROVIDER_SITE_OTHER): Payer: Self-pay | Admitting: Physician Assistant

## 2018-04-19 ENCOUNTER — Encounter: Payer: Self-pay | Admitting: Physician Assistant

## 2018-04-19 ENCOUNTER — Other Ambulatory Visit: Payer: Self-pay | Admitting: Cardiothoracic Surgery

## 2018-04-19 ENCOUNTER — Other Ambulatory Visit: Payer: Self-pay

## 2018-04-19 ENCOUNTER — Ambulatory Visit
Admission: RE | Admit: 2018-04-19 | Discharge: 2018-04-19 | Disposition: A | Discharge status: Home / Self Care | Payer: Medicare Other | Source: Ambulatory Visit | Attending: Cardiothoracic Surgery | Admitting: Cardiothoracic Surgery

## 2018-04-19 VITALS — BP 110/70 | HR 66 | Resp 16 | Ht 64.0 in | Wt 98.0 lb

## 2018-04-19 DIAGNOSIS — Z951 Presence of aortocoronary bypass graft: Secondary | ICD-10-CM

## 2018-04-19 DIAGNOSIS — I200 Unstable angina: Secondary | ICD-10-CM

## 2018-04-19 DIAGNOSIS — I214 Non-ST elevation (NSTEMI) myocardial infarction: Secondary | ICD-10-CM

## 2018-04-19 NOTE — Patient Instructions (Addendum)
  You may continue to gradually increase your physical activity as tolerated.  Refrain from any heavy lifting or strenuous use of your arms and shoulders until at least 8 weeks from the time of your surgery, and avoid activities that cause increased pain in your chest on the side of your surgical incision.  Otherwise you may continue to increase activities without any particular limitations.  Increase the intensity and duration of physical activity gradually.  You are encouraged to enroll and participate in the outpatient cardiac rehab program beginning as soon as practical.

## 2018-04-19 NOTE — Progress Notes (Signed)
Coronary artery bypass grafting x3 with the left internal mammary to the left anterior  descending coronary artery, reverse saphenous vein graft to the diagonal coronary artery, reverse saphenous vein graft to the posterior descending coronary artery with left leg greater saphenous thigh endovein harvesting and incidental lymph node biopsy of the left internal mammary artery chain.  HPI:  Patient returns for routine postoperative follow-up having undergone a CABG x 3 on 03/02/2018 by Dr. Servando Snare.for Since hospital discharge the patient reports, she is doing well over all. On occasion, she will get shortness of breath but only briefly and has no other complaints (no chest pain).   Current Outpatient Medications  Medication Sig Dispense Refill  . aspirin EC 325 MG EC tablet Take 1 tablet (325 mg total) by mouth daily. 30 tablet 0  . Calcium Carb-Cholecalciferol (CALCIUM+D3 PO) Take 1 tablet by mouth 2 (two) times daily after a meal.    . Cholecalciferol (VITAMIN D-3) 1000 units CAPS Take 1,000 Units by mouth at bedtime.    Marland Kitchen lisinopril (PRINIVIL,ZESTRIL) 2.5 MG tablet Take 1 tablet (2.5 mg total) by mouth daily. 30 tablet 1  . methimazole (TAPAZOLE) 5 MG tablet Take 5 mg by mouth See admin instructions. Take 5 mg by mouth at bedtime on Mon/Tues/Wed/Thurs/Fri/Sat  3  . metoprolol tartrate (LOPRESSOR) 25 MG tablet TAKE 1 TABLET BY MOUTH TWICE A DAY 60 tablet 1  . Multiple Vitamins-Calcium (ONE-A-DAY WOMENS FORMULA) TABS Take 1 tablet by mouth daily.    . pravastatin (PRAVACHOL) 40 MG tablet Take 40 mg by mouth at bedtime.    Vital Signs:BP 110/70, HR 66, RR 16, Oxygenation 98% on room air.   Physical Exam: CV-RRR Pulmonary-Clear to auscultation bilaterally Abdomen-Soft, non tender, bowel sounds present Extremities-No LE edema Wounds-Clean and dry, no sign of infection.  Diagnostic Tests: CLINICAL DATA:  Status post CABG on March 02, 2018. No current complaints. Former  smoker.  EXAM: CHEST - 2 VIEW  COMPARISON:  Portable chest x-ray of March 04, 2018  FINDINGS: The lungs are mildly hyperinflated. There is no focal infiltrate. A few coarse lung markings persist in the left lower lobe posteriorly. There is no pleural effusion or pneumothorax. The heart and pulmonary vascularity are normal. There is calcification in the wall of the aortic arch. The sternal wires are intact. There is stable scoliosis of the thoracolumbar spine.  IMPRESSION: No CHF, pneumonia, nor postprocedure complication. Minimal atelectasis or scarring in the left lower lobe.  Thoracic aortic atherosclerosis.   Electronically Signed   By: David  Martinique M.D.   On: 04/19/2018 12:56  Impression and Plan:  She has already seen Almyra Deforest PA-C with cardiology on 03/23/2018. There were no changes made to her medications. CBC was drawn and anemia was improved as H an H was up to 10.5 and 30.6. Also, thrombocytopenia Was resolved as platelets were up to 367,000. She was instructed to continue with sternal precautions (I.e. No lifting more than 10 pounds for the next 1-2 weeks). She has already been  driving. Finally, she was encouraged to participate in cardiac rehab. She is scheduled to start the first week of September. Regarding her occasional shortness of breath, should improve with more time. She was instructed if it worsens or she develops LE edema, she is to call the office. She will return to see Dr. Servando Snare PRN.  Nani Skillern, PA-C [facility] [phone]

## 2018-04-21 DIAGNOSIS — E7849 Other hyperlipidemia: Secondary | ICD-10-CM | POA: Diagnosis not present

## 2018-04-21 DIAGNOSIS — M859 Disorder of bone density and structure, unspecified: Secondary | ICD-10-CM | POA: Diagnosis not present

## 2018-04-21 DIAGNOSIS — R82998 Other abnormal findings in urine: Secondary | ICD-10-CM | POA: Diagnosis not present

## 2018-04-21 DIAGNOSIS — E0590 Thyrotoxicosis, unspecified without thyrotoxic crisis or storm: Secondary | ICD-10-CM | POA: Diagnosis not present

## 2018-04-26 DIAGNOSIS — M8580 Other specified disorders of bone density and structure, unspecified site: Secondary | ICD-10-CM | POA: Diagnosis not present

## 2018-04-26 DIAGNOSIS — M19041 Primary osteoarthritis, right hand: Secondary | ICD-10-CM | POA: Diagnosis not present

## 2018-04-26 DIAGNOSIS — M4800 Spinal stenosis, site unspecified: Secondary | ICD-10-CM | POA: Diagnosis not present

## 2018-04-26 DIAGNOSIS — E0590 Thyrotoxicosis, unspecified without thyrotoxic crisis or storm: Secondary | ICD-10-CM | POA: Diagnosis not present

## 2018-04-26 DIAGNOSIS — E7849 Other hyperlipidemia: Secondary | ICD-10-CM | POA: Diagnosis not present

## 2018-04-26 DIAGNOSIS — Z23 Encounter for immunization: Secondary | ICD-10-CM | POA: Diagnosis not present

## 2018-04-26 DIAGNOSIS — Z1389 Encounter for screening for other disorder: Secondary | ICD-10-CM | POA: Diagnosis not present

## 2018-04-26 DIAGNOSIS — I25810 Atherosclerosis of coronary artery bypass graft(s) without angina pectoris: Secondary | ICD-10-CM | POA: Diagnosis not present

## 2018-04-26 DIAGNOSIS — Z681 Body mass index (BMI) 19 or less, adult: Secondary | ICD-10-CM | POA: Diagnosis not present

## 2018-04-26 DIAGNOSIS — E0500 Thyrotoxicosis with diffuse goiter without thyrotoxic crisis or storm: Secondary | ICD-10-CM | POA: Diagnosis not present

## 2018-04-26 DIAGNOSIS — Z0000 Encounter for general adult medical examination without abnormal findings: Secondary | ICD-10-CM | POA: Diagnosis not present

## 2018-04-26 DIAGNOSIS — F17200 Nicotine dependence, unspecified, uncomplicated: Secondary | ICD-10-CM | POA: Diagnosis not present

## 2018-04-27 ENCOUNTER — Telehealth (HOSPITAL_COMMUNITY): Payer: Self-pay | Admitting: Pharmacist

## 2018-04-27 DIAGNOSIS — M9901 Segmental and somatic dysfunction of cervical region: Secondary | ICD-10-CM | POA: Diagnosis not present

## 2018-04-27 DIAGNOSIS — M9903 Segmental and somatic dysfunction of lumbar region: Secondary | ICD-10-CM | POA: Diagnosis not present

## 2018-04-27 DIAGNOSIS — M9905 Segmental and somatic dysfunction of pelvic region: Secondary | ICD-10-CM | POA: Diagnosis not present

## 2018-04-27 DIAGNOSIS — M9902 Segmental and somatic dysfunction of thoracic region: Secondary | ICD-10-CM | POA: Diagnosis not present

## 2018-04-27 NOTE — Telephone Encounter (Signed)
Cardiac Rehab Medication Review by a Pharmacist  Does the patient  feel that his/her medications are working for him/her?  yes  Has the patient been experiencing any side effects to the medications prescribed?  no  Does the patient measure his/her own blood pressure or blood glucose at home?  no  Does the patient have any problems obtaining medications due to transportation or finances?   yes  Understanding of regimen: good Understanding of indications: good Potential of compliance: good    Pharmacist comments: Patient reports not checking blood pressure at home on a regular basis but that it usually runs around 110/80.    Gwenlyn Found, Sherian Rein D PGY1 Pharmacy Resident  Phone [phone] 04/27/2018   4:00 PM

## 2018-04-30 DIAGNOSIS — Z1212 Encounter for screening for malignant neoplasm of rectum: Secondary | ICD-10-CM | POA: Diagnosis not present

## 2018-05-04 ENCOUNTER — Encounter (HOSPITAL_COMMUNITY): Payer: Self-pay

## 2018-05-04 ENCOUNTER — Encounter (HOSPITAL_COMMUNITY)
Admission: RE | Admit: 2018-05-04 | Discharge: 2018-05-04 | Disposition: A | Discharge status: Home / Self Care | Payer: Medicare Other | Source: Ambulatory Visit | Attending: Interventional Cardiology | Admitting: Interventional Cardiology

## 2018-05-04 VITALS — BP 104/60 | HR 65 | Ht 63.5 in | Wt 97.2 lb

## 2018-05-04 DIAGNOSIS — E7800 Pure hypercholesterolemia, unspecified: Secondary | ICD-10-CM | POA: Insufficient documentation

## 2018-05-04 DIAGNOSIS — M1990 Unspecified osteoarthritis, unspecified site: Secondary | ICD-10-CM | POA: Insufficient documentation

## 2018-05-04 DIAGNOSIS — Z7982 Long term (current) use of aspirin: Secondary | ICD-10-CM | POA: Insufficient documentation

## 2018-05-04 DIAGNOSIS — Z79899 Other long term (current) drug therapy: Secondary | ICD-10-CM | POA: Insufficient documentation

## 2018-05-04 DIAGNOSIS — E079 Disorder of thyroid, unspecified: Secondary | ICD-10-CM | POA: Insufficient documentation

## 2018-05-04 DIAGNOSIS — Z951 Presence of aortocoronary bypass graft: Secondary | ICD-10-CM | POA: Insufficient documentation

## 2018-05-04 HISTORY — DX: Atherosclerotic heart disease of native coronary artery without angina pectoris: I25.10

## 2018-05-04 NOTE — Progress Notes (Signed)
Patient here for orientation this morning. Telemetry rhythm sinus resting rate 64. Nonsustained  Short run of sinus tach rate 120's with rate back in the 70's.Blood pressure 104/70. Oxygen saturation 98% on room air. Harlan Stains NP paged and notified. No new order received.Will continue to monitor the patient throughout  the program. Will fax ECG tracings sheets to Dr. Thompson Caul office for review.Barnet Pall, RN,BSN 05/04/2018 8:56 AM

## 2018-05-04 NOTE — Progress Notes (Addendum)
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  aspirin EC 325 MG EC tablet, Take 1 tablet (325 mg total) by mouth daily., Disp: 30 tablet, Rfl: 0 .  Calcium Carb-Cholecalciferol (CALCIUM+D3 PO), Take 1 tablet by mouth 2 (two) times daily after a meal., Disp: , Rfl:  .  Cholecalciferol (VITAMIN D-3) 1000 units CAPS, Take 1,000 Units by mouth at bedtime., Disp: , Rfl:  .  lisinopril (PRINIVIL,ZESTRIL) 2.5 MG tablet, Take 1 tablet (2.5 mg total) by mouth daily., Disp: 30 tablet, Rfl: 1 .  methimazole (TAPAZOLE) 5 MG tablet, Take 5 mg by mouth See admin instructions. Take 5 mg by mouth at bedtime on Mon/Tues/Wed/Thurs/Fri/Sat, Disp: , Rfl: 3 .  metoprolol tartrate (LOPRESSOR) 25 MG tablet, TAKE 1 TABLET BY MOUTH TWICE A DAY, Disp: 60 tablet, Rfl: 1 .  Multiple Vitamins-Calcium (ONE-A-DAY WOMENS FORMULA) TABS, Take 1 tablet by mouth daily., Disp: , Rfl:  .  pravastatin (PRAVACHOL) 40 MG tablet, Take 40 mg by mouth at bedtime., Disp: , Rfl:   Past Medical History: Past Medical History:  Diagnosis Date  . Arthritis    "qwhere" (02/26/2018)  . Cancer [facility])    "chest" (02/26/2018)  . Coronary artery disease   . Graves' disease   . High cholesterol   . Thyroid disease     Tobacco Use: Social History   Tobacco Use  Smoking Status Former Smoker  . Packs/day: 0.50  . Years: 30.00  . Pack years: 15.00  . Types: Cigarettes  Smokeless Tobacco Never Used  Tobacco Comment   02/26/2018 "nothing since  before 2000"    Labs: Recent Review Flowsheet Data    Labs for ITP Cardiac and Pulmonary Rehab Latest Ref Rng & Units 03/02/2018 03/02/2018 03/02/2018 03/02/2018 03/03/2018   Cholestrol 0 - 200 mg/dL - - - - -   LDLCALC 0 - 99 mg/dL - - - - -   HDL >40 mg/dL - - - - -   Trlycerides <150 mg/dL - - - - -   Hemoglobin A1c 4.8 - 5.6 % - - - - -   PHART 7.350 - 7.450 7.380 7.321(L) 7.313(L) - -   PCO2ART 32.0 - 48.0 mmHg 30.5(L) 39.4 41.4 - -   HCO3 20.0 - 28.0 mmol/L 18.3(L) 20.4 20.9 - -   TCO2 22 - 32 mmol/L 19(L) 22 22 21(L) 22   ACIDBASEDEF 0.0 - 2.0 mmol/L 6.0(H) 5.0(H) 5.0(H) - -   O2SAT % 99.0 99.0 98.0 - -      Capillary Blood Glucose: Lab Results  Component Value Date   GLUCAP 95 03/04/2018   GLUCAP 105 (H) 03/04/2018   GLUCAP 87 03/04/2018   GLUCAP 94 03/03/2018   GLUCAP 93 03/03/2018     Exercise Target Goals: Exercise Program Goal: Individual exercise prescription set using results from initial 6 min walk test and THRR while considering  patient's activity barriers and safety.   Exercise Prescription Goal: Initial exercise prescription builds to 30-45 minutes  a day of aerobic activity, 2-3 days per week.  Home exercise guidelines will be given to patient during program as part of exercise prescription that the participant will acknowledge.  Activity Barriers & Risk Stratification: Activity Barriers & Cardiac Risk Stratification - 05/04/18 0851      Activity Barriers & Cardiac Risk Stratification   Activity Barriers  Other (comment)    Comments  Left knee ACL replacement. Bilateral shoulder repair.    Cardiac Risk Stratification  High       6 Minute Walk: 6 Minute Walk    Row Name 05/04/18 0840         6 Minute Walk   Phase  Initial     Distance  1866 feet     Walk Time  6 minutes     # of Rest Breaks  0     MPH  3.53     METS  4.09     RPE  11     VO2 Peak  14.31     Symptoms  No     Resting HR  65 bpm     Resting BP  104/60     Resting Oxygen  Saturation   98 %     Exercise Oxygen Saturation  during 6 min walk  99 %     Max Ex. HR  93 bpm     Max Ex. BP  124/72     2 Minute Post BP  122/62        Oxygen Initial Assessment:   Oxygen Re-Evaluation:   Oxygen Discharge (Final Oxygen Re-Evaluation):   Initial Exercise Prescription: Initial Exercise Prescription - 05/04/18 0900      Date of Initial Exercise RX and Referring Provider   Date  05/04/18    Referring Provider  Daneen Schick, MD      Treadmill   MPH  2.5    Grade  1    Minutes  10    METs  3.26      Bike   Level  0.7    Minutes  10    METs  3.94      NuStep   Level  4    SPM  85    Minutes  10    METs  3      Prescription Details   Frequency (times per week)  3    Duration  Progress to 30 minutes of continuous aerobic without signs/symptoms of physical distress      Intensity   THRR 40-80% of Max Heartrate  59-118    Ratings of Perceived Exertion  11-13    Perceived Dyspnea  0-4      Progression   Progression  Continue to progress workloads to maintain intensity without signs/symptoms of physical distress.      Resistance Training   Training Prescription  Yes    Weight  3lbs    Reps  10-15       Perform Capillary Blood Glucose checks as needed.  Exercise Prescription Changes:   Exercise Comments:   Exercise Goals and Review: Exercise Goals    Row Name 05/04/18 0853             Exercise Goals   Increase Physical Activity  Yes       Intervention  Provide advice, education, support and counseling about physical activity/exercise needs.;Develop an individualized exercise prescription for aerobic and resistive training based on initial evaluation findings, risk stratification, comorbidities and participant's personal goals.  Expected Outcomes  Short Term: Attend rehab on a regular basis to increase amount of physical activity.;Long Term: Exercising regularly at least 3-5 days a week.;Long Term: Add in home exercise to make  exercise part of routine and to increase amount of physical activity.       Increase Strength and Stamina  Yes       Intervention  Provide advice, education, support and counseling about physical activity/exercise needs.;Develop an individualized exercise prescription for aerobic and resistive training based on initial evaluation findings, risk stratification, comorbidities and participant's personal goals.       Expected Outcomes  Short Term: Increase workloads from initial exercise prescription for resistance, speed, and METs.;Short Term: Perform resistance training exercises routinely during rehab and add in resistance training at home;Long Term: Improve cardiorespiratory fitness, muscular endurance and strength as measured by increased METs and functional capacity (6MWT)       Able to understand and use rate of perceived exertion (RPE) scale  Yes       Intervention  Provide education and explanation on how to use RPE scale       Expected Outcomes  Short Term: Able to use RPE daily in rehab to express subjective intensity level;Long Term:  Able to use RPE to guide intensity level when exercising independently       Knowledge and understanding of Target Heart Rate Range (THRR)  Yes       Intervention  Provide education and explanation of THRR including how the numbers were predicted and where they are located for reference       Expected Outcomes  Short Term: Able to state/look up THRR;Long Term: Able to use THRR to govern intensity when exercising independently;Short Term: Able to use daily as guideline for intensity in rehab       Able to check pulse independently  Yes       Intervention  Provide education and demonstration on how to check pulse in carotid and radial arteries.;Review the importance of being able to check your own pulse for safety during independent exercise       Expected Outcomes  Short Term: Able to explain why pulse checking is important during independent exercise;Long Term: Able  to check pulse independently and accurately       Understanding of Exercise Prescription  Yes       Intervention  Provide education, explanation, and written materials on patient's individual exercise prescription       Expected Outcomes  Short Term: Able to explain program exercise prescription;Long Term: Able to explain home exercise prescription to exercise independently          Exercise Goals Re-Evaluation :   Discharge Exercise Prescription (Final Exercise Prescription Changes):   Nutrition:  Target Goals: Understanding of nutrition guidelines, daily intake of sodium 1500mg , cholesterol 200mg , calories 30% from fat and 7% or less from saturated fats, daily to have 5 or more servings of fruits and vegetables.  Biometrics: Pre Biometrics - 05/04/18 0803      Pre Biometrics   Height  5' 3.5" (1.613 m)    Weight  44.1 kg    Waist Circumference  24 inches    Hip Circumference  32.25 inches    Waist to Hip Ratio  0.74 %    BMI (Calculated)  16.95    Triceps Skinfold  8 mm    % Body Fat  23.7 %    Grip Strength  24 kg    Flexibility  12 in  Single Leg Stand  30 seconds        Nutrition Therapy Plan and Nutrition Goals: Nutrition Therapy & Goals - 05/04/18 0939      Nutrition Therapy   Diet  heart healthy      Personal Nutrition Goals   Nutrition Goal  pt will increase the number of meals and snacks consumed daily      Intervention Plan   Intervention  Prescribe, educate and counsel regarding individualized specific dietary modifications aiming towards targeted core components such as weight, hypertension, lipid management, diabetes, heart failure and other comorbidities.    Expected Outcomes  Short Term Goal: Understand basic principles of dietary content, such as calories, fat, sodium, cholesterol and nutrients.       Nutrition Assessments: Nutrition Assessments - 05/04/18 0940      MEDFICTS Scores   Pre Score  36       Nutrition Goals  Re-Evaluation:   Nutrition Goals Re-Evaluation:   Nutrition Goals Discharge (Final Nutrition Goals Re-Evaluation):   Psychosocial: Target Goals: Acknowledge presence or absence of significant depression and/or stress, maximize coping skills, provide positive support system. Participant is able to verbalize types and ability to use techniques and skills needed for reducing stress and depression.  Initial Review & Psychosocial Screening: Initial Psych Review & Screening - 05/04/18 1018      Initial Review   Current issues with  Current Stress Concerns    Source of Stress Concerns  --   Recently downsized and moved   Comments  Husband is currently receiving cancer treatment      [place]?  Yes   Anelise has her husband and sister for support     Barriers   Psychosocial barriers to participate in program  The patient should benefit from training in stress management and relaxation.      Screening Interventions   Interventions  Encouraged to exercise    Expected Outcomes  Long Term Goal: Stressors or current issues are controlled or eliminated.       Quality of Life Scores: Quality of Life - 05/04/18 0953      Quality of Life   Select  Quality of Life      Quality of Life Scores   Health/Function Pre  25.07 %    Socioeconomic Pre  27.14 %    Psych/Spiritual Pre  28.57 %    Family Pre  28.5 %    GLOBAL Pre  26.67 %      Scores of 19 and below usually indicate a poorer quality of life in these areas.  A difference of  2-3 points is a clinically meaningful difference.  A difference of 2-3 points in the total score of the Quality of Life Index has been associated with significant improvement in overall quality of life, self-image, physical symptoms, and general health in studies assessing change in quality of life.  PHQ-9: Recent Review Flowsheet Data    There is no flowsheet data to display.     Interpretation of Total Score  Total Score  Depression Severity:  1-4 = Minimal depression, 5-9 = Mild depression, 10-14 = Moderate depression, 15-19 = Moderately severe depression, 20-27 = Severe depression   Psychosocial Evaluation and Intervention:   Psychosocial Re-Evaluation:   Psychosocial Discharge (Final Psychosocial Re-Evaluation):   Vocational Rehabilitation: Provide vocational rehab assistance to qualifying candidates.   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 Participant will state understanding/return demonstration of topics presented.  Learning Barriers/Preferences: Learning Barriers/Preferences - 05/04/18 0910      Learning Barriers/Preferences   Learning Barriers  None    Learning Preferences  None       Education Topics: Count Your Pulse:  -Group instruction provided by verbal instruction, demonstration, patient participation and written materials to support subject.  Instructors address importance of being able to find your pulse and how to count your pulse when at home without a heart monitor.  Patients get hands on experience counting their pulse with staff help and individually.   Heart Attack, Angina, and Risk Factor Modification:  -Group instruction provided by verbal instruction, video, and written materials to support subject.  Instructors address signs and symptoms of angina and heart attacks.    Also discuss risk factors for heart disease and how to make changes to improve heart health risk factors.   Functional Fitness:  -Group instruction provided by verbal instruction, demonstration, patient participation, and written materials to support subject.   Instructors address safety measures for doing things around the house.  Discuss how to get up and down off the floor, how to pick things up properly, how to safely get out of a chair without assistance, and balance training.   Meditation and Mindfulness:  -Group instruction provided by verbal instruction, patient participation, and written materials to support subject.  Instructor addresses importance of mindfulness and meditation practice to help reduce stress and improve awareness.  Instructor also leads participants through a meditation exercise.    Stretching for Flexibility and Mobility:  -Group instruction provided by verbal instruction, patient participation, and written materials to support subject.  Instructors lead participants through series of stretches that are designed to increase flexibility thus improving mobility.  These stretches are additional exercise for major muscle groups that are typically performed during regular warm up and cool down.   Hands Only CPR:  -Group verbal, video, and participation provides a basic overview of AHA guidelines for community CPR. Role-play of emergencies allow participants the opportunity to practice calling for help and chest compression technique with discussion of AED use.   Hypertension: -Group verbal and written instruction that provides a basic overview of hypertension including the most recent diagnostic guidelines, risk factor reduction with self-care instructions and medication management.    Nutrition I class: Heart Healthy Eating:  -Group instruction provided by PowerPoint slides, verbal discussion, and written materials to support subject matter. The instructor gives an explanation and review of the Therapeutic Lifestyle Changes diet recommendations, which includes a discussion on lipid goals, dietary fat, sodium, fiber, plant stanol/sterol esters, sugar, and the components of a well-balanced, healthy diet.   Nutrition II class:  Lifestyle Skills:  -Group instruction provided by PowerPoint slides, verbal discussion, and written materials to support subject matter. The instructor gives an explanation and review of label reading, grocery shopping for heart health, heart healthy recipe modifications, and ways to make healthier choices when eating out.   Diabetes Question & Answer:  -Group instruction provided by PowerPoint slides, verbal discussion, and written materials to support subject matter. The instructor gives an explanation and review of diabetes co-morbidities, pre- and post-prandial blood glucose goals, pre-exercise blood glucose goals,  signs, symptoms, and treatment of hypoglycemia and hyperglycemia, and foot care basics.   Diabetes Blitz:  -Group instruction provided by PowerPoint slides, verbal discussion, and written materials to support subject matter. The instructor gives an explanation and review of the physiology behind type 1 and type 2 diabetes, diabetes medications and rational behind using different medications, pre- and post-prandial blood glucose recommendations and Hemoglobin A1c goals, diabetes diet, and exercise including blood glucose guidelines for exercising safely.    Portion Distortion:  -Group instruction provided by PowerPoint slides, verbal discussion, written materials, and food models to support subject matter. The instructor gives an explanation of serving size versus portion size, changes in portions sizes over the last 20 years, and what consists of a serving from each food group.   Stress Management:  -Group instruction provided by verbal instruction, video, and written materials to support subject matter.  Instructors review role of stress in heart disease and how to cope with stress positively.     Exercising on Your Own:  -Group instruction provided by verbal instruction, power point, and written materials to support subject.  Instructors discuss benefits of exercise, components  of exercise, frequency and intensity of exercise, and end points for exercise.  Also discuss use of nitroglycerin and activating EMS.  Review options of places to exercise outside of rehab.  Review guidelines for sex with heart disease.   Cardiac Drugs I:  -Group instruction provided by verbal instruction and written materials to support subject.  Instructor reviews cardiac drug classes: antiplatelets, anticoagulants, beta blockers, and statins.  Instructor discusses reasons, side effects, and lifestyle considerations for each drug class.   Cardiac Drugs II:  -Group instruction provided by verbal instruction and written materials to support subject.  Instructor reviews cardiac drug classes: angiotensin converting enzyme inhibitors (ACE-I), angiotensin II receptor blockers (ARBs), nitrates, and calcium channel blockers.  Instructor discusses reasons, side effects, and lifestyle considerations for each drug class.   Anatomy and Physiology of the Circulatory System:  Group verbal and written instruction and models provide basic cardiac anatomy and physiology, with the coronary electrical and arterial systems. Review of: AMI, Angina, Valve disease, Heart Failure, Peripheral Artery Disease, Cardiac Arrhythmia, Pacemakers, and the ICD.   Other Education:  -Group or individual verbal, written, or video instructions that support the educational goals of the cardiac rehab program.   Holiday Eating Survival Tips:  -Group instruction provided by PowerPoint slides, verbal discussion, and written materials to support subject matter. The instructor gives patients tips, tricks, and techniques to help them not only survive but enjoy the holidays despite the onslaught of food that accompanies the holidays.   Knowledge Questionnaire Score: Knowledge Questionnaire Score - 05/04/18 0954      Knowledge Questionnaire Score   Pre Score  20/24       Core Components/Risk Factors/Patient Goals at  Admission: Personal Goals and Risk Factors at Admission - 05/04/18 0954      Core Components/Risk Factors/Patient Goals on Admission    Weight Management  Weight Gain;Yes    Intervention  Weight Management: Develop a combined nutrition and exercise program designed to reach desired caloric intake, while maintaining appropriate intake of nutrient and fiber, sodium and fats, and appropriate energy expenditure required for the weight goal.;Weight Management: Provide education and appropriate resources to help participant work on and attain dietary goals.    Admit Weight  97 lb 3.6 oz (44.1 kg)    Expected Outcomes  Short Term: Continue to assess and modify interventions until short  term weight is achieved;Long Term: Adherence to nutrition and physical activity/exercise program aimed toward attainment of established weight goal;Understanding recommendations for meals to include 15-35% energy as protein, 25-35% energy from fat, 35-60% energy from carbohydrates, less than 200mg  of dietary cholesterol, 20-35 gm of total fiber daily;Understanding of distribution of calorie intake throughout the day with the consumption of 4-5 meals/snacks;Weight Gain: Understanding of general recommendations for a high calorie, high protein meal plan that promotes weight gain by distributing calorie intake throughout the day with the consumption for 4-5 meals, snacks, and/or supplements    Lipids  Yes    Intervention  Provide education and support for participant on nutrition & aerobic/resistive exercise along with prescribed medications to achieve LDL 70mg , HDL >40mg .    Expected Outcomes  Short Term: Participant states understanding of desired cholesterol values and is compliant with medications prescribed. Participant is following exercise prescription and nutrition guidelines.;Long Term: Cholesterol controlled with medications as prescribed, with individualized exercise RX and with personalized nutrition plan. Value goals:  LDL < 70mg , HDL > 40 mg.    Stress  Yes    Intervention  Offer individual and/or small group education and counseling on adjustment to heart disease, stress management and health-related lifestyle change. Teach and support self-help strategies.;Refer participants experiencing significant psychosocial distress to appropriate mental health specialists for further evaluation and treatment. When possible, include family members and significant others in education/counseling sessions.    Expected Outcomes  Short Term: Participant demonstrates changes in health-related behavior, relaxation and other stress management skills, ability to obtain effective social support, and compliance with psychotropic medications if prescribed.;Long Term: Emotional wellbeing is indicated by absence of clinically significant psychosocial distress or social isolation.       Core Components/Risk Factors/Patient Goals Review:    Core Components/Risk Factors/Patient Goals at Discharge (Final Review):    ITP Comments: ITP Comments    Row Name 05/04/18 0910           ITP Comments  Dr Fransico Him, Medical Director          Comments: Izora Gala attended orientation from [phone] to [phone] to review rules and guidelines for program. Completed 6 minute walk test, Intitial ITP, and exercise prescription.  VSS. Telemetry-Sinus Rhythm, Sinus Tach.  Asymptomatic.Barnet Pall, RN,BSN 05/04/2018 10:39 AM

## 2018-05-04 NOTE — Progress Notes (Signed)
Deborah [name] 72 y.o. female DOB: December 05, 1945 MRN: [id number]      Nutrition Note  1. 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 Arthritis    "qwhere" (02/26/2018)  . Cancer [facility])    "chest" (02/26/2018)  . Graves' disease   . High cholesterol   . Thyroid disease    Meds reviewed.   Current Outpatient Medications (Endocrine & Metabolic):  .  methimazole (TAPAZOLE) 5 MG tablet, Take 5 mg by mouth See admin instructions. Take 5 mg by mouth at bedtime on Mon/Tues/Wed/Thurs/Fri/Sat  Current Outpatient Medications (Cardiovascular):  .  lisinopril (PRINIVIL,ZESTRIL) 2.5 MG tablet, Take 1 tablet (2.5 mg total) by mouth daily. .  metoprolol tartrate (LOPRESSOR) 25 MG tablet, TAKE 1 TABLET BY MOUTH TWICE A DAY .  pravastatin (PRAVACHOL) 40 MG tablet, Take 40 mg by mouth at bedtime.   Current Outpatient Medications (Analgesics):  .  aspirin EC 325 MG EC tablet, Take 1 tablet (325 mg total) by mouth daily.   Current Outpatient Medications (Other):  Marland Kitchen  Calcium Carb-Cholecalciferol (CALCIUM+D3 PO), Take 1 tablet by mouth 2 (two) times daily after a meal. .  Cholecalciferol (VITAMIN D-3) 1000 units CAPS, Take 1,000 Units by mouth at bedtime. .  Multiple Vitamins-Calcium (ONE-A-DAY WOMENS FORMULA) TABS, Take 1 tablet by mouth daily.   HT: Ht Readings from Last 1 Encounters:  04/19/18 5\' 4"  (1.626 m)    WT: Wt Readings from Last 5 Encounters:  04/19/18 98 lb (44.5 kg)  03/23/18 97 lb 6.4 oz (44.2 kg)  03/08/18 97 lb 7.1 oz (44.2 kg)     There is no height or weight on file to calculate BMI.   Current tobacco use? No  Labs:  Lipid Panel     Component Value Date/Time   CHOL 161 02/27/2018 0518   TRIG 45 02/27/2018 0518   HDL 64 02/27/2018 0518   CHOLHDL 2.5 02/27/2018 0518   VLDL 9 02/27/2018 0518   LDLCALC 88 02/27/2018 0518    Lab Results  Component Value Date   HGBA1C 5.4 03/02/2018   CBG (last 3)  No results for input(s): GLUCAP in the last 72  hours.  Nutrition Note Spoke with pt. Nutrition plan and goals reviewed with pt. Pt is following Step 2 of the Therapeutic Lifestyle Changes diet. Pt wants to gain wt. Pt shared during her hospital stay she lost weight, currently about 98 lbs. Pt shared her usual body weight is 103-105 would like to return to that range. Wt gain tips reviewed (label reading, how to build a healthy plate, portion sizes, eating frequently across the day). Educated pt on high protein high calorie foods and encouraged her to add one at each meal. Per discussion, pt does not use canned/convenience foods often. Pt rarely adds salt to food. Pt eats out infrequently.Pt expressed understanding of the information reviewed. Pt aware of nutrition education classes offered and plans on attending nutrition classes.  Nutrition Diagnosis ? Food-and nutrition-related knowledge deficit related to lack of exposure to information as related to diagnosis of: ? CVD   Nutrition Intervention ? Pt's individual nutrition plan and goals reviewed with pt. ? Pt given handouts for: ? Nutrition I class ? Nutrition II class  ? Diabetes Blitz Class ? Diabetes Q & A class  ? Consistent vit K diet ? low sodium ? DM ? pre-diabetes  Nutrition Goal(s):   ? Pt to increase number of meals and snacks eaten daily ?  Pt will identify and increase calorie and high protein food sources   Plan:  ? Will provide client-centered nutrition education as part of interdisciplinary care ? Monitor and evaluate progress toward nutrition goal with team.   Laurina Bustle, MS, RD, LDN 05/04/2018 9:40 AM

## 2018-05-10 ENCOUNTER — Encounter (HOSPITAL_COMMUNITY)
Admission: RE | Admit: 2018-05-10 | Discharge: 2018-05-10 | Disposition: A | Discharge status: Home / Self Care | Payer: Medicare Other | Source: Ambulatory Visit | Attending: Interventional Cardiology | Admitting: Interventional Cardiology

## 2018-05-10 ENCOUNTER — Encounter (HOSPITAL_COMMUNITY): Payer: Medicare Other

## 2018-05-10 DIAGNOSIS — Z951 Presence of aortocoronary bypass graft: Secondary | ICD-10-CM

## 2018-05-10 NOTE — Progress Notes (Signed)
Daily Session Note  Patient Details  Name: STACYANN MCCONAUGHY MRN: [id number] Date of Birth: [birth year] Referring Provider:     CARDIAC REHAB PHASE II ORIENTATION from 05/04/2018 in [place]  Referring Provider  Daneen Schick, MD      Encounter Date: 05/10/2018  Check In: Session Check In - 05/10/18 1012      Check-In   Supervising physician immediately available to respond to emergencies  [facility] immediately available    Physician(s)  Dr. Darrick Meigs    Location  [facility]    Staff Present  Seward Carol, MS, ACSM CEP, Exercise Physiologist;Maria Whitaker, RN, Toma Deiters, RN, Mosie Epstein, MS,ACSM CEP, Exercise Physiologist;Molly DiVincenzo, MS, ACSM RCEP, Exercise Physiologist    Medication changes reported      No    Fall or balance concerns reported     No    Tobacco Cessation  No Change    Warm-up and Cool-down  Performed as group-led instruction    Resistance Training Performed  Yes    VAD Patient?  No    PAD/SET Patient?  No      Pain Assessment   Currently in Pain?  No/denies    Multiple Pain Sites  No       Capillary Blood Glucose: No results found for this or any previous visit (from the past 24 hour(s)).    Social History   Tobacco Use  Smoking Status Former Smoker  . Packs/day: 0.50  . Years: 30.00  . Pack years: 15.00  . Types: Cigarettes  Smokeless Tobacco Never Used  Tobacco Comment   02/26/2018 "nothing since before 2000"    Goals Met:  Exercise tolerated well  Goals Unmet:  Not Applicable  Comments: [place] started cardiac rehab today.  Pt tolerated light exercise without difficulty. VSS, telemetry-Sinus rhtyhm, asymptomatic.  Medication list reconciled. Pt denies barriers to medicaiton compliance.  PSYCHOSOCIAL ASSESSMENT:  PHQ-0. Pt exhibits positive coping skills, hopeful outlook with supportive family. No psychosocial needs identified at this time, no psychosocial interventions  necessary.    Pt has been downsizing her home and staying busy with different activities.   Pt oriented to exercise equipment and routine.    Understanding verbalized.   Dr. Fransico Him is Medical Director for Cardiac Rehab at [facility]

## 2018-05-10 NOTE — Progress Notes (Signed)
Delton See 72 y.o. female Nutrition Note Spoke with pt. Nutrition plan and goals reviewed with pt. Pt is following Step 2 of the Therapeutic Lifestyle Changes diet. Pt wants to gain wt. While in hospital she lost weight (~5-7), but now weighs ~98 lbs. Pt shared her usual body weight is 103-105 would like to return to that range. Reviewed weight gain tips with patient (label reading, how to build a healthy plate, portion sizes, eating frequently across the day). Reeducated pt on high protein high calorie foods and encouraged her to add one at each meal (meals and snacks). Since our last meeting pt has started to eat more meals and snacks across the day. Pt expressed understanding of the information reviewed. Pt aware of nutrition education classes offered and plans on attending nutrition classes.  Lab Results  Component Value Date   HGBA1C 5.4 03/02/2018    Wt Readings from Last 3 Encounters:  05/04/18 97 lb 3.6 oz (44.1 kg)  04/19/18 98 lb (44.5 kg)  03/23/18 97 lb 6.4 oz (44.2 kg)    Nutrition Diagnosis  Food-and nutrition-related knowledge deficit related to lack of exposure to information as related to diagnosis of: ? CVD    Nutrition Intervention ? Pt's individual nutrition plan reviewed with pt. ? Benefits of adopting Heart Healthy diet discussed when Med[employer]icts reviewed.  ? Continue client-centered nutrition education by RD, as part of interdisciplinary care.  Goal(s)  Pt to increase number of meals and snacks eaten daily  Pt will identify and increase calorie and high protein food sources  Plan:   Pt to attend nutrition classes ? Nutrition I ? Nutrition II   Will provide client-centered nutrition education as part of interdisciplinary care  Monitor and evaluate progress toward nutrition goal with team.    Laurina Bustle, MS, RD, LDN 05/10/2018 10:51 AM

## 2018-05-12 ENCOUNTER — Encounter (HOSPITAL_COMMUNITY)
Admission: RE | Admit: 2018-05-12 | Discharge: 2018-05-12 | Disposition: A | Discharge status: Home / Self Care | Payer: Medicare Other | Source: Ambulatory Visit | Attending: Interventional Cardiology | Admitting: Interventional Cardiology

## 2018-05-12 ENCOUNTER — Encounter (HOSPITAL_COMMUNITY): Payer: Medicare Other

## 2018-05-12 DIAGNOSIS — M1990 Unspecified osteoarthritis, unspecified site: Secondary | ICD-10-CM | POA: Diagnosis not present

## 2018-05-12 DIAGNOSIS — Z951 Presence of aortocoronary bypass graft: Secondary | ICD-10-CM | POA: Diagnosis not present

## 2018-05-12 DIAGNOSIS — Z7982 Long term (current) use of aspirin: Secondary | ICD-10-CM | POA: Diagnosis not present

## 2018-05-12 DIAGNOSIS — Z79899 Other long term (current) drug therapy: Secondary | ICD-10-CM | POA: Diagnosis not present

## 2018-05-12 DIAGNOSIS — E079 Disorder of thyroid, unspecified: Secondary | ICD-10-CM | POA: Diagnosis not present

## 2018-05-12 DIAGNOSIS — E7800 Pure hypercholesterolemia, unspecified: Secondary | ICD-10-CM | POA: Diagnosis not present

## 2018-05-14 ENCOUNTER — Encounter (HOSPITAL_COMMUNITY): Payer: Medicare Other

## 2018-05-17 ENCOUNTER — Encounter (HOSPITAL_COMMUNITY)
Admission: RE | Admit: 2018-05-17 | Discharge: 2018-05-17 | Disposition: A | Discharge status: Home / Self Care | Payer: Medicare Other | Source: Ambulatory Visit | Attending: Interventional Cardiology | Admitting: Interventional Cardiology

## 2018-05-17 ENCOUNTER — Encounter (HOSPITAL_COMMUNITY): Payer: Medicare Other

## 2018-05-17 DIAGNOSIS — M1990 Unspecified osteoarthritis, unspecified site: Secondary | ICD-10-CM | POA: Diagnosis not present

## 2018-05-17 DIAGNOSIS — E7800 Pure hypercholesterolemia, unspecified: Secondary | ICD-10-CM | POA: Diagnosis not present

## 2018-05-17 DIAGNOSIS — Z79899 Other long term (current) drug therapy: Secondary | ICD-10-CM | POA: Diagnosis not present

## 2018-05-17 DIAGNOSIS — Z951 Presence of aortocoronary bypass graft: Secondary | ICD-10-CM | POA: Diagnosis not present

## 2018-05-17 DIAGNOSIS — E079 Disorder of thyroid, unspecified: Secondary | ICD-10-CM | POA: Diagnosis not present

## 2018-05-17 DIAGNOSIS — Z7982 Long term (current) use of aspirin: Secondary | ICD-10-CM | POA: Diagnosis not present

## 2018-05-19 ENCOUNTER — Encounter (HOSPITAL_COMMUNITY): Payer: Medicare Other

## 2018-05-19 ENCOUNTER — Encounter (HOSPITAL_COMMUNITY)
Admission: RE | Admit: 2018-05-19 | Discharge: 2018-05-19 | Disposition: A | Discharge status: Home / Self Care | Payer: Medicare Other | Source: Ambulatory Visit | Attending: Interventional Cardiology | Admitting: Interventional Cardiology

## 2018-05-19 DIAGNOSIS — M1990 Unspecified osteoarthritis, unspecified site: Secondary | ICD-10-CM | POA: Diagnosis not present

## 2018-05-19 DIAGNOSIS — E079 Disorder of thyroid, unspecified: Secondary | ICD-10-CM | POA: Diagnosis not present

## 2018-05-19 DIAGNOSIS — E7800 Pure hypercholesterolemia, unspecified: Secondary | ICD-10-CM | POA: Diagnosis not present

## 2018-05-19 DIAGNOSIS — Z79899 Other long term (current) drug therapy: Secondary | ICD-10-CM | POA: Diagnosis not present

## 2018-05-19 DIAGNOSIS — Z7982 Long term (current) use of aspirin: Secondary | ICD-10-CM | POA: Diagnosis not present

## 2018-05-19 DIAGNOSIS — Z951 Presence of aortocoronary bypass graft: Secondary | ICD-10-CM | POA: Diagnosis not present

## 2018-05-19 NOTE — Progress Notes (Signed)
Reviewed home exercise guidelines with patient including endpoints, temperature precautions, target heart rate and rate of perceived exertion. Pt walks regularly and is currently participating in a balance class at [facility] on Tuesdays and Thursdays as her mode of home exercise. Pt voices understanding of instructions given. Sol Passer, MS, ACSM CEP

## 2018-05-21 ENCOUNTER — Encounter (HOSPITAL_COMMUNITY)
Admission: RE | Admit: 2018-05-21 | Discharge: 2018-05-21 | Disposition: A | Discharge status: Home / Self Care | Payer: Medicare Other | Source: Ambulatory Visit | Attending: Interventional Cardiology | Admitting: Interventional Cardiology

## 2018-05-21 ENCOUNTER — Encounter (HOSPITAL_COMMUNITY): Payer: Medicare Other

## 2018-05-21 DIAGNOSIS — Z951 Presence of aortocoronary bypass graft: Secondary | ICD-10-CM

## 2018-05-24 ENCOUNTER — Encounter (HOSPITAL_COMMUNITY): Payer: Medicare Other

## 2018-05-24 ENCOUNTER — Encounter (HOSPITAL_COMMUNITY)
Admission: RE | Admit: 2018-05-24 | Discharge: 2018-05-24 | Disposition: A | Discharge status: Home / Self Care | Payer: Medicare Other | Source: Ambulatory Visit | Attending: Interventional Cardiology | Admitting: Interventional Cardiology

## 2018-05-24 DIAGNOSIS — Z79899 Other long term (current) drug therapy: Secondary | ICD-10-CM | POA: Diagnosis not present

## 2018-05-24 DIAGNOSIS — Z951 Presence of aortocoronary bypass graft: Secondary | ICD-10-CM

## 2018-05-24 DIAGNOSIS — E7800 Pure hypercholesterolemia, unspecified: Secondary | ICD-10-CM | POA: Diagnosis not present

## 2018-05-24 DIAGNOSIS — M1990 Unspecified osteoarthritis, unspecified site: Secondary | ICD-10-CM | POA: Diagnosis not present

## 2018-05-24 DIAGNOSIS — Z7982 Long term (current) use of aspirin: Secondary | ICD-10-CM | POA: Diagnosis not present

## 2018-05-24 DIAGNOSIS — E079 Disorder of thyroid, unspecified: Secondary | ICD-10-CM | POA: Diagnosis not present

## 2018-05-26 ENCOUNTER — Encounter (HOSPITAL_COMMUNITY)
Admission: RE | Admit: 2018-05-26 | Discharge: 2018-05-26 | Disposition: A | Discharge status: Home / Self Care | Payer: Medicare Other | Source: Ambulatory Visit | Attending: Interventional Cardiology | Admitting: Interventional Cardiology

## 2018-05-26 ENCOUNTER — Encounter (HOSPITAL_COMMUNITY): Payer: Medicare Other

## 2018-05-26 DIAGNOSIS — Z79899 Other long term (current) drug therapy: Secondary | ICD-10-CM | POA: Diagnosis not present

## 2018-05-26 DIAGNOSIS — Z951 Presence of aortocoronary bypass graft: Secondary | ICD-10-CM | POA: Diagnosis not present

## 2018-05-26 DIAGNOSIS — Z961 Presence of intraocular lens: Secondary | ICD-10-CM | POA: Diagnosis not present

## 2018-05-26 DIAGNOSIS — H52223 Regular astigmatism, bilateral: Secondary | ICD-10-CM | POA: Diagnosis not present

## 2018-05-26 DIAGNOSIS — M1990 Unspecified osteoarthritis, unspecified site: Secondary | ICD-10-CM | POA: Diagnosis not present

## 2018-05-26 DIAGNOSIS — H5203 Hypermetropia, bilateral: Secondary | ICD-10-CM | POA: Diagnosis not present

## 2018-05-26 DIAGNOSIS — E7800 Pure hypercholesterolemia, unspecified: Secondary | ICD-10-CM | POA: Diagnosis not present

## 2018-05-26 DIAGNOSIS — H3540 Unspecified peripheral retinal degeneration: Secondary | ICD-10-CM | POA: Diagnosis not present

## 2018-05-26 DIAGNOSIS — Z7982 Long term (current) use of aspirin: Secondary | ICD-10-CM | POA: Diagnosis not present

## 2018-05-26 DIAGNOSIS — E079 Disorder of thyroid, unspecified: Secondary | ICD-10-CM | POA: Diagnosis not present

## 2018-05-26 DIAGNOSIS — H524 Presbyopia: Secondary | ICD-10-CM | POA: Diagnosis not present

## 2018-05-28 ENCOUNTER — Encounter (HOSPITAL_COMMUNITY)
Admission: RE | Admit: 2018-05-28 | Discharge: 2018-05-28 | Disposition: A | Discharge status: Home / Self Care | Payer: Medicare Other | Source: Ambulatory Visit | Attending: Interventional Cardiology | Admitting: Interventional Cardiology

## 2018-05-28 ENCOUNTER — Encounter (HOSPITAL_COMMUNITY): Payer: Medicare Other

## 2018-05-28 DIAGNOSIS — Z7982 Long term (current) use of aspirin: Secondary | ICD-10-CM | POA: Diagnosis not present

## 2018-05-28 DIAGNOSIS — E079 Disorder of thyroid, unspecified: Secondary | ICD-10-CM | POA: Diagnosis not present

## 2018-05-28 DIAGNOSIS — M1990 Unspecified osteoarthritis, unspecified site: Secondary | ICD-10-CM | POA: Diagnosis not present

## 2018-05-28 DIAGNOSIS — Z951 Presence of aortocoronary bypass graft: Secondary | ICD-10-CM

## 2018-05-28 DIAGNOSIS — Z79899 Other long term (current) drug therapy: Secondary | ICD-10-CM | POA: Diagnosis not present

## 2018-05-28 DIAGNOSIS — E7800 Pure hypercholesterolemia, unspecified: Secondary | ICD-10-CM | POA: Diagnosis not present

## 2018-05-31 ENCOUNTER — Encounter (HOSPITAL_COMMUNITY)
Admission: RE | Admit: 2018-05-31 | Discharge: 2018-05-31 | Disposition: A | Discharge status: Home / Self Care | Payer: Medicare Other | Source: Ambulatory Visit | Attending: Interventional Cardiology | Admitting: Interventional Cardiology

## 2018-05-31 ENCOUNTER — Encounter (HOSPITAL_COMMUNITY): Payer: Medicare Other

## 2018-05-31 DIAGNOSIS — E079 Disorder of thyroid, unspecified: Secondary | ICD-10-CM | POA: Diagnosis not present

## 2018-05-31 DIAGNOSIS — Z79899 Other long term (current) drug therapy: Secondary | ICD-10-CM | POA: Diagnosis not present

## 2018-05-31 DIAGNOSIS — Z951 Presence of aortocoronary bypass graft: Secondary | ICD-10-CM | POA: Diagnosis not present

## 2018-05-31 DIAGNOSIS — Z7982 Long term (current) use of aspirin: Secondary | ICD-10-CM | POA: Diagnosis not present

## 2018-05-31 DIAGNOSIS — E7800 Pure hypercholesterolemia, unspecified: Secondary | ICD-10-CM | POA: Diagnosis not present

## 2018-05-31 DIAGNOSIS — M1990 Unspecified osteoarthritis, unspecified site: Secondary | ICD-10-CM | POA: Diagnosis not present

## 2018-06-02 ENCOUNTER — Encounter (HOSPITAL_COMMUNITY)
Admission: RE | Admit: 2018-06-02 | Discharge: 2018-06-02 | Disposition: A | Discharge status: Home / Self Care | Payer: Medicare Other | Source: Ambulatory Visit | Attending: Interventional Cardiology | Admitting: Interventional Cardiology

## 2018-06-02 ENCOUNTER — Encounter (HOSPITAL_COMMUNITY): Payer: Medicare Other

## 2018-06-02 DIAGNOSIS — E079 Disorder of thyroid, unspecified: Secondary | ICD-10-CM | POA: Insufficient documentation

## 2018-06-02 DIAGNOSIS — Z7982 Long term (current) use of aspirin: Secondary | ICD-10-CM | POA: Diagnosis not present

## 2018-06-02 DIAGNOSIS — Z79899 Other long term (current) drug therapy: Secondary | ICD-10-CM | POA: Diagnosis not present

## 2018-06-02 DIAGNOSIS — M1990 Unspecified osteoarthritis, unspecified site: Secondary | ICD-10-CM | POA: Insufficient documentation

## 2018-06-02 DIAGNOSIS — Z951 Presence of aortocoronary bypass graft: Secondary | ICD-10-CM | POA: Diagnosis not present

## 2018-06-02 DIAGNOSIS — E7800 Pure hypercholesterolemia, unspecified: Secondary | ICD-10-CM | POA: Diagnosis not present

## 2018-06-03 NOTE — Progress Notes (Signed)
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  aspirin EC 325 MG EC tablet, Take 1 tablet (325 mg total) by mouth daily., Disp: 30 tablet, Rfl: 0 .  Calcium Carb-Cholecalciferol (CALCIUM+D3 PO), Take 1 tablet by mouth 2 (two) times daily after a meal., Disp: , Rfl:  .  Cholecalciferol (VITAMIN D-3) 1000 units CAPS, Take 1,000 Units by mouth at bedtime., Disp: , Rfl:  .  lisinopril (PRINIVIL,ZESTRIL) 2.5 MG tablet, Take 1 tablet (2.5 mg total) by mouth daily., Disp: 30 tablet, Rfl: 1 .  methimazole (TAPAZOLE) 5 MG tablet, Take 5 mg by mouth See admin instructions. Take 5 mg by mouth at bedtime on Mon/Tues/Wed/Thurs/Fri/Sat, Disp: , Rfl: 3 .  metoprolol tartrate (LOPRESSOR) 25 MG tablet, TAKE 1 TABLET BY MOUTH TWICE A DAY, Disp: 60 tablet, Rfl: 1 .  Multiple Vitamins-Calcium (ONE-A-DAY WOMENS FORMULA) TABS, Take 1 tablet by mouth daily., Disp: , Rfl:  .  pravastatin (PRAVACHOL) 40 MG tablet, Take 40 mg by mouth at bedtime., Disp: , Rfl:   Past Medical History: Past Medical History:  Diagnosis Date  . Arthritis    "qwhere" (02/26/2018)  . Cancer [facility])    "chest" (02/26/2018)  . Coronary artery disease   . Graves' disease   . High cholesterol   . Thyroid disease     Tobacco Use: Social History   Tobacco Use  Smoking Status Former Smoker  . Packs/day: 0.50  . Years: 30.00  . Pack years: 15.00  . Types: Cigarettes  Smokeless Tobacco Never Used  Tobacco Comment   02/26/2018 "nothing since  before 2000"    Labs: Recent Review Flowsheet Data    Labs for ITP Cardiac and Pulmonary Rehab Latest Ref Rng & Units 03/02/2018 03/02/2018 03/02/2018 03/02/2018 03/03/2018   Cholestrol 0 - 200 mg/dL - - - - -   LDLCALC 0 - 99 mg/dL - - - - -   HDL >40 mg/dL - - - - -   Trlycerides <150 mg/dL - - - - -   Hemoglobin A1c 4.8 - 5.6 % - - - - -   PHART 7.350 - 7.450 7.380 7.321(L) 7.313(L) - -   PCO2ART 32.0 - 48.0 mmHg 30.5(L) 39.4 41.4 - -   HCO3 20.0 - 28.0 mmol/L 18.3(L) 20.4 20.9 - -   TCO2 22 - 32 mmol/L 19(L) 22 22 21(L) 22   ACIDBASEDEF 0.0 - 2.0 mmol/L 6.0(H) 5.0(H) 5.0(H) - -   O2SAT % 99.0 99.0 98.0 - -      Capillary Blood Glucose: Lab Results  Component Value Date   GLUCAP 95 03/04/2018   GLUCAP 105 (H) 03/04/2018   GLUCAP 87 03/04/2018   GLUCAP 94 03/03/2018   GLUCAP 93 03/03/2018     Exercise Target Goals: Exercise Program Goal: Individual exercise prescription set using results from initial 6 min walk test and THRR while considering  patient's activity barriers and safety.   Exercise Prescription Goal: Initial exercise prescription builds to 30-45 minutes  a day of aerobic activity, 2-3 days per week.  Home exercise guidelines will be given to patient during program as part of exercise prescription that the participant will acknowledge.  Activity Barriers & Risk Stratification: Activity Barriers & Cardiac Risk Stratification - 05/04/18 0851      Activity Barriers & Cardiac Risk Stratification   Activity Barriers  Other (comment)    Comments  Left knee ACL replacement. Bilateral shoulder repair.    Cardiac Risk Stratification  High       6 Minute Walk: 6 Minute Walk    Row Name 05/04/18 0840         6 Minute Walk   Phase  Initial     Distance  1866 feet     Walk Time  6 minutes     # of Rest Breaks  0     MPH  3.53     METS  4.09     RPE  11     VO2 Peak  14.31     Symptoms  No     Resting HR  65 bpm     Resting BP  104/60     Resting Oxygen  Saturation   98 %     Exercise Oxygen Saturation  during 6 min walk  99 %     Max Ex. HR  93 bpm     Max Ex. BP  124/72     2 Minute Post BP  122/62        Oxygen Initial Assessment:   Oxygen Re-Evaluation:   Oxygen Discharge (Final Oxygen Re-Evaluation):   Initial Exercise Prescription: Initial Exercise Prescription - 05/04/18 0900      Date of Initial Exercise RX and Referring Provider   Date  05/04/18    Referring Provider  Daneen Schick, MD      Treadmill   MPH  2.5    Grade  1    Minutes  10    METs  3.26      Bike   Level  0.7    Minutes  10    METs  3.94      NuStep   Level  4    SPM  85    Minutes  10    METs  3      Prescription Details   Frequency (times per week)  3    Duration  Progress to 30 minutes of continuous aerobic without signs/symptoms of physical distress      Intensity   THRR 40-80% of Max Heartrate  59-118    Ratings of Perceived Exertion  11-13    Perceived Dyspnea  0-4      Progression   Progression  Continue to progress workloads to maintain intensity without signs/symptoms of physical distress.      Resistance Training   Training Prescription  Yes    Weight  3lbs    Reps  10-15       Perform Capillary Blood Glucose checks as needed.  Exercise Prescription Changes: Exercise Prescription Changes    Row Name 05/10/18 0947 05/26/18 0831           Response to Exercise   Blood Pressure (Admit)  112/60  112/70      Blood Pressure (Exercise)  132/62  116/60      Blood Pressure (Exit)  114/62  98/60      Heart Rate (Admit)  64 bpm  87 bpm      Heart Rate (  Exercise)  95 bpm  93 bpm      Heart Rate (Exit)  62 bpm  63 bpm      Rating of Perceived Exertion (Exercise)  12  11      Symptoms  none  none      Duration  Progress to 30 minutes of  aerobic without signs/symptoms of physical distress  Progress to 30 minutes of  aerobic without signs/symptoms of physical distress      Intensity  THRR unchanged  THRR unchanged         Progression   Progression  Continue to progress workloads to maintain intensity without signs/symptoms of physical distress.  Continue to progress workloads to maintain intensity without signs/symptoms of physical distress.      Average METs  3.7  4.1        Resistance Training   Training Prescription  Yes  No Relaxation day, no weights      Weight  3lbs  -      Reps  10-15  -      Time  10 Minutes  -        Interval Training   Interval Training  No  No        Treadmill   MPH  2.5  2.6      Grade  1  1      Minutes  10  10      METs  3.26  3.35        Bike   Level  1  1      Minutes  10  10      METs  5.24  5.31        NuStep   Level  4  4      SPM  85  85      Minutes  10  10      METs  2.6  3.7        Home Exercise Plan   Plans to continue exercise at  [facility] (comment)      Frequency  -  Add 3 additional days to program exercise sessions.      Initial Home Exercises Provided  -  05/19/18         Exercise Comments: Exercise Comments    Row Name 05/10/18 1044 05/19/18 1009 05/26/18 0847       Exercise Comments  Patient tolerated first session of exercise well.  Reviewed home exercise guidelines and goals with patient.  Reviewed METs and goals with patient.        Exercise Goals and Review: Exercise Goals    Row Name 05/04/18 0853             Exercise Goals   Increase Physical Activity  Yes       Intervention  Provide advice, education, support and counseling about physical activity/exercise needs.;Develop an individualized exercise prescription for aerobic and resistive training based on initial evaluation findings, risk stratification, comorbidities and participant's personal goals.       Expected Outcomes  Short Term: Attend rehab on a regular basis to increase amount of physical activity.;Long Term: Exercising regularly at least 3-5 days a week.;Long Term: Add in home exercise to make exercise part of routine and to increase amount of  physical activity.       Increase Strength and Stamina  Yes       Intervention  Provide advice, education, support and counseling about physical activity/exercise  needs.;Develop an individualized exercise prescription for aerobic and resistive training based on initial evaluation findings, risk stratification, comorbidities and participant's personal goals.       Expected Outcomes  Short Term: Increase workloads from initial exercise prescription for resistance, speed, and METs.;Short Term: Perform resistance training exercises routinely during rehab and add in resistance training at home;Long Term: Improve cardiorespiratory fitness, muscular endurance and strength as measured by increased METs and functional capacity (6MWT)       Able to understand and use rate of perceived exertion (RPE) scale  Yes       Intervention  Provide education and explanation on how to use RPE scale       Expected Outcomes  Short Term: Able to use RPE daily in rehab to express subjective intensity level;Long Term:  Able to use RPE to guide intensity level when exercising independently       Knowledge and understanding of Target Heart Rate Range (THRR)  Yes       Intervention  Provide education and explanation of THRR including how the numbers were predicted and where they are located for reference       Expected Outcomes  Short Term: Able to state/look up THRR;Long Term: Able to use THRR to govern intensity when exercising independently;Short Term: Able to use daily as guideline for intensity in rehab       Able to check pulse independently  Yes       Intervention  Provide education and demonstration on how to check pulse in carotid and radial arteries.;Review the importance of being able to check your own pulse for safety during independent exercise       Expected Outcomes  Short Term: Able to explain why pulse checking is important during independent exercise;Long Term: Able to check pulse independently and accurately        Understanding of Exercise Prescription  Yes       Intervention  Provide education, explanation, and written materials on patient's individual exercise prescription       Expected Outcomes  Short Term: Able to explain program exercise prescription;Long Term: Able to explain home exercise prescription to exercise independently          Exercise Goals Re-Evaluation : Exercise Goals Re-Evaluation    Row Name 05/10/18 1044 05/19/18 1009 05/26/18 0847         Exercise Goal Re-Evaluation   Exercise Goals Review  Able to understand and use rate of perceived exertion (RPE) scale;Increase Physical Activity  Able to understand and use rate of perceived exertion (RPE) scale;Understanding of Exercise Prescription;Knowledge and understanding of Target Heart Rate Range (THRR)  Able to understand and use rate of perceived exertion (RPE) scale;Understanding of Exercise Prescription;Knowledge and understanding of Target Heart Rate Range (THRR);Increase Physical Activity     Comments  Patient able to understand and use RPE scale appropriately.  Reviewed home exercise guidelines with patient including THRR, RPE scale and endpoints for exercise. Pt is currently participating in a blance class at [facility] on Tuesdays and Thursdays in addition to exercise at [facility]. Pt is also walking for her exericse.  Patinet is walking about 30 minutes daily and pariticpates in group fitness classes 3 days/week at [facility]. Pt also has access to fitness center at [facility].     Expected Outcomes  Increase workloads as tolerated to help achieve personal health and fitness goals.  Patient will continue exercise at least 5 days/week to help improve cardiorespiratory fitness.  Patient will continue current  exercise routine, increasing workloads as tolerated to help achieve personal health and fitness goals.        Discharge Exercise Prescription (Final Exercise Prescription Changes): Exercise Prescription Changes - 05/26/18 0831       Response to Exercise   Blood Pressure (Admit)  112/70    Blood Pressure (Exercise)  116/60    Blood Pressure (Exit)  98/60    Heart Rate (Admit)  87 bpm    Heart Rate (Exercise)  93 bpm    Heart Rate (Exit)  63 bpm    Rating of Perceived Exertion (Exercise)  11    Symptoms  none    Duration  Progress to 30 minutes of  aerobic without signs/symptoms of physical distress    Intensity  THRR unchanged      Progression   Progression  Continue to progress workloads to maintain intensity without signs/symptoms of physical distress.    Average METs  4.1      Resistance Training   Training Prescription  No   Relaxation day, no weights     Interval Training   Interval Training  No      Treadmill   MPH  2.6    Grade  1    Minutes  10    METs  3.35      Bike   Level  1    Minutes  10    METs  5.31      NuStep   Level  4    SPM  85    Minutes  10    METs  3.7      Home Exercise Plan   Plans to continue exercise at  [employer] [employer] (comment)    Frequency  Add 3 additional days to program exercise sessions.    Initial Home Exercises Provided  05/19/18       Nutrition:  Target Goals: Understanding of nutrition guidelines, daily intake of sodium 1500mg , cholesterol 200mg , calories 30% from fat and 7% or less from saturated fats, daily to have 5 or more servings of fruits and vegetables.  Biometrics: Pre Biometrics - 05/04/18 0803      Pre Biometrics   Height  5' 3.5" (1.613 m)    Weight  44.1 kg    Waist Circumference  24 inches    Hip Circumference  32.25 inches    Waist to Hip Ratio  0.74 %    BMI (Calculated)  16.95    Triceps Skinfold  8 mm    % Body Fat  23.7 %    Grip Strength  24 kg    Flexibility  12 in    Single Leg Stand  30 seconds        Nutrition Therapy Plan and Nutrition Goals: Nutrition Therapy & Goals - 05/04/18 0939      Nutrition Therapy   Diet  heart healthy      Personal Nutrition Goals   Nutrition Goal  pt will increase the  number of meals and snacks consumed daily      Intervention Plan   Intervention  Prescribe, educate and counsel regarding individualized specific dietary modifications aiming towards targeted core components such as weight, hypertension, lipid management, diabetes, heart failure and other comorbidities.    Expected Outcomes  Short Term Goal: Understand basic principles of dietary content, such as calories, fat, sodium, cholesterol and nutrients.       Nutrition Assessments: Nutrition Assessments - 05/04/18 0940      MEDFICTS Scores  Pre Score  36       Nutrition Goals Re-Evaluation:   Nutrition Goals Re-Evaluation:   Nutrition Goals Discharge (Final Nutrition Goals Re-Evaluation):   Psychosocial: Target Goals: Acknowledge presence or absence of significant depression and/or stress, maximize coping skills, provide positive support system. Participant is able to verbalize types and ability to use techniques and skills needed for reducing stress and depression.  Initial Review & Psychosocial Screening: Initial Psych Review & Screening - 05/04/18 1018      Initial Review   Current issues with  Current Stress Concerns    Source of Stress Concerns  --   Recently downsized and moved   Comments  Husband is currently receiving cancer treatment      [place]?  Yes   Miles has her husband and sister for support     Barriers   Psychosocial barriers to participate in program  The patient should benefit from training in stress management and relaxation.      Screening Interventions   Interventions  Encouraged to exercise    Expected Outcomes  Long Term Goal: Stressors or current issues are controlled or eliminated.       Quality of Life Scores: Quality of Life - 05/04/18 0953      Quality of Life   Select  Quality of Life      Quality of Life Scores   Health/Function Pre  25.07 %    Socioeconomic Pre  27.14 %    Psych/Spiritual Pre  28.57 %     Family Pre  28.5 %    GLOBAL Pre  26.67 %      Scores of 19 and below usually indicate a poorer quality of life in these areas.  A difference of  2-3 points is a clinically meaningful difference.  A difference of 2-3 points in the total score of the Quality of Life Index has been associated with significant improvement in overall quality of life, self-image, physical symptoms, and general health in studies assessing change in quality of life.  PHQ-9: Recent Review Flowsheet Data    Depression screen [facility] 2/9 05/10/2018   Decreased Interest 0   Down, Depressed, Hopeless 0   PHQ - 2 Score 0     Interpretation of Total Score  Total Score Depression Severity:  1-4 = Minimal depression, 5-9 = Mild depression, 10-14 = Moderate depression, 15-19 = Moderately severe depression, 20-27 = Severe depression   Psychosocial Evaluation and Intervention:   Psychosocial Re-Evaluation: Psychosocial Re-Evaluation    R[place] Name 06/03/18 1144             Psychosocial Re-Evaluation   Current issues with  Current Stress Concerns       Comments  Samanvitha has not voiced any increased stressors at this time will continue to monitor.       Interventions  Stress management education;Encouraged to attend Cardiac Rehabilitation for the exercise       Comments  Husband is currently receiving cancer treatment         Initial Review   Source of Stress Concerns  Family          Psychosocial Discharge (Final Psychosocial Re-Evaluation): Psychosocial Re-Evaluation - 06/03/18 1144      Psychosocial Re-Evaluation   Current issues with  Current Stress Concerns    Comments  Yailine has not voiced any increased stressors at this time will continue to monitor.    Interventions  Stress management education;Encouraged to attend  Cardiac Rehabilitation for the exercise    Comments  Husband is currently receiving cancer treatment      Initial Review   Source of Stress Concerns  Family       Vocational  Rehabilitation: Provide vocational rehab assistance to qualifying candidates.   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 Participant will state understanding/return demonstration of topics presented.  Learning Barriers/Preferences: Learning Barriers/Preferences - 05/04/18 0910      Learning Barriers/Preferences   Learning Barriers  None    Learning Preferences  None       Education Topics: Count Your Pulse:  -Group instruction provided by verbal instruction, demonstration, patient participation and written materials to support subject.  Instructors address importance of being able to find your pulse and how to count your pulse when at home without a heart monitor.  Patients get hands on experience counting their pulse with staff help and individually.   Heart Attack, Angina, and Risk Factor Modification:  -Group instruction provided by verbal instruction, video, and written materials to support subject.  Instructors address signs and symptoms of angina and heart attacks.    Also discuss risk factors for heart disease and how to make changes to improve heart health risk factors.   CARDIAC REHAB PHASE II EXERCISE from 06/02/2018 in [place]  Date  06/02/18  Instruction Review Code  2- Demonstrated Understanding      Functional Fitness:  -Group instruction provided by verbal instruction, demonstration, patient participation, and written materials to support subject.  Instructors address safety measures for doing things around the house.  Discuss how to get up and down off the floor, how to pick things up properly, how to  safely get out of a chair without assistance, and balance training.   Meditation and Mindfulness:  -Group instruction provided by verbal instruction, patient participation, and written materials to support subject.  Instructor addresses importance of mindfulness and meditation practice to help reduce stress and improve awareness.  Instructor also leads participants through a meditation exercise.    Stretching for Flexibility and Mobility:  -Group instruction provided by verbal instruction, patient participation, and written materials to support subject.  Instructors lead participants through series of stretches that are designed to increase flexibility thus improving mobility.  These stretches are additional exercise for major muscle groups that are typically performed during regular warm up and cool down.   Hands Only CPR:  -Group verbal, video, and participation provides a basic overview of AHA guidelines for community CPR. Role-play of emergencies allow participants the opportunity to practice calling for help and chest compression technique with discussion of AED use.   Hypertension: -Group verbal and written instruction that provides a basic overview of hypertension including the most recent diagnostic guidelines, risk factor reduction with self-care instructions and medication management.    Nutrition I class: Heart Healthy Eating:  -Group instruction provided by PowerPoint slides, verbal discussion, and written materials to support subject matter. The instructor gives an explanation and review of the Therapeutic Lifestyle Changes diet recommendations, which includes a discussion on lipid goals, dietary fat, sodium, fiber, plant stanol/sterol esters, sugar, and the components of  a well-balanced, healthy diet.   Nutrition II class: Lifestyle Skills:  -Group instruction provided by PowerPoint slides, verbal discussion, and written materials to support subject matter. The instructor gives  an explanation and review of label reading, grocery shopping for heart health, heart healthy recipe modifications, and ways to make healthier choices when eating out.   Diabetes Question & Answer:  -Group instruction provided by PowerPoint slides, verbal discussion, and written materials to support subject matter. The instructor gives an explanation and review of diabetes co-morbidities, pre- and post-prandial blood glucose goals, pre-exercise blood glucose goals, signs, symptoms, and treatment of hypoglycemia and hyperglycemia, and foot care basics.   Diabetes Blitz:  -Group instruction provided by PowerPoint slides, verbal discussion, and written materials to support subject matter. The instructor gives an explanation and review of the physiology behind type 1 and type 2 diabetes, diabetes medications and rational behind using different medications, pre- and post-prandial blood glucose recommendations and Hemoglobin A1c goals, diabetes diet, and exercise including blood glucose guidelines for exercising safely.    Portion Distortion:  -Group instruction provided by PowerPoint slides, verbal discussion, written materials, and food models to support subject matter. The instructor gives an explanation of serving size versus portion size, changes in portions sizes over the last 20 years, and what consists of a serving from each food group.   Stress Management:  -Group instruction provided by verbal instruction, video, and written materials to support subject matter.  Instructors review role of stress in heart disease and how to cope with stress positively.     Exercising on Your Own:  -Group instruction provided by verbal instruction, power point, and written materials to support subject.  Instructors discuss benefits of exercise, components of exercise, frequency and intensity of exercise, and end points for exercise.  Also discuss use of nitroglycerin and activating EMS.  Review options of places  to exercise outside of rehab.  Review guidelines for sex with heart disease.   Cardiac Drugs I:  -Group instruction provided by verbal instruction and written materials to support subject.  Instructor reviews cardiac drug classes: antiplatelets, anticoagulants, beta blockers, and statins.  Instructor discusses reasons, side effects, and lifestyle considerations for each drug class.   CARDIAC REHAB PHASE II EXERCISE from 06/02/2018 in [place]  Date  05/12/18  Instruction Review Code  2- Demonstrated Understanding      Cardiac Drugs II:  -Group instruction provided by verbal instruction and written materials to support subject.  Instructor reviews cardiac drug classes: angiotensin converting enzyme inhibitors (ACE-I), angiotensin II receptor blockers (ARBs), nitrates, and calcium channel blockers.  Instructor discusses reasons, side effects, and lifestyle considerations for each drug class.   Anatomy and Physiology of the Circulatory System:  Group verbal and written instruction and models provide basic cardiac anatomy and physiology, with the coronary electrical and arterial systems. Review of: AMI, Angina, Valve disease, Heart Failure, Peripheral Artery Disease, Cardiac Arrhythmia, Pacemakers, and the ICD.   CARDIAC REHAB PHASE II EXERCISE from 06/02/2018 in [place]  Date  05/26/18  Educator  RN  Instruction Review Code  2- Demonstrated Understanding      Other Education:  -Group or individual verbal, written, or video instructions that support the educational goals of the cardiac rehab program.   Holiday Eating Survival Tips:  -Group instruction provided by PowerPoint slides, verbal discussion, and written materials to support subject matter. The instructor gives patients tips, tricks, and techniques to help them not only  survive but enjoy the holidays despite the onslaught of food that accompanies the  holidays.   Knowledge Questionnaire Score: Knowledge Questionnaire Score - 05/04/18 0954      Knowledge Questionnaire Score   Pre Score  20/24       Core Components/Risk Factors/Patient Goals at Admission: Personal Goals and Risk Factors at Admission - 05/04/18 0954      Core Components/Risk Factors/Patient Goals on Admission    Weight Management  Weight Gain;Yes    Intervention  Weight Management: Develop a combined nutrition and exercise program designed to reach desired caloric intake, while maintaining appropriate intake of nutrient and fiber, sodium and fats, and appropriate energy expenditure required for the weight goal.;Weight Management: Provide education and appropriate resources to help participant work on and attain dietary goals.    Admit Weight  97 lb 3.6 oz (44.1 kg)    Expected Outcomes  Short Term: Continue to assess and modify interventions until short term weight is achieved;Long Term: Adherence to nutrition and physical activity/exercise program aimed toward attainment of established weight goal;Understanding recommendations for meals to include 15-35% energy as protein, 25-35% energy from fat, 35-60% energy from carbohydrates, less than 200mg  of dietary cholesterol, 20-35 gm of total fiber daily;Understanding of distribution of calorie intake throughout the day with the consumption of 4-5 meals/snacks;Weight Gain: Understanding of general recommendations for a high calorie, high protein meal plan that promotes weight gain by distributing calorie intake throughout the day with the consumption for 4-5 meals, snacks, and/or supplements    Lipids  Yes    Intervention  Provide education and support for participant on nutrition & aerobic/resistive exercise along with prescribed medications to achieve LDL 70mg , HDL >40mg .    Expected Outcomes  Short Term: Participant states understanding of desired cholesterol values and is compliant with medications prescribed. Participant is  following exercise prescription and nutrition guidelines.;Long Term: Cholesterol controlled with medications as prescribed, with individualized exercise RX and with personalized nutrition plan. Value goals: LDL < 70mg , HDL > 40 mg.    Stress  Yes    Intervention  Offer individual and/or small group education and counseling on adjustment to heart disease, stress management and health-related lifestyle change. Teach and support self-help strategies.;Refer participants experiencing significant psychosocial distress to appropriate mental health specialists for further evaluation and treatment. When possible, include family members and significant others in education/counseling sessions.    Expected Outcomes  Short Term: Participant demonstrates changes in health-related behavior, relaxation and other stress management skills, ability to obtain effective social support, and compliance with psychotropic medications if prescribed.;Long Term: Emotional wellbeing is indicated by absence of clinically significant psychosocial distress or social isolation.       Core Components/Risk Factors/Patient Goals Review:  Goals and Risk Factor Review    Row Name 06/03/18 1145             Core Components/Risk Factors/Patient Goals Review   Personal Goals Review  Weight Management/Obesity;Stress;Lipids       Review  Ellisyn's vital signs have been stable. Katlin has maintained her current weight.       Expected Outcomes  Despina will continue to participate in phase 2 cardiac rehab for exercise, nutrtion and lifestyle modfication opportunites.          Core Components/Risk Factors/Patient Goals at Discharge (Final Review):  Goals and Risk Factor Review - 06/03/18 1145      Core Components/Risk Factors/Patient Goals Review   Personal Goals Review  Weight Management/Obesity;Stress;Lipids    Review  Jadaya's  vital signs have been stable. Sachiko has maintained her current weight.    Expected Outcomes  Frankie will continue  to participate in phase 2 cardiac rehab for exercise, nutrtion and lifestyle modfication opportunites.       ITP Comments: ITP Comments    Row Name 05/04/18 0910 06/03/18 1142         ITP Comments  Dr Fransico Him, Medical Director  30 Day ITP comments. Patinet with good participation and attendance in phase 2 cardiac rehab         Comments: See ITP comments.Barnet Pall, RN,BSN 06/03/2018 11:50 AM

## 2018-06-04 ENCOUNTER — Encounter (HOSPITAL_COMMUNITY): Payer: Medicare Other

## 2018-06-07 ENCOUNTER — Encounter (HOSPITAL_COMMUNITY): Payer: Medicare Other

## 2018-06-09 ENCOUNTER — Encounter (HOSPITAL_COMMUNITY): Payer: Medicare Other

## 2018-06-11 ENCOUNTER — Encounter (HOSPITAL_COMMUNITY): Payer: Medicare Other

## 2018-06-11 DIAGNOSIS — M9902 Segmental and somatic dysfunction of thoracic region: Secondary | ICD-10-CM | POA: Diagnosis not present

## 2018-06-11 DIAGNOSIS — M9903 Segmental and somatic dysfunction of lumbar region: Secondary | ICD-10-CM | POA: Diagnosis not present

## 2018-06-11 DIAGNOSIS — M9905 Segmental and somatic dysfunction of pelvic region: Secondary | ICD-10-CM | POA: Diagnosis not present

## 2018-06-11 DIAGNOSIS — M9901 Segmental and somatic dysfunction of cervical region: Secondary | ICD-10-CM | POA: Diagnosis not present

## 2018-06-14 ENCOUNTER — Encounter (HOSPITAL_COMMUNITY)
Admission: RE | Admit: 2018-06-14 | Discharge: 2018-06-14 | Disposition: A | Discharge status: Home / Self Care | Payer: Medicare Other | Source: Ambulatory Visit | Attending: Interventional Cardiology | Admitting: Interventional Cardiology

## 2018-06-14 ENCOUNTER — Encounter (HOSPITAL_COMMUNITY): Payer: Medicare Other

## 2018-06-14 DIAGNOSIS — M1990 Unspecified osteoarthritis, unspecified site: Secondary | ICD-10-CM | POA: Diagnosis not present

## 2018-06-14 DIAGNOSIS — E7800 Pure hypercholesterolemia, unspecified: Secondary | ICD-10-CM | POA: Diagnosis not present

## 2018-06-14 DIAGNOSIS — Z7982 Long term (current) use of aspirin: Secondary | ICD-10-CM | POA: Diagnosis not present

## 2018-06-14 DIAGNOSIS — Z951 Presence of aortocoronary bypass graft: Secondary | ICD-10-CM

## 2018-06-14 DIAGNOSIS — E079 Disorder of thyroid, unspecified: Secondary | ICD-10-CM | POA: Diagnosis not present

## 2018-06-14 DIAGNOSIS — Z79899 Other long term (current) drug therapy: Secondary | ICD-10-CM | POA: Diagnosis not present

## 2018-06-15 NOTE — Progress Notes (Signed)
Cardiology Office Note:    Date:  06/16/2018   ID:  Deborah [name], DOB Nov 29, 1945, MRN [id number]  PCP:  Marton Redwood, MD  Cardiologist:  [name], MD   Referring MD: Marton Redwood, MD   Chief Complaint  Patient presents with  . Coronary Artery Disease    History of Present Illness:    Deborah [name] is a 72 y.o. female with a hx of non-ST elevation myocardial infarction June 2019, hyperlipidemia, and Graves' disease who underwent coronary artery bypass grafting in August 2019.  She has no chest discomfort, palpitations, dyspnea, or other cardiac complaint.  Since surgery she has a dry hacking cough.  She denies claudication.  Past Medical History:  Diagnosis Date  . Arthritis    "qwhere" (02/26/2018)  . Cancer [facility])    "chest" (02/26/2018)  . Coronary artery disease   . Graves' disease   . High cholesterol   . Thyroid disease     Past Surgical History:  Procedure Laterality Date  . ANTERIOR CRUCIATE LIGAMENT REPAIR Left   . BASAL CELL CARCINOMA EXCISION     "chest" (02/26/2018)  . CARDIAC CATHETERIZATION    . CARPAL TUNNEL RELEASE Right   . CATARACT EXTRACTION W/ INTRAOCULAR LENS  IMPLANT, BILATERAL Bilateral   . CORONARY ARTERY BYPASS GRAFT N/A 03/02/2018   Procedure: CORONARY ARTERY BYPASS GRAFTING (CABG);  Surgeon: Grace Isaac, MD;  Location: [facility];  Service: Open Heart Surgery;  Laterality: N/A;  Times 3 using left internal mammary artery to LAD and endoscopically harvested left saphenous vein to Diagonal and PDA.  Marland Kitchen DILATION AND CURETTAGE OF UTERUS    . LEFT HEART CATH AND CORONARY ANGIOGRAPHY N/A 02/26/2018   Procedure: LEFT HEART CATH AND CORONARY ANGIOGRAPHY;  Surgeon: Leonie Man, MD;  Location: [facility];  Service: Cardiovascular;  Laterality: N/A;  . RIGHT OOPHORECTOMY Right 1970s  . SHOULDER ARTHROSCOPY W/ ROTATOR CUFF REPAIR Bilateral   . TEE WITHOUT CARDIOVERSION N/A 03/02/2018   Procedure: TRANSESOPHAGEAL ECHOCARDIOGRAM  (TEE);  Surgeon: Grace Isaac, MD;  Location: [facility];  Service: Open Heart Surgery;  Laterality: N/A;  . TONSILLECTOMY    . TRIGGER FINGER RELEASE Bilateral    "8 of them"    Current Medications: Current Meds  Medication Sig  . aspirin EC 325 MG EC tablet Take 1 tablet (325 mg total) by mouth daily.  . Calcium Carb-Cholecalciferol (CALCIUM+D3 PO) Take 1 tablet by mouth 2 (two) times daily after a meal.  . Cholecalciferol (VITAMIN D-3) 1000 units CAPS Take 1,000 Units by mouth at bedtime.  Marland Kitchen lisinopril (PRINIVIL,ZESTRIL) 2.5 MG tablet Take 1 tablet (2.5 mg total) by mouth daily.  . methimazole (TAPAZOLE) 5 MG tablet Take 5 mg by mouth See admin instructions. Take 5 mg by mouth at bedtime on Mon/Tues/Wed/Thurs/Fri/Sat  . metoprolol tartrate (LOPRESSOR) 25 MG tablet TAKE 1 TABLET BY MOUTH TWICE A DAY  . Multiple Vitamins-Calcium (ONE-A-DAY WOMENS FORMULA) TABS Take 1 tablet by mouth daily.  . pravastatin (PRAVACHOL) 40 MG tablet Take 40 mg by mouth at bedtime.     Allergies:   Patient has no known allergies.   Social History   Socioeconomic History  . Marital status: Married    Spouse name: [name]  . Number of children: 0  . Years of education: Not on file  . Highest education level: Not on file  Occupational History  . Not on file  Social Needs  . Financial resource strain: Not hard  at all  . Food insecurity:    Worry: Never true    Inability: Never true  . Transportation needs:    Medical: No    Non-medical: No  Tobacco Use  . Smoking status: Former Smoker    Packs/day: 0.50    Years: 30.00    Pack years: 15.00    Types: Cigarettes  . Smokeless tobacco: Never Used  . Tobacco comment: 02/26/2018 "nothing since before 2000"  Substance and Sexual Activity  . Alcohol use: Yes    Alcohol/week: 6.0 standard drinks    Types: 6 Standard drinks or equivalent per week  . Drug use: Never  . Sexual activity: Not Currently  Lifestyle  . Physical activity:    Days per week:  7 days    Minutes per session: 30 min  . Stress: To some extent  Relationships  . Social connections:    Talks on phone: Not on file    Gets together: Not on file    Attends religious service: Not on file    Active member of club or organization: Not on file    Attends meetings of clubs or organizations: Not on file    Relationship status: Not on file  Other Topics Concern  . Not on file  Social History Narrative  . Not on file     Family History: The patient's family history includes CAD in her father; Hypercholesterolemia in her father and sister; Multiple myeloma in her brother; Osteoporosis in her mother.  ROS:   Please see the history of present illness.    No complaint other than cough as noted before.  All other systems reviewed and are negative.  EKGs/Labs/Other Studies Reviewed:    The following studies were reviewed today: No new data  EKG:  EKG is not ordered today.    Recent Labs: 03/01/2018: ALT 17 03/03/2018: Magnesium 2.1 03/04/2018: BUN 11; Creatinine, Ser 0.59; Potassium 4.1; Sodium 132 03/26/2018: Hemoglobin 10.5; Platelets 367  Recent Lipid Panel    Component Value Date/Time   CHOL 161 02/27/2018 0518   TRIG 45 02/27/2018 0518   HDL 64 02/27/2018 0518   CHOLHDL 2.5 02/27/2018 0518   VLDL 9 02/27/2018 0518   LDLCALC 88 02/27/2018 0518    Physical Exam:    VS:  BP 108/64   Pulse 79   Ht 5' 3.5" (1.613 m)   Wt 97 lb (44 kg)   BMI 16.91 kg/m     Wt Readings from Last 3 Encounters:  06/16/18 97 lb (44 kg)  05/04/18 97 lb 3.6 oz (44.1 kg)  04/19/18 98 lb (44.5 kg)     GEN: Well nourished, well developed in no acute distress HEENT: Normal NECK: No JVD. LYMPHATICS: No lymphadenopathy CARDIAC: RRR, no murmur,  No gallop, no  edema. VASCULAR: 2+ bilateral radial pulses.  No bruits. RESPIRATORY:  Clear to auscultation without rales, wheezing or rhonchi  ABDOMEN: Soft, non-tender, non-distended, No pulsatile mass, MUSCULOSKELETAL: No deformity    SKIN: Warm and dry NEUROLOGIC:  Alert and oriented x 3 PSYCHIATRIC:  Normal affect   ASSESSMENT:    1. Coronary artery disease involving coronary bypass graft of native heart with angina pectoris ([place])   2. Mixed hyperlipidemia   3. ACE-inhibitor cough    PLAN:    In order of problems listed above:  1. He is doing well status post multivessel coronary bypass grafting.  She is in cardiac rehab and not having difficulty. 2. LDL was 106 in August.  We  need more intense LDL lowering.  She has had difficulty with more potent statins.  Recommend adding Zetia to get closer to 70 and hopefully below.  This is being managed by Dr. Brigitte Pulse. 3. Discontinue lisinopril.  Cough should dissipate.  Clinical follow-up in 6 months.  Call if cardiac complaints.  No change in therapy.   Medication Adjustments/Labs and Tests Ordered: Current medicines are reviewed at length with the patient today.  Concerns regarding medicines are outlined above.  No orders of the defined types were placed in this encounter.  No orders of the defined types were placed in this encounter.   Patient Instructions  Your physician has recommended you make the following change in your medication:  STOP LISINOPRIL  Your physician wants you to follow-up in: [place] will receive a reminder letter in the mail two months in advance. If you don't receive a letter, please call our office to schedule the follow-up appointment.     Signed, [name], MD  06/16/2018 1:38 PM    [facility]

## 2018-06-16 ENCOUNTER — Encounter (HOSPITAL_COMMUNITY): Payer: Medicare Other

## 2018-06-16 ENCOUNTER — Ambulatory Visit (INDEPENDENT_AMBULATORY_CARE_PROVIDER_SITE_OTHER): Payer: Medicare Other | Admitting: Interventional Cardiology

## 2018-06-16 ENCOUNTER — Encounter (HOSPITAL_COMMUNITY)
Admission: RE | Admit: 2018-06-16 | Discharge: 2018-06-16 | Disposition: A | Discharge status: Home / Self Care | Payer: Medicare Other | Source: Ambulatory Visit | Attending: Interventional Cardiology | Admitting: Interventional Cardiology

## 2018-06-16 ENCOUNTER — Encounter: Payer: Self-pay | Admitting: Interventional Cardiology

## 2018-06-16 VITALS — BP 108/64 | HR 79 | Ht 63.5 in | Wt 97.0 lb

## 2018-06-16 DIAGNOSIS — E7800 Pure hypercholesterolemia, unspecified: Secondary | ICD-10-CM | POA: Diagnosis not present

## 2018-06-16 DIAGNOSIS — I200 Unstable angina: Secondary | ICD-10-CM

## 2018-06-16 DIAGNOSIS — T464X5A Adverse effect of angiotensin-converting-enzyme inhibitors, initial encounter: Secondary | ICD-10-CM | POA: Diagnosis not present

## 2018-06-16 DIAGNOSIS — E782 Mixed hyperlipidemia: Secondary | ICD-10-CM | POA: Diagnosis not present

## 2018-06-16 DIAGNOSIS — I25709 Atherosclerosis of coronary artery bypass graft(s), unspecified, with unspecified angina pectoris: Secondary | ICD-10-CM

## 2018-06-16 DIAGNOSIS — M1990 Unspecified osteoarthritis, unspecified site: Secondary | ICD-10-CM | POA: Diagnosis not present

## 2018-06-16 DIAGNOSIS — R05 Cough: Secondary | ICD-10-CM

## 2018-06-16 DIAGNOSIS — Z79899 Other long term (current) drug therapy: Secondary | ICD-10-CM | POA: Diagnosis not present

## 2018-06-16 DIAGNOSIS — Z951 Presence of aortocoronary bypass graft: Secondary | ICD-10-CM | POA: Diagnosis not present

## 2018-06-16 DIAGNOSIS — Z7982 Long term (current) use of aspirin: Secondary | ICD-10-CM | POA: Diagnosis not present

## 2018-06-16 DIAGNOSIS — E079 Disorder of thyroid, unspecified: Secondary | ICD-10-CM | POA: Diagnosis not present

## 2018-06-16 NOTE — Patient Instructions (Addendum)
Your physician has recommended you make the following change in your medication:  STOP LISINOPRIL  Your physician wants you to follow-up in: [place] will receive a reminder letter in the mail two months in advance. If you don't receive a letter, please call our office to schedule the follow-up appointment.

## 2018-06-17 ENCOUNTER — Other Ambulatory Visit: Payer: Self-pay | Admitting: Physician Assistant

## 2018-06-18 ENCOUNTER — Encounter (HOSPITAL_COMMUNITY): Payer: Medicare Other

## 2018-06-21 ENCOUNTER — Encounter (HOSPITAL_COMMUNITY): Payer: Medicare Other

## 2018-06-23 ENCOUNTER — Encounter (HOSPITAL_COMMUNITY): Payer: Medicare Other

## 2018-06-25 ENCOUNTER — Encounter (HOSPITAL_COMMUNITY): Payer: Medicare Other

## 2018-06-28 ENCOUNTER — Encounter (HOSPITAL_COMMUNITY): Payer: Medicare Other

## 2018-06-30 ENCOUNTER — Encounter (HOSPITAL_COMMUNITY): Payer: Medicare Other

## 2018-07-01 ENCOUNTER — Encounter (HOSPITAL_COMMUNITY): Payer: Self-pay | Admitting: *Deleted

## 2018-07-01 DIAGNOSIS — Z951 Presence of aortocoronary bypass graft: Secondary | ICD-10-CM

## 2018-07-01 NOTE — Progress Notes (Signed)
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  aspirin EC 325 MG EC tablet, Take 1 tablet (325 mg total) by mouth daily., Disp: 30 tablet, Rfl: 0 .  Calcium Carb-Cholecalciferol (CALCIUM+D3 PO), Take 1 tablet by mouth 2 (two) times daily after a meal., Disp: , Rfl:  .  Cholecalciferol (VITAMIN D-3) 1000 units CAPS, Take 1,000 Units by mouth at bedtime., Disp: , Rfl:  .  lisinopril (PRINIVIL,ZESTRIL) 2.5 MG tablet, Take 1 tablet (2.5 mg total) by mouth daily., Disp: 30 tablet, Rfl: 1 .  methimazole (TAPAZOLE) 5 MG tablet, Take 5 mg by mouth See admin instructions. Take 5 mg by mouth at bedtime on Mon/Tues/Wed/Thurs/Fri/Sat, Disp: , Rfl: 3 .  metoprolol tartrate (LOPRESSOR) 25 MG tablet, TAKE 1 TABLET BY MOUTH TWICE A DAY, Disp: 60 tablet, Rfl: 1 .  Multiple Vitamins-Calcium (ONE-A-DAY WOMENS FORMULA) TABS, Take 1 tablet by mouth daily., Disp: , Rfl:  .  pravastatin (PRAVACHOL) 40 MG tablet, Take 40 mg by mouth at bedtime., Disp: , Rfl:   Past Medical History: Past Medical History:  Diagnosis Date  . Arthritis    "qwhere" (02/26/2018)  . Cancer [facility])    "chest" (02/26/2018)  . Coronary artery disease   . Graves' disease   . High cholesterol   . Thyroid disease     Tobacco Use: Social History   Tobacco Use  Smoking Status Former Smoker  . Packs/day: 0.50  . Years: 30.00  . Pack years: 15.00  . Types: Cigarettes  Smokeless Tobacco Never Used  Tobacco Comment   02/26/2018 "nothing since  before 2000"    Labs: Recent Review Flowsheet Data    Labs for ITP Cardiac and Pulmonary Rehab Latest Ref Rng & Units 03/02/2018 03/02/2018 03/02/2018 03/02/2018 03/03/2018   Cholestrol 0 - 200 mg/dL - - - - -   LDLCALC 0 - 99 mg/dL - - - - -   HDL >40 mg/dL - - - - -   Trlycerides <150 mg/dL - - - - -   Hemoglobin A1c 4.8 - 5.6 % - - - - -   PHART 7.350 - 7.450 7.380 7.321(L) 7.313(L) - -   PCO2ART 32.0 - 48.0 mmHg 30.5(L) 39.4 41.4 - -   HCO3 20.0 - 28.0 mmol/L 18.3(L) 20.4 20.9 - -   TCO2 22 - 32 mmol/L 19(L) 22 22 21(L) 22   ACIDBASEDEF 0.0 - 2.0 mmol/L 6.0(H) 5.0(H) 5.0(H) - -   O2SAT % 99.0 99.0 98.0 - -      Capillary Blood Glucose: Lab Results  Component Value Date   GLUCAP 95 03/04/2018   GLUCAP 105 (H) 03/04/2018   GLUCAP 87 03/04/2018   GLUCAP 94 03/03/2018   GLUCAP 93 03/03/2018     Exercise Target Goals: Exercise Program Goal: Individual exercise prescription set using results from initial 6 min walk test and THRR while considering  patient's activity barriers and safety.   Exercise Prescription Goal: Initial exercise prescription builds to 30-45 minutes  a day of aerobic activity, 2-3 days per week.  Home exercise guidelines will be given to patient during program as part of exercise prescription that the participant will acknowledge.  Activity Barriers & Risk Stratification: Activity Barriers & Cardiac Risk Stratification - 05/04/18 0851      Activity Barriers & Cardiac Risk Stratification   Activity Barriers  Other (comment)    Comments  Left knee ACL replacement. Bilateral shoulder repair.    Cardiac Risk Stratification  High       6 Minute Walk: 6 Minute Walk    Row Name 05/04/18 0840         6 Minute Walk   Phase  Initial     Distance  1866 feet     Walk Time  6 minutes     # of Rest Breaks  0     MPH  3.53     METS  4.09     RPE  11     VO2 Peak  14.31     Symptoms  No     Resting HR  65 bpm     Resting BP  104/60     Resting Oxygen  Saturation   98 %     Exercise Oxygen Saturation  during 6 min walk  99 %     Max Ex. HR  93 bpm     Max Ex. BP  124/72     2 Minute Post BP  122/62        Oxygen Initial Assessment:   Oxygen Re-Evaluation:   Oxygen Discharge (Final Oxygen Re-Evaluation):   Initial Exercise Prescription: Initial Exercise Prescription - 05/04/18 0900      Date of Initial Exercise RX and Referring Provider   Date  05/04/18    Referring Provider  Daneen Schick, MD      Treadmill   MPH  2.5    Grade  1    Minutes  10    METs  3.26      Bike   Level  0.7    Minutes  10    METs  3.94      NuStep   Level  4    SPM  85    Minutes  10    METs  3      Prescription Details   Frequency (times per week)  3    Duration  Progress to 30 minutes of continuous aerobic without signs/symptoms of physical distress      Intensity   THRR 40-80% of Max Heartrate  59-118    Ratings of Perceived Exertion  11-13    Perceived Dyspnea  0-4      Progression   Progression  Continue to progress workloads to maintain intensity without signs/symptoms of physical distress.      Resistance Training   Training Prescription  Yes    Weight  3lbs    Reps  10-15       Perform Capillary Blood Glucose checks as needed.  Exercise Prescription Changes: Exercise Prescription Changes    Row Name 05/10/18 0947 05/26/18 0831 06/14/18 0954         Response to Exercise   Blood Pressure (Admit)  112/60  112/70  118/78     Blood Pressure (Exercise)  132/62  116/60  128/64     Blood Pressure (Exit)  114/62  98/60  102/68     Heart Rate (Admit)  64 bpm  87 bpm  69 bpm  Heart Rate (Exercise)  95 bpm  93 bpm  91 bpm     Heart Rate (Exit)  62 bpm  63 bpm  69 bpm     Rating of Perceived Exertion (Exercise)  12  11  11      Symptoms  none  none  none     Duration  Progress to 30 minutes of  aerobic without signs/symptoms of physical distress  Progress to 30 minutes of  aerobic without signs/symptoms of physical  distress  Progress to 30 minutes of  aerobic without signs/symptoms of physical distress     Intensity  THRR unchanged  THRR unchanged  THRR unchanged       Progression   Progression  Continue to progress workloads to maintain intensity without signs/symptoms of physical distress.  Continue to progress workloads to maintain intensity without signs/symptoms of physical distress.  Continue to progress workloads to maintain intensity without signs/symptoms of physical distress.     Average METs  3.7  4.1  4       Resistance Training   Training Prescription  Yes  No Relaxation day, no weights  Yes     Weight  3lbs  -  3lbs     Reps  10-15  -  10-15     Time  10 Minutes  -  10 Minutes       Interval Training   Interval Training  No  No  No       Treadmill   MPH  2.5  2.6  2.6     Grade  1  1  1      Minutes  10  10  10      METs  3.26  3.35  3.35       Bike   Level  1  1  1      Minutes  10  10  10      METs  5.24  5.31  5.24       NuStep   Level  4  4  4      SPM  85  85  85     Minutes  10  10  10      METs  2.6  3.7  3.3       Home Exercise Plan   Plans to continue exercise at  -  [facility] (comment)  [facility]     Frequency  -  Add 3 additional days to program exercise sessions.  Add 3 additional days to program exercise sessions.     Initial Home Exercises Provided  -  05/19/18  05/19/18        Exercise Comments: Exercise Comments    Row Name 05/10/18 1044 05/19/18 1009 05/26/18 0847 06/14/18 1000     Exercise Comments  Patient tolerated first session of exercise well.  Reviewed home exercise guidelines and goals with patient.  Reviewed METs and goals with patient.  Reviewed METs with patient.       Exercise Goals and Review: Exercise Goals    Row Name 05/04/18 0853             Exercise Goals   Increase Physical Activity  Yes       Intervention  Provide advice, education, support and counseling about physical activity/exercise  needs.;Develop an individualized exercise prescription for aerobic and resistive training based on initial evaluation findings, risk stratification, comorbidities and participant's personal goals.       Expected Outcomes  Short Term:  Attend rehab on a regular basis to increase amount of physical activity.;Long Term: Exercising regularly at least 3-5 days a week.;Long Term: Add in home exercise to make exercise part of routine and to increase amount of physical activity.       Increase Strength and Stamina  Yes       Intervention  Provide advice, education, support and counseling about physical activity/exercise needs.;Develop an individualized exercise prescription for aerobic and resistive training based on initial evaluation findings, risk stratification, comorbidities and participant's personal goals.       Expected Outcomes  Short Term: Increase workloads from initial exercise prescription for resistance, speed, and METs.;Short Term: Perform resistance training exercises routinely during rehab and add in resistance training at home;Long Term: Improve cardiorespiratory fitness, muscular endurance and strength as measured by increased METs and functional capacity (6MWT)       Able to understand and use rate of perceived exertion (RPE) scale  Yes       Intervention  Provide education and explanation on how to use RPE scale       Expected Outcomes  Short Term: Able to use RPE daily in rehab to express subjective intensity level;Long Term:  Able to use RPE to guide intensity level when exercising independently       Knowledge and understanding of Target Heart Rate Range (THRR)  Yes       Intervention  Provide education and explanation of THRR including how the numbers were predicted and where they are located for reference       Expected Outcomes  Short Term: Able to state/look up THRR;Long Term: Able to use THRR to govern intensity when exercising independently;Short Term: Able to use daily as guideline  for intensity in rehab       Able to check pulse independently  Yes       Intervention  Provide education and demonstration on how to check pulse in carotid and radial arteries.;Review the importance of being able to check your own pulse for safety during independent exercise       Expected Outcomes  Short Term: Able to explain why pulse checking is important during independent exercise;Long Term: Able to check pulse independently and accurately       Understanding of Exercise Prescription  Yes       Intervention  Provide education, explanation, and written materials on patient's individual exercise prescription       Expected Outcomes  Short Term: Able to explain program exercise prescription;Long Term: Able to explain home exercise prescription to exercise independently          Exercise Goals Re-Evaluation : Exercise Goals Re-Evaluation    Row Name 05/10/18 1044 05/19/18 1009 05/26/18 0847 06/30/18 1703       Exercise Goal Re-Evaluation   Exercise Goals Review  Able to understand and use rate of perceived exertion (RPE) scale;Increase Physical Activity  Able to understand and use rate of perceived exertion (RPE) scale;Understanding of Exercise Prescription;Knowledge and understanding of Target Heart Rate Range (THRR)  Able to understand and use rate of perceived exertion (RPE) scale;Understanding of Exercise Prescription;Knowledge and understanding of Target Heart Rate Range (THRR);Increase Physical Activity  -    Comments  Patient able to understand and use RPE scale appropriately.  Reviewed home exercise guidelines with patient including THRR, RPE scale and endpoints for exercise. Pt is currently participating in a blance class at [facility] on Tuesdays and Thursdays in addition to exercise at [facility]. Pt is also walking for  her exericse.  Patinet is walking about 30 minutes daily and pariticpates in group fitness classes 3 days/week at [facility]. Pt also has access to fitness center at [facility].  Patient on vacation for 2 weeks, unable to review METs and goals.    Expected Outcomes  Increase workloads as tolerated to help achieve personal health and fitness goals.  Patient will continue exercise at least 5 days/week to help improve cardiorespiratory fitness.  Patient will continue current exercise routine, increasing workloads as tolerated to help achieve personal health and fitness goals.  -       Discharge Exercise Prescription (Final Exercise Prescription Changes): Exercise Prescription Changes - 06/14/18 0954      Response to Exercise   Blood Pressure (Admit)  118/78    Blood Pressure (Exercise)  128/64    Blood Pressure (Exit)  102/68    Heart Rate (Admit)  69 bpm    Heart Rate (Exercise)  91 bpm    Heart Rate (Exit)  69 bpm    Rating of Perceived Exertion (Exercise)  11    Symptoms  none    Duration  Progress to 30 minutes of  aerobic without signs/symptoms of physical distress    Intensity  THRR unchanged      Progression   Progression  Continue to progress workloads to maintain intensity without signs/symptoms of physical distress.    Average METs  4      Resistance Training   Training Prescription  Yes    Weight  3lbs    Reps  10-15    Time  10 Minutes      Interval Training   Interval Training  No      Treadmill   MPH  2.6    Grade  1    Minutes  10    METs  3.35      Bike   Level  1    Minutes  10    METs  5.24      NuStep   Level  4    SPM  85    Minutes  10    METs  3.3      Home Exercise Plan   Plans to continue exercise at  [facility] (comment)    Frequency  Add 3 additional days to program exercise sessions.    Initial Home Exercises Provided  05/19/18       Nutrition:  Target Goals: Understanding of nutrition guidelines, daily intake of sodium 1500mg , cholesterol 200mg , calories 30% from fat and 7% or less from saturated fats, daily to have 5 or more servings of fruits and vegetables.  Biometrics: Pre Biometrics  - 05/04/18 0803      Pre Biometrics   Height  5' 3.5" (1.613 m)    Weight  97 lb 3.6 oz (44.1 kg)    Waist Circumference  24 inches    Hip Circumference  32.25 inches    Waist to Hip Ratio  0.74 %    BMI (Calculated)  16.95    Triceps Skinfold  8 mm    % Body Fat  23.7 %    Grip Strength  24 kg    Flexibility  12 in    Single Leg Stand  30 seconds        Nutrition Therapy Plan and Nutrition Goals: Nutrition Therapy & Goals - 05/04/18 0939      Nutrition Therapy   Diet  heart healthy  Personal Nutrition Goals   Nutrition Goal  pt will increase the number of meals and snacks consumed daily      Intervention Plan   Intervention  Prescribe, educate and counsel regarding individualized specific dietary modifications aiming towards targeted core components such as weight, hypertension, lipid management, diabetes, heart failure and other comorbidities.    Expected Outcomes  Short Term Goal: Understand basic principles of dietary content, such as calories, fat, sodium, cholesterol and nutrients.       Nutrition Assessments: Nutrition Assessments - 05/04/18 0940      MEDFICTS Scores   Pre Score  36       Nutrition Goals Re-Evaluation:   Nutrition Goals Re-Evaluation:   Nutrition Goals Discharge (Final Nutrition Goals Re-Evaluation):   Psychosocial: Target Goals: Acknowledge presence or absence of significant depression and/or stress, maximize coping skills, provide positive support system. Participant is able to verbalize types and ability to use techniques and skills needed for reducing stress and depression.  Initial Review & Psychosocial Screening: Initial Psych Review & Screening - 05/04/18 1018      Initial Review   Current issues with  Current Stress Concerns    Source of Stress Concerns  --   Recently downsized and moved   Comments  Husband is currently receiving cancer treatment      [place]?  Yes   Terree has her husband  and sister for support     Barriers   Psychosocial barriers to participate in program  The patient should benefit from training in stress management and relaxation.      Screening Interventions   Interventions  Encouraged to exercise    Expected Outcomes  Long Term Goal: Stressors or current issues are controlled or eliminated.       Quality of Life Scores: Quality of Life - 05/04/18 0953      Quality of Life   Select  Quality of Life      Quality of Life Scores   Health/Function Pre  25.07 %    Socioeconomic Pre  27.14 %    Psych/Spiritual Pre  28.57 %    Family Pre  28.5 %    GLOBAL Pre  26.67 %      Scores of 19 and below usually indicate a poorer quality of life in these areas.  A difference of  2-3 points is a clinically meaningful difference.  A difference of 2-3 points in the total score of the Quality of Life Index has been associated with significant improvement in overall quality of life, self-image, physical symptoms, and general health in studies assessing change in quality of life.  PHQ-9: Recent Review Flowsheet Data    Depression screen [facility] 2/9 05/10/2018   Decreased Interest 0   Down, Depressed, Hopeless 0   PHQ - 2 Score 0     Interpretation of Total Score  Total Score Depression Severity:  1-4 = Minimal depression, 5-9 = Mild depression, 10-14 = Moderate depression, 15-19 = Moderately severe depression, 20-27 = Severe depression   Psychosocial Evaluation and Intervention:   Psychosocial Re-Evaluation: Psychosocial Re-Evaluation    [place] Name 06/03/18 1144 07/01/18 1229           Psychosocial Re-Evaluation   Current issues with  Current Stress Concerns  Current Stress Concerns      Comments  Jasminemarie has not voiced any increased stressors at this time will continue to monitor.  [place] has not voiced any increased stressors at this  time will continue to monitor.      Interventions  Stress management education;Encouraged to attend Cardiac Rehabilitation for  the exercise  Stress management education;Encouraged to attend Cardiac Rehabilitation for the exercise      Comments  Husband is currently receiving cancer treatment  Husband is currently receiving cancer treatment        Initial Review   Source of Stress Concerns  Family  Family         Psychosocial Discharge (Final Psychosocial Re-Evaluation): Psychosocial Re-Evaluation - 07/01/18 1229      Psychosocial Re-Evaluation   Current issues with  Current Stress Concerns    Comments  Yevette has not voiced any increased stressors at this time will continue to monitor.    Interventions  Stress management education;Encouraged to attend Cardiac Rehabilitation for the exercise    Comments  Husband is currently receiving cancer treatment      Initial Review   Source of Stress Concerns  Family       Vocational Rehabilitation: Provide vocational rehab assistance to qualifying candidates.   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 Participant will state understanding/return demonstration of topics presented.  Learning Barriers/Preferences: Learning Barriers/Preferences - 05/04/18 0910      Learning Barriers/Preferences   Learning Barriers  None    Learning Preferences  None       Education Topics: Count Your Pulse:  -Group instruction provided by verbal instruction, demonstration, patient participation and written materials to support subject.  Instructors address importance of being able to find your pulse and how to count your pulse when at home without a heart monitor.  Patients get hands on experience counting their pulse with staff help and  individually.   Heart Attack, Angina, and Risk Factor Modification:  -Group instruction provided by verbal instruction, video, and written materials to support subject.  Instructors address signs and symptoms of angina and heart attacks.    Also discuss risk factors for heart disease and how to make changes to improve heart health risk factors.   CARDIAC REHAB PHASE II EXERCISE from 06/02/2018 in [place]  Date  06/02/18  Instruction Review Code  2- Demonstrated Understanding      Functional Fitness:  -Group instruction provided by verbal instruction, demonstration, patient participation, and written materials to support subject.  Instructors address safety measures for doing things around the house.  Discuss how to get up and down off the floor, how to pick things up properly, how to safely get out of a chair without assistance, and balance training.   Meditation and Mindfulness:  -Group instruction provided by verbal instruction, patient participation, and written materials to support subject.  Instructor addresses importance of mindfulness and meditation practice to help reduce stress and improve awareness.  Instructor also leads participants through a meditation exercise.    Stretching for Flexibility and Mobility:  -Group instruction provided by verbal instruction, patient participation, and written materials to support subject.  Instructors lead participants through series of stretches that are designed to increase flexibility thus improving mobility.  These stretches are additional exercise for major muscle groups that are typically performed during regular  warm up and cool down.   Hands Only CPR:  -Group verbal, video, and participation provides a basic overview of AHA guidelines for community CPR. Role-play of emergencies allow participants the opportunity to practice calling for help and chest compression technique with discussion of AED  use.   Hypertension: -Group verbal and written instruction that provides a basic overview of hypertension including the most recent diagnostic guidelines, risk factor reduction with self-care instructions and medication management.    Nutrition I class: Heart Healthy Eating:  -Group instruction provided by PowerPoint slides, verbal discussion, and written materials to support subject matter. The instructor gives an explanation and review of the Therapeutic Lifestyle Changes diet recommendations, which includes a discussion on lipid goals, dietary fat, sodium, fiber, plant stanol/sterol esters, sugar, and the components of a well-balanced, healthy diet.   Nutrition II class: Lifestyle Skills:  -Group instruction provided by PowerPoint slides, verbal discussion, and written materials to support subject matter. The instructor gives an explanation and review of label reading, grocery shopping for heart health, heart healthy recipe modifications, and ways to make healthier choices when eating out.   Diabetes Question & Answer:  -Group instruction provided by PowerPoint slides, verbal discussion, and written materials to support subject matter. The instructor gives an explanation and review of diabetes co-morbidities, pre- and post-prandial blood glucose goals, pre-exercise blood glucose goals, signs, symptoms, and treatment of hypoglycemia and hyperglycemia, and foot care basics.   Diabetes Blitz:  -Group instruction provided by PowerPoint slides, verbal discussion, and written materials to support subject matter. The instructor gives an explanation and review of the physiology behind type 1 and type 2 diabetes, diabetes medications and rational behind using different medications, pre- and post-prandial blood glucose recommendations and Hemoglobin A1c goals, diabetes diet, and exercise including blood glucose guidelines for exercising safely.    Portion Distortion:  -Group instruction provided by  PowerPoint slides, verbal discussion, written materials, and food models to support subject matter. The instructor gives an explanation of serving size versus portion size, changes in portions sizes over the last 20 years, and what consists of a serving from each food group.   Stress Management:  -Group instruction provided by verbal instruction, video, and written materials to support subject matter.  Instructors review role of stress in heart disease and how to cope with stress positively.     Exercising on Your Own:  -Group instruction provided by verbal instruction, power point, and written materials to support subject.  Instructors discuss benefits of exercise, components of exercise, frequency and intensity of exercise, and end points for exercise.  Also discuss use of nitroglycerin and activating EMS.  Review options of places to exercise outside of rehab.  Review guidelines for sex with heart disease.   Cardiac Drugs I:  -Group instruction provided by verbal instruction and written materials to support subject.  Instructor reviews cardiac drug classes: antiplatelets, anticoagulants, beta blockers, and statins.  Instructor discusses reasons, side effects, and lifestyle considerations for each drug class.   CARDIAC REHAB PHASE II EXERCISE from 06/02/2018 in [place]  Date  05/12/18  Instruction Review Code  2- Demonstrated Understanding      Cardiac Drugs II:  -Group instruction provided by verbal instruction and written materials to support subject.  Instructor reviews cardiac drug classes: angiotensin converting enzyme inhibitors (ACE-I), angiotensin II receptor blockers (ARBs), nitrates, and calcium channel blockers.  Instructor discusses reasons, side effects, and lifestyle considerations for each drug class.   Anatomy and Physiology  of the Circulatory System:  Group verbal and written instruction and models provide basic cardiac anatomy and  physiology, with the coronary electrical and arterial systems. Review of: AMI, Angina, Valve disease, Heart Failure, Peripheral Artery Disease, Cardiac Arrhythmia, Pacemakers, and the ICD.   CARDIAC REHAB PHASE II EXERCISE from 06/02/2018 in [place]  Date  05/26/18  Educator  RN  Instruction Review Code  2- Demonstrated Understanding      Other Education:  -Group or individual verbal, written, or video instructions that support the educational goals of the cardiac rehab program.   Holiday Eating Survival Tips:  -Group instruction provided by PowerPoint slides, verbal discussion, and written materials to support subject matter. The instructor gives patients tips, tricks, and techniques to help them not only survive but enjoy the holidays despite the onslaught of food that accompanies the holidays.   Knowledge Questionnaire Score: Knowledge Questionnaire Score - 05/04/18 0954      Knowledge Questionnaire Score   Pre Score  20/24       Core Components/Risk Factors/Patient Goals at Admission: Personal Goals and Risk Factors at Admission - 05/04/18 0954      Core Components/Risk Factors/Patient Goals on Admission    Weight Management  Weight Gain;Yes    Intervention  Weight Management: Develop a combined nutrition and exercise program designed to reach desired caloric intake, while maintaining appropriate intake of nutrient and fiber, sodium and fats, and appropriate energy expenditure required for the weight goal.;Weight Management: Provide education and appropriate resources to help participant work on and attain dietary goals.    Admit Weight  97 lb 3.6 oz (44.1 kg)    Expected Outcomes  Short Term: Continue to assess and modify interventions until short term weight is achieved;Long Term: Adherence to nutrition and physical activity/exercise program aimed toward attainment of established weight goal;Understanding recommendations for meals to include  15-35% energy as protein, 25-35% energy from fat, 35-60% energy from carbohydrates, less than 200mg  of dietary cholesterol, 20-35 gm of total fiber daily;Understanding of distribution of calorie intake throughout the day with the consumption of 4-5 meals/snacks;Weight Gain: Understanding of general recommendations for a high calorie, high protein meal plan that promotes weight gain by distributing calorie intake throughout the day with the consumption for 4-5 meals, snacks, and/or supplements    Lipids  Yes    Intervention  Provide education and support for participant on nutrition & aerobic/resistive exercise along with prescribed medications to achieve LDL 70mg , HDL >40mg .    Expected Outcomes  Short Term: Participant states understanding of desired cholesterol values and is compliant with medications prescribed. Participant is following exercise prescription and nutrition guidelines.;Long Term: Cholesterol controlled with medications as prescribed, with individualized exercise RX and with personalized nutrition plan. Value goals: LDL < 70mg , HDL > 40 mg.    Stress  Yes    Intervention  Offer individual and/or small group education and counseling on adjustment to heart disease, stress management and health-related lifestyle change. Teach and support self-help strategies.;Refer participants experiencing significant psychosocial distress to appropriate mental health specialists for further evaluation and treatment. When possible, include family members and significant others in education/counseling sessions.    Expected Outcomes  Short Term: Participant demonstrates changes in health-related behavior, relaxation and other stress management skills, ability to obtain effective social support, and compliance with psychotropic medications if prescribed.;Long Term: Emotional wellbeing is indicated by absence of clinically significant psychosocial distress or social isolation.       Core Components/Risk  Factors/Patient  Goals Review:  Goals and Risk Factor Review    Row Name 06/03/18 1145 07/01/18 1229           Core Components/Risk Factors/Patient Goals Review   Personal Goals Review  Weight Management/Obesity;Stress;Lipids  Weight Management/Obesity;Stress;Lipids      Review  Anthea's vital signs have been stable. Jamonica has maintained her current weight.  Kalanie's vital signs have been stable. Rebbie has maintained her current weight.      Expected Outcomes  Margia will continue to participate in phase 2 cardiac rehab for exercise, nutrtion and lifestyle modfication opportunites.  Kenae will continue to participate in phase 2 cardiac rehab for exercise, nutrtion and lifestyle modfication opportunites.         Core Components/Risk Factors/Patient Goals at Discharge (Final Review):  Goals and Risk Factor Review - 07/01/18 1229      Core Components/Risk Factors/Patient Goals Review   Personal Goals Review  Weight Management/Obesity;Stress;Lipids    Review  Jakhiya's vital signs have been stable. Raneisha has maintained her current weight.    Expected Outcomes  Foye will continue to participate in phase 2 cardiac rehab for exercise, nutrtion and lifestyle modfication opportunites.       ITP Comments: ITP Comments    Row Name 05/04/18 0910 06/03/18 1142 07/01/18 1227       ITP Comments  Dr Fransico Him, Medical Director  30 Day ITP comments. Patinet with good participation and attendance in phase 2 cardiac rehab  30 Day ITP comments. Patinet with good participation in phase 2 cardiac rehab. Lesta last day of participation was 06/16/18. Teresa is on vacation in [place]        Comments: See ITP comments. Patient is on vacation in [place].Barnet Pall, RN,BSN 07/01/2018 12:31 PM

## 2018-07-02 ENCOUNTER — Encounter (HOSPITAL_COMMUNITY): Payer: Medicare Other

## 2018-07-04 ENCOUNTER — Other Ambulatory Visit: Payer: Self-pay | Admitting: Physician Assistant

## 2018-07-05 ENCOUNTER — Other Ambulatory Visit: Payer: Self-pay | Admitting: Physician Assistant

## 2018-07-05 ENCOUNTER — Encounter (HOSPITAL_COMMUNITY)
Admission: RE | Admit: 2018-07-05 | Discharge: 2018-07-05 | Disposition: A | Discharge status: Home / Self Care | Payer: Medicare Other | Source: Ambulatory Visit | Attending: Interventional Cardiology | Admitting: Interventional Cardiology

## 2018-07-05 ENCOUNTER — Encounter (HOSPITAL_COMMUNITY): Payer: Medicare Other

## 2018-07-05 DIAGNOSIS — Z951 Presence of aortocoronary bypass graft: Secondary | ICD-10-CM | POA: Diagnosis not present

## 2018-07-05 DIAGNOSIS — M1990 Unspecified osteoarthritis, unspecified site: Secondary | ICD-10-CM | POA: Diagnosis not present

## 2018-07-05 DIAGNOSIS — Z79899 Other long term (current) drug therapy: Secondary | ICD-10-CM | POA: Insufficient documentation

## 2018-07-05 DIAGNOSIS — E079 Disorder of thyroid, unspecified: Secondary | ICD-10-CM | POA: Diagnosis not present

## 2018-07-05 DIAGNOSIS — E7800 Pure hypercholesterolemia, unspecified: Secondary | ICD-10-CM | POA: Diagnosis not present

## 2018-07-05 DIAGNOSIS — Z7982 Long term (current) use of aspirin: Secondary | ICD-10-CM | POA: Insufficient documentation

## 2018-07-07 ENCOUNTER — Encounter (HOSPITAL_COMMUNITY)
Admission: RE | Admit: 2018-07-07 | Discharge: 2018-07-07 | Disposition: A | Discharge status: Home / Self Care | Payer: Medicare Other | Source: Ambulatory Visit | Attending: Interventional Cardiology | Admitting: Interventional Cardiology

## 2018-07-07 ENCOUNTER — Encounter (HOSPITAL_COMMUNITY): Payer: Medicare Other

## 2018-07-07 DIAGNOSIS — Z951 Presence of aortocoronary bypass graft: Secondary | ICD-10-CM

## 2018-07-07 DIAGNOSIS — E7800 Pure hypercholesterolemia, unspecified: Secondary | ICD-10-CM | POA: Diagnosis not present

## 2018-07-07 DIAGNOSIS — Z7982 Long term (current) use of aspirin: Secondary | ICD-10-CM | POA: Diagnosis not present

## 2018-07-07 DIAGNOSIS — E079 Disorder of thyroid, unspecified: Secondary | ICD-10-CM | POA: Diagnosis not present

## 2018-07-07 DIAGNOSIS — Z79899 Other long term (current) drug therapy: Secondary | ICD-10-CM | POA: Diagnosis not present

## 2018-07-07 DIAGNOSIS — M1990 Unspecified osteoarthritis, unspecified site: Secondary | ICD-10-CM | POA: Diagnosis not present

## 2018-07-09 ENCOUNTER — Encounter (HOSPITAL_COMMUNITY): Payer: Medicare Other

## 2018-07-09 ENCOUNTER — Encounter (HOSPITAL_COMMUNITY)
Admission: RE | Admit: 2018-07-09 | Discharge: 2018-07-09 | Disposition: A | Discharge status: Home / Self Care | Payer: Medicare Other | Source: Ambulatory Visit | Attending: Interventional Cardiology | Admitting: Interventional Cardiology

## 2018-07-09 ENCOUNTER — Other Ambulatory Visit: Payer: Self-pay | Admitting: *Deleted

## 2018-07-09 DIAGNOSIS — Z951 Presence of aortocoronary bypass graft: Secondary | ICD-10-CM | POA: Diagnosis not present

## 2018-07-09 DIAGNOSIS — M1990 Unspecified osteoarthritis, unspecified site: Secondary | ICD-10-CM | POA: Diagnosis not present

## 2018-07-09 DIAGNOSIS — E7800 Pure hypercholesterolemia, unspecified: Secondary | ICD-10-CM | POA: Diagnosis not present

## 2018-07-09 DIAGNOSIS — Z79899 Other long term (current) drug therapy: Secondary | ICD-10-CM | POA: Diagnosis not present

## 2018-07-09 DIAGNOSIS — Z7982 Long term (current) use of aspirin: Secondary | ICD-10-CM | POA: Diagnosis not present

## 2018-07-09 DIAGNOSIS — E079 Disorder of thyroid, unspecified: Secondary | ICD-10-CM | POA: Diagnosis not present

## 2018-07-09 MED ORDER — METOPROLOL TARTRATE 25 MG PO TABS: 25.0000 mg | ORAL_TABLET | Freq: Two times a day (BID) | ORAL | 3 refills | Status: DC

## 2018-07-09 NOTE — Telephone Encounter (Signed)
Ok to fill. Thanks

## 2018-07-09 NOTE — Telephone Encounter (Signed)
Okay to refill this? Dr Tamala Julian has never refilled it? Please advise. Thanks, MI

## 2018-07-12 ENCOUNTER — Encounter (HOSPITAL_COMMUNITY)
Admission: RE | Admit: 2018-07-12 | Discharge: 2018-07-12 | Disposition: A | Discharge status: Home / Self Care | Payer: Medicare Other | Source: Ambulatory Visit | Attending: Interventional Cardiology | Admitting: Interventional Cardiology

## 2018-07-12 ENCOUNTER — Encounter (HOSPITAL_COMMUNITY): Payer: Medicare Other

## 2018-07-12 DIAGNOSIS — Z79899 Other long term (current) drug therapy: Secondary | ICD-10-CM | POA: Diagnosis not present

## 2018-07-12 DIAGNOSIS — E079 Disorder of thyroid, unspecified: Secondary | ICD-10-CM | POA: Diagnosis not present

## 2018-07-12 DIAGNOSIS — Z7982 Long term (current) use of aspirin: Secondary | ICD-10-CM | POA: Diagnosis not present

## 2018-07-12 DIAGNOSIS — E7800 Pure hypercholesterolemia, unspecified: Secondary | ICD-10-CM | POA: Diagnosis not present

## 2018-07-12 DIAGNOSIS — Z951 Presence of aortocoronary bypass graft: Secondary | ICD-10-CM | POA: Diagnosis not present

## 2018-07-12 DIAGNOSIS — M1990 Unspecified osteoarthritis, unspecified site: Secondary | ICD-10-CM | POA: Diagnosis not present

## 2018-07-14 ENCOUNTER — Encounter (HOSPITAL_COMMUNITY): Payer: Medicare Other

## 2018-07-14 ENCOUNTER — Encounter (HOSPITAL_COMMUNITY)
Admission: RE | Admit: 2018-07-14 | Discharge: 2018-07-14 | Disposition: A | Discharge status: Home / Self Care | Payer: Medicare Other | Source: Ambulatory Visit | Attending: Interventional Cardiology | Admitting: Interventional Cardiology

## 2018-07-14 DIAGNOSIS — Z79899 Other long term (current) drug therapy: Secondary | ICD-10-CM | POA: Diagnosis not present

## 2018-07-14 DIAGNOSIS — Z951 Presence of aortocoronary bypass graft: Secondary | ICD-10-CM

## 2018-07-14 DIAGNOSIS — Z7982 Long term (current) use of aspirin: Secondary | ICD-10-CM | POA: Diagnosis not present

## 2018-07-14 DIAGNOSIS — E7800 Pure hypercholesterolemia, unspecified: Secondary | ICD-10-CM | POA: Diagnosis not present

## 2018-07-14 DIAGNOSIS — E079 Disorder of thyroid, unspecified: Secondary | ICD-10-CM | POA: Diagnosis not present

## 2018-07-14 DIAGNOSIS — M1990 Unspecified osteoarthritis, unspecified site: Secondary | ICD-10-CM | POA: Diagnosis not present

## 2018-07-16 ENCOUNTER — Encounter (HOSPITAL_COMMUNITY): Payer: Medicare Other

## 2018-07-19 ENCOUNTER — Encounter (HOSPITAL_COMMUNITY): Payer: Medicare Other

## 2018-07-21 ENCOUNTER — Encounter (HOSPITAL_COMMUNITY): Payer: Medicare Other

## 2018-07-22 ENCOUNTER — Encounter (HOSPITAL_COMMUNITY): Payer: Self-pay | Admitting: *Deleted

## 2018-07-22 DIAGNOSIS — Z951 Presence of aortocoronary bypass graft: Secondary | ICD-10-CM

## 2018-07-22 NOTE — Progress Notes (Signed)
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  aspirin EC 325 MG EC tablet, Take 1 tablet (325 mg total) by mouth daily., Disp: 30 tablet, Rfl: 0 .  Calcium Carb-Cholecalciferol (CALCIUM+D3 PO), Take 1 tablet by mouth 2 (two) times daily after a meal., Disp: , Rfl:  .  Cholecalciferol (VITAMIN D-3) 1000 units CAPS, Take 1,000 Units by mouth at bedtime., Disp: , Rfl:  .  methimazole (TAPAZOLE) 5 MG tablet, Take 5 mg by mouth See admin instructions. Take 5 mg by mouth at bedtime on Mon/Tues/Wed/Thurs/Fri/Sat, Disp: , Rfl: 3 .  metoprolol tartrate (LOPRESSOR) 25 MG tablet, Take 1 tablet (25 mg total) by mouth 2 (two) times daily., Disp: 180 tablet, Rfl: 3 .  Multiple Vitamins-Calcium (ONE-A-DAY WOMENS FORMULA) TABS, Take 1 tablet by mouth daily., Disp: , Rfl:  .  pravastatin (PRAVACHOL) 40 MG tablet, Take 40 mg by mouth at bedtime., Disp: , Rfl:   Past Medical History: Past Medical History:  Diagnosis Date  . Arthritis    "qwhere" (02/26/2018)  . Cancer [facility])    "chest" (02/26/2018)  . Coronary artery disease   . Graves' disease   . High cholesterol   . Thyroid disease     Tobacco Use: Social History   Tobacco Use  Smoking Status Former Smoker  . Packs/day: 0.50  . Years: 30.00  . Pack years: 15.00  . Types: Cigarettes  Smokeless Tobacco Never Used  Tobacco Comment   02/26/2018 "nothing since before 2000"    Labs: Recent Review Flowsheet Data    Labs for ITP Cardiac and Pulmonary Rehab  Latest Ref Rng & Units 03/02/2018 03/02/2018 03/02/2018 03/02/2018 03/03/2018   Cholestrol 0 - 200 mg/dL - - - - -   LDLCALC 0 - 99 mg/dL - - - - -   HDL >40 mg/dL - - - - -   Trlycerides <150 mg/dL - - - - -   Hemoglobin A1c 4.8 - 5.6 % - - - - -   PHART 7.350 - 7.450 7.380 7.321(L) 7.313(L) - -   PCO2ART 32.0 - 48.0 mmHg 30.5(L) 39.4 41.4 - -   HCO3 20.0 - 28.0 mmol/L 18.3(L) 20.4 20.9 - -   TCO2 22 - 32 mmol/L 19(L) 22 22 21(L) 22   ACIDBASEDEF 0.0 - 2.0 mmol/L 6.0(H) 5.0(H) 5.0(H) - -   O2SAT % 99.0 99.0 98.0 - -      Capillary Blood Glucose: Lab Results  Component Value Date   GLUCAP 95 03/04/2018   GLUCAP 105 (H) 03/04/2018   GLUCAP 87 03/04/2018   GLUCAP 94 03/03/2018   GLUCAP 93 03/03/2018     Exercise Target Goals: Exercise Program Goal: Individual exercise prescription set using results from initial 6 min walk test and THRR while considering  patient's activity barriers and safety.   Exercise Prescription Goal: Initial exercise prescription builds to 30-45 minutes a day of aerobic activity, 2-3 days per week.  Home exercise guidelines will be given to  patient during program as part of exercise prescription that the participant will acknowledge.  Activity Barriers & Risk Stratification:   6 Minute Walk:   Oxygen Initial Assessment:   Oxygen Re-Evaluation:   Oxygen Discharge (Final Oxygen Re-Evaluation):   Initial Exercise Prescription:   Perform Capillary Blood Glucose checks as needed.  Exercise Prescription Changes: Exercise Prescription Changes    Row Name 05/26/18 0831 06/14/18 0954 07/12/18 0952         Response to Exercise   Blood Pressure (Admit)  112/70  118/78  102/72     Blood Pressure (Exercise)  116/60  128/64  104/62     Blood Pressure (Exit)  98/60  102/68  108/60     Heart Rate (Admit)  87 bpm  69 bpm  70 bpm     Heart Rate (Exercise)  93 bpm  91 bpm  90 bpm     Heart Rate (Exit)  63 bpm  69 bpm  70 bpm     Rating of Perceived Exertion  (Exercise)  11  11  12      Symptoms  none  none  none     Duration  Progress to 30 minutes of  aerobic without signs/symptoms of physical distress  Progress to 30 minutes of  aerobic without signs/symptoms of physical distress  Progress to 30 minutes of  aerobic without signs/symptoms of physical distress     Intensity  THRR unchanged  THRR unchanged  THRR unchanged       Progression   Progression  Continue to progress workloads to maintain intensity without signs/symptoms of physical distress.  Continue to progress workloads to maintain intensity without signs/symptoms of physical distress.  Continue to progress workloads to maintain intensity without signs/symptoms of physical distress.     Average METs  4.1  4  4.3       Resistance Training   Training Prescription  No Relaxation day, no weights  Yes  Yes     Weight  -  3lbs  3lbs     Reps  -  10-15  10-15     Time  -  10 Minutes  10 Minutes       Interval Training   Interval Training  No  No  No       Treadmill   MPH  2.6  2.6  2.6     Grade  1  1  2      Minutes  10  10  10      METs  3.35  3.35  3.71       Bike   Level  1  1  1      Minutes  10  10  10      METs  5.31  5.24  5.23       NuStep   Level  4  4  4      SPM  85  85  85     Minutes  10  10  10      METs  3.7  3.3  4       Home Exercise Plan   Plans to continue exercise at  [employer] (comment)  Forensic scientist (comment)  Forensic scientist (comment)     Frequency  Add 3 additional days to program exercise sessions.  Add 3 additional days to program exercise sessions.  Add 3 additional days to program exercise sessions.     Initial Home Exercises Provided  05/19/18  05/19/18  05/19/18  Exercise Comments: Exercise Comments    Row Name 05/26/18 0847 06/14/18 1000 07/12/18 1023       Exercise Comments  Reviewed METs and goals with patient.  Reviewed METs with patient.  Reviewed METs and goals with patient.        Exercise Goals and  Review:   Exercise Goals Re-Evaluation : Exercise Goals Re-Evaluation    Row Name 05/26/18 0847 06/30/18 1703 07/12/18 0952         Exercise Goal Re-Evaluation   Exercise Goals Review  Able to understand and use rate of perceived exertion (RPE) scale;Understanding of Exercise Prescription;Knowledge and understanding of Target Heart Rate Range (THRR);Increase Physical Activity  -  Able to understand and use rate of perceived exertion (RPE) scale;Understanding of Exercise Prescription;Knowledge and understanding of Target Heart Rate Range (THRR);Increase Physical Activity     Comments  Patinet is walking about 30 minutes daily and pariticpates in group fitness classes 3 days/week at [facility]. Pt also has access to fitness center at [facility].  Patient on vacation for 2 weeks, unable to review METs and goals.  Patient doing well with exercise, averaging 4.3 METs with exercise at cardiac rehab. Discussed increasing workloads to continue increasing fitness level.     Expected Outcomes  Patient will continue current exercise routine, increasing workloads as tolerated to help achieve personal health and fitness goals.  -  Continue to increase workloads to help improve cardiorespiratory fitness.        Discharge Exercise Prescription (Final Exercise Prescription Changes): Exercise Prescription Changes - 07/12/18 0952      Response to Exercise   Blood Pressure (Admit)  102/72    Blood Pressure (Exercise)  104/62    Blood Pressure (Exit)  108/60    Heart Rate (Admit)  70 bpm    Heart Rate (Exercise)  90 bpm    Heart Rate (Exit)  70 bpm    Rating of Perceived Exertion (Exercise)  12    Symptoms  none    Duration  Progress to 30 minutes of  aerobic without signs/symptoms of physical distress    Intensity  THRR unchanged      Progression   Progression  Continue to progress workloads to maintain intensity without signs/symptoms of physical distress.    Average METs  4.3      Resistance  Training   Training Prescription  Yes    Weight  3lbs    Reps  10-15    Time  10 Minutes      Interval Training   Interval Training  No      Treadmill   MPH  2.6    Grade  2    Minutes  10    METs  3.71      Bike   Level  1    Minutes  10    METs  5.23      NuStep   Level  4    SPM  85    Minutes  10    METs  4      Home Exercise Plan   Plans to continue exercise at  [employer] (comment)    Frequency  Add 3 additional days to program exercise sessions.    Initial Home Exercises Provided  05/19/18       Nutrition:  Target Goals: Understanding of nutrition guidelines, daily intake of sodium 1500mg , cholesterol 200mg , calories 30% from fat and 7% or less from saturated fats, daily to have 5 or  more servings of fruits and vegetables.  Biometrics:    Nutrition Therapy Plan and Nutrition Goals:   Nutrition Assessments:   Nutrition Goals Re-Evaluation:   Nutrition Goals Re-Evaluation:   Nutrition Goals Discharge (Final Nutrition Goals Re-Evaluation):   Psychosocial: Target Goals: Acknowledge presence or absence of significant depression and/or stress, maximize coping skills, provide positive support system. Participant is able to verbalize types and ability to use techniques and skills needed for reducing stress and depression.  Initial Review & Psychosocial Screening:   Quality of Life Scores:  Scores of 19 and below usually indicate a poorer quality of life in these areas.  A difference of  2-3 points is a clinically meaningful difference.  A difference of 2-3 points in the total score of the Quality of Life Index has been associated with significant improvement in overall quality of life, self-image, physical symptoms, and general health in studies assessing change in quality of life.  PHQ-9: Recent Review Flowsheet Data    Depression screen [facility] 2/9 05/10/2018   Decreased Interest 0   Down, Depressed, Hopeless 0   PHQ - 2 Score 0      Interpretation of Total Score  Total Score Depression Severity:  1-4 = Minimal depression, 5-9 = Mild depression, 10-14 = Moderate depression, 15-19 = Moderately severe depression, 20-27 = Severe depression   Psychosocial Evaluation and Intervention:   Psychosocial Re-Evaluation: Psychosocial Re-Evaluation    [place] Name 06/03/18 1144 07/01/18 1229 07/22/18 1128         Psychosocial Re-Evaluation   Current issues with  Current Stress Concerns  Current Stress Concerns  Current Stress Concerns     Comments  Aerika has not voiced any increased stressors at this time will continue to monitor.  Inayah has not voiced any increased stressors at this time will continue to monitor.  Hindy has not voiced any increased stressors at this time will continue to monitor.     Interventions  Stress management education;Encouraged to attend Cardiac Rehabilitation for the exercise  Stress management education;Encouraged to attend Cardiac Rehabilitation for the exercise  Stress management education;Encouraged to attend Cardiac Rehabilitation for the exercise     Comments  Husband is currently receiving cancer treatment  Husband is currently receiving cancer treatment  Husband is currently receiving cancer treatment       Initial Review   Source of Stress Concerns  Family  Family  Family        Psychosocial Discharge (Final Psychosocial Re-Evaluation): Psychosocial Re-Evaluation - 07/22/18 1128      Psychosocial Re-Evaluation   Current issues with  Current Stress Concerns    Comments  Yashira has not voiced any increased stressors at this time will continue to monitor.    Interventions  Stress management education;Encouraged to attend Cardiac Rehabilitation for the exercise    Comments  Husband is currently receiving cancer treatment      Initial Review   Source of Stress Concerns  Family       Vocational Rehabilitation: Provide vocational rehab assistance to qualifying candidates.   Vocational  Rehab Evaluation & Intervention:   Education: Education Goals: Education classes will be provided on a weekly basis, covering required topics. Participant will state understanding/return demonstration of topics presented.  Learning Barriers/Preferences:   Education Topics: Count Your Pulse:  -Group instruction provided by verbal instruction, demonstration, patient participation and written materials to support subject.  Instructors address importance of being able to find your pulse and how to count your pulse when at home  without a heart monitor.  Patients get hands on experience counting their pulse with staff help and individually.   Heart Attack, Angina, and Risk Factor Modification:  -Group instruction provided by verbal instruction, video, and written materials to support subject.  Instructors address signs and symptoms of angina and heart attacks.    Also discuss risk factors for heart disease and how to make changes to improve heart health risk factors.   CARDIAC REHAB PHASE II EXERCISE from 06/02/2018 in [facility]  Date  06/02/18  Instruction Review Code  2- Demonstrated Understanding      Functional Fitness:  -Group instruction provided by verbal instruction, demonstration, patient participation, and written materials to support subject.  Instructors address safety measures for doing things around the house.  Discuss how to get up and down off the floor, how to pick things up properly, how to safely get out of a chair without assistance, and balance training.   Meditation and Mindfulness:  -Group instruction provided by verbal instruction, patient participation, and written materials to support subject.  Instructor addresses importance of mindfulness and meditation practice to help reduce stress and improve awareness.  Instructor also leads participants through a meditation exercise.    Stretching for Flexibility and Mobility:  -Group  instruction provided by verbal instruction, patient participation, and written materials to support subject.  Instructors lead participants through series of stretches that are designed to increase flexibility thus improving mobility.  These stretches are additional exercise for major muscle groups that are typically performed during regular warm up and cool down.   Hands Only CPR:  -Group verbal, video, and participation provides a basic overview of AHA guidelines for community CPR. Role-play of emergencies allow participants the opportunity to practice calling for help and chest compression technique with discussion of AED use.   Hypertension: -Group verbal and written instruction that provides a basic overview of hypertension including the most recent diagnostic guidelines, risk factor reduction with self-care instructions and medication management.    Nutrition I class: Heart Healthy Eating:  -Group instruction provided by PowerPoint slides, verbal discussion, and written materials to support subject matter. The instructor gives an explanation and review of the Therapeutic Lifestyle Changes diet recommendations, which includes a discussion on lipid goals, dietary fat, sodium, fiber, plant stanol/sterol esters, sugar, and the components of a well-balanced, healthy diet.   Nutrition II class: Lifestyle Skills:  -Group instruction provided by PowerPoint slides, verbal discussion, and written materials to support subject matter. The instructor gives an explanation and review of label reading, grocery shopping for heart health, heart healthy recipe modifications, and ways to make healthier choices when eating out.   Diabetes Question & Answer:  -Group instruction provided by PowerPoint slides, verbal discussion, and written materials to support subject matter. The instructor gives an explanation and review of diabetes co-morbidities, pre- and post-prandial blood glucose goals, pre-exercise blood  glucose goals, signs, symptoms, and treatment of hypoglycemia and hyperglycemia, and foot care basics.   Diabetes Blitz:  -Group instruction provided by PowerPoint slides, verbal discussion, and written materials to support subject matter. The instructor gives an explanation and review of the physiology behind type 1 and type 2 diabetes, diabetes medications and rational behind using different medications, pre- and post-prandial blood glucose recommendations and Hemoglobin A1c goals, diabetes diet, and exercise including blood glucose guidelines for exercising safely.    Portion Distortion:  -Group instruction provided by PowerPoint slides, verbal discussion, written materials, and food models to support subject  matter. The instructor gives an explanation of serving size versus portion size, changes in portions sizes over the last 20 years, and what consists of a serving from each food group.   Stress Management:  -Group instruction provided by verbal instruction, video, and written materials to support subject matter.  Instructors review role of stress in heart disease and how to cope with stress positively.     Exercising on Your Own:  -Group instruction provided by verbal instruction, power point, and written materials to support subject.  Instructors discuss benefits of exercise, components of exercise, frequency and intensity of exercise, and end points for exercise.  Also discuss use of nitroglycerin and activating EMS.  Review options of places to exercise outside of rehab.  Review guidelines for sex with heart disease.   Cardiac Drugs I:  -Group instruction provided by verbal instruction and written materials to support subject.  Instructor reviews cardiac drug classes: antiplatelets, anticoagulants, beta blockers, and statins.  Instructor discusses reasons, side effects, and lifestyle considerations for each drug class.   CARDIAC REHAB PHASE II EXERCISE from 06/02/2018 in [place]  Date  05/12/18  Instruction Review Code  2- Demonstrated Understanding      Cardiac Drugs II:  -Group instruction provided by verbal instruction and written materials to support subject.  Instructor reviews cardiac drug classes: angiotensin converting enzyme inhibitors (ACE-I), angiotensin II receptor blockers (ARBs), nitrates, and calcium channel blockers.  Instructor discusses reasons, side effects, and lifestyle considerations for each drug class.   Anatomy and Physiology of the Circulatory System:  Group verbal and written instruction and models provide basic cardiac anatomy and physiology, with the coronary electrical and arterial systems. Review of: AMI, Angina, Valve disease, Heart Failure, Peripheral Artery Disease, Cardiac Arrhythmia, Pacemakers, and the ICD.   CARDIAC REHAB PHASE II EXERCISE from 06/02/2018 in [place]  Date  05/26/18  Educator  RN  Instruction Review Code  2- Demonstrated Understanding      Other Education:  -Group or individual verbal, written, or video instructions that support the educational goals of the cardiac rehab program.   Holiday Eating Survival Tips:  -Group instruction provided by PowerPoint slides, verbal discussion, and written materials to support subject matter. The instructor gives patients tips, tricks, and techniques to help them not only survive but enjoy the holidays despite the onslaught of food that accompanies the holidays.   Knowledge Questionnaire Score:   Core Components/Risk Factors/Patient Goals at Admission:   Core Components/Risk Factors/Patient Goals Review:  Goals and Risk Factor Review    Row Name 06/03/18 1145 07/01/18 1229 07/22/18 1129         Core Components/Risk Factors/Patient Goals Review   Personal Goals Review  Weight Management/Obesity;Stress;Lipids  Weight Management/Obesity;Stress;Lipids  Weight Management/Obesity;Stress;Lipids      Review  Storm's vital signs have been stable. Calah has maintained her current weight.  Quinlan's vital signs have been stable. Anatasia has maintained her current weight.  Jeanae's vital signs have been stable. Rosmary has maintained her current weight. Jency is currently on vacation in [place]     Expected Phoenix will continue to participate in phase 2 cardiac rehab for exercise, nutrtion and lifestyle modfication opportunites.  Bronte will continue to participate in phase 2 cardiac rehab for exercise, nutrtion and lifestyle modfication opportunites.  Brinlee will continue to participate in phase 2 cardiac rehab for exercise, nutrtion and lifestyle modfication opportunites.        Core  Components/Risk Factors/Patient Goals at Discharge (Final Review):  Goals and Risk Factor Review - 07/22/18 1129      Core Components/Risk Factors/Patient Goals Review   Personal Goals Review  Weight Management/Obesity;Stress;Lipids    Review  Tamyia's vital signs have been stable. Blakleigh has maintained her current weight. Wonda is currently on vacation in [place]    Expected Barnard will continue to participate in phase 2 cardiac rehab for exercise, nutrtion and lifestyle modfication opportunites.       ITP Comments: ITP Comments    Row Name 06/03/18 1142 07/01/18 1227 07/22/18 1127 07/22/18 1130     ITP Comments  30 Day ITP comments. Patinet with good participation and attendance in phase 2 cardiac rehab  30 Day ITP comments. Patinet with good participation in phase 2 cardiac rehab. Kurt last day of participation was 06/16/18. Tamar is on vacation in [place]  30 Day ITP comments. Patinet with good participation in phase 2 cardiac rehab. Ronda last day of participation was 07/19/18. Ashli is on vacation in [place]  30 Day ITP comments. Patinet with good participation in phase 2 cardiac rehab. Suezette last day of participation was 07/14/18. Bailley is on vacation in [place]       Comments: See ITP  comments.Barnet Pall, RN,BSN 07/22/2018 11:33 AM

## 2018-07-23 ENCOUNTER — Encounter (HOSPITAL_COMMUNITY): Payer: Medicare Other

## 2018-07-26 ENCOUNTER — Telehealth: Payer: Self-pay | Admitting: Interventional Cardiology

## 2018-07-26 ENCOUNTER — Encounter (HOSPITAL_COMMUNITY): Payer: Medicare Other

## 2018-07-26 ENCOUNTER — Encounter (HOSPITAL_COMMUNITY)
Admission: RE | Admit: 2018-07-26 | Discharge: 2018-07-26 | Disposition: A | Discharge status: Home / Self Care | Payer: Medicare Other | Source: Ambulatory Visit | Attending: Interventional Cardiology | Admitting: Interventional Cardiology

## 2018-07-26 DIAGNOSIS — E7800 Pure hypercholesterolemia, unspecified: Secondary | ICD-10-CM | POA: Diagnosis not present

## 2018-07-26 DIAGNOSIS — E079 Disorder of thyroid, unspecified: Secondary | ICD-10-CM | POA: Diagnosis not present

## 2018-07-26 DIAGNOSIS — Z951 Presence of aortocoronary bypass graft: Secondary | ICD-10-CM

## 2018-07-26 DIAGNOSIS — Z79899 Other long term (current) drug therapy: Secondary | ICD-10-CM | POA: Diagnosis not present

## 2018-07-26 DIAGNOSIS — M1990 Unspecified osteoarthritis, unspecified site: Secondary | ICD-10-CM | POA: Diagnosis not present

## 2018-07-26 DIAGNOSIS — Z7982 Long term (current) use of aspirin: Secondary | ICD-10-CM | POA: Diagnosis not present

## 2018-07-26 NOTE — Telephone Encounter (Signed)
  Patient will have her graduation from cardiac rehab Wednesday, 07/28/18 because she will be out of town most of December. Patient was told by rehab place to let Dr Tamala Julian know because he needs to clear the patient to end early.

## 2018-07-26 NOTE — Telephone Encounter (Signed)
Okay to end rehab.

## 2018-07-27 NOTE — Telephone Encounter (Signed)
Cardiac rehab made aware

## 2018-07-28 ENCOUNTER — Encounter (HOSPITAL_COMMUNITY): Payer: Medicare Other

## 2018-07-28 ENCOUNTER — Encounter (HOSPITAL_COMMUNITY)
Admission: RE | Admit: 2018-07-28 | Discharge: 2018-07-28 | Disposition: A | Discharge status: Home / Self Care | Payer: Medicare Other | Source: Ambulatory Visit | Attending: Interventional Cardiology | Admitting: Interventional Cardiology

## 2018-07-28 VITALS — BP 120/71 | HR 64 | Ht 63.5 in | Wt 98.1 lb

## 2018-07-28 DIAGNOSIS — M1990 Unspecified osteoarthritis, unspecified site: Secondary | ICD-10-CM | POA: Diagnosis not present

## 2018-07-28 DIAGNOSIS — E079 Disorder of thyroid, unspecified: Secondary | ICD-10-CM | POA: Diagnosis not present

## 2018-07-28 DIAGNOSIS — Z951 Presence of aortocoronary bypass graft: Secondary | ICD-10-CM | POA: Diagnosis not present

## 2018-07-28 DIAGNOSIS — Z7982 Long term (current) use of aspirin: Secondary | ICD-10-CM | POA: Diagnosis not present

## 2018-07-28 DIAGNOSIS — Z79899 Other long term (current) drug therapy: Secondary | ICD-10-CM | POA: Diagnosis not present

## 2018-07-28 DIAGNOSIS — E7800 Pure hypercholesterolemia, unspecified: Secondary | ICD-10-CM | POA: Diagnosis not present

## 2018-07-28 NOTE — Progress Notes (Signed)
Discharge Progress Report  Patient Details  Name: Deborah [name] MRN: [id number] Date of Birth: 1945/12/05 Referring Provider:     [place] from 05/04/2018 in [place]  Referring Provider  Daneen Schick, MD       Number of Visits: 19  Reason for Discharge:  Patient independent in their exercise.  Smoking History:  Social History   Tobacco Use  Smoking Status Former Smoker  . Packs/day: 0.50  . Years: 30.00  . Pack years: 15.00  . 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  ADL [facility]:   Initial Exercise Prescription: Initial Exercise Prescription - 05/04/18 0900      Date of Initial Exercise RX and Referring Provider   Date  05/04/18    Referring Provider  Daneen Schick, MD      Treadmill   MPH  2.5    Grade  1    Minutes  10    METs  3.26      Bike   Level  0.7    Minutes  10    METs  3.94      NuStep   Level  4    SPM  85    Minutes  10    METs  3      Prescription Details   Frequency (times per week)  3    Duration  Progress to 30 minutes of continuous aerobic without signs/symptoms of physical distress      Intensity   THRR 40-80% of Max Heartrate  59-118    Ratings of Perceived Exertion  11-13    Perceived Dyspnea  0-4      Progression   Progression  Continue to progress workloads to maintain intensity without signs/symptoms of physical distress.      Resistance Training   Training Prescription  Yes    Weight  3lbs    Reps  10-15       Discharge Exercise Prescription (Final Exercise Prescription Changes): Exercise Prescription Changes - 07/28/18 0954      Response to Exercise   Blood Pressure (Admit)  120/71    Blood Pressure (Exercise)  168/76    Blood Pressure (Exit)  108/71    Heart Rate (Admit)  64 bpm    Heart Rate (Exercise)  108 bpm    Heart Rate (Exit)  63 bpm     Rating of Perceived Exertion (Exercise)  12    Symptoms  none    Duration  Progress to 30 minutes of  aerobic without signs/symptoms of physical distress    Intensity  THRR unchanged      Progression   Progression  Continue to progress workloads to maintain intensity without signs/symptoms of physical distress.    Average METs  4.7      Resistance Training   Training Prescription  No   Relaxation day, no weights.     Interval Training   Interval Training  No      Treadmill   MPH  --    Grade  --    Minutes  --    METs  --      Bike   Level  1    Minutes  10    METs  5.24      NuStep   Level  4    SPM  85  Minutes  10    METs  4.4      Track   Laps  11   2280f 6MWT   Minutes  6    METs  4.33      [facility] Exercise Plan   [facility]    Frequency  Add 3 additional days to program exercise sessions.    Initial Home Exercises Provided  05/19/18       Functional Capacity: 6 Minute Walk    Row Name 05/04/18 0840 07/28/18 0956       6 Minute Walk   Phase  Initial  Discharge    Distance  1866 feet  2296 feet    Distance % Change  -  23.04 %    Distance Feet Change  -  430 ft    Walk Time  6 minutes  6 minutes    # of Rest Breaks  0  0    MPH  3.53  4.35    METS  4.09  5.32    RPE  11  11    Perceived Dyspnea   -  0    VO2 Peak  14.31  18.62    Symptoms  No  No    Resting HR  65 bpm  64 bpm    Resting BP  104/60  120/71    Resting Oxygen Saturation   98 %  -    Exercise Oxygen Saturation  during 6 min walk  99 %  -    Max Ex. HR  93 bpm  108 bpm    Max Ex. BP  124/72  168/76    2 Minute Post BP  122/62  108/71       Psychological, QOL, Others - Outcomes: PH[facility] 07/28/2018 05/10/2018  Decreased Interest 0 0  Down, Depressed, Hopeless 0 0  PHQ - 2 Score 0 0    Quality of Life: Quality of Life - 07/28/18 1120      Quality of Life   Select  Quality of Life      Quality  of Life Scores   Health/Function Pre  25.07 %    Health/Function Post  28.86 %    Health/Function % Change  15.12 %    Socioeconomic Pre  27.14 %    Socioeconomic Post  28.93 %    Socioeconomic % Change   6.6 %    Psych/Spiritual Pre  28.57 %    Psych/Spiritual Post  27.86 %    Psych/Spiritual % Change  -2.49 %    Family Pre  28.5 %    Family Post  27 %    Family % Change  -5.26 %    GLOBAL Pre  26.67 %    GLOBAL Post  28.42 %    GLOBAL % Change  6.56 %       Personal Goals: Goals established at orientation with interventions provided to work toward goal. Personal Goals and Risk Factors at Admission - 05/04/18 0954      Core Components/Risk Factors/Patient Goals on Admission    Weight Management  Weight Gain;Yes    Intervention  Weight Management: Develop a combined nutrition and exercise program designed to reach desired caloric intake, while maintaining appropriate intake of nutrient and fiber, sodium and fats, and appropriate energy expenditure required for the weight goal.;Weight Management: Provide education and appropriate resources to help participant work on and attain dietary goals.  Admit Weight  97 lb 3.6 oz (44.1 kg)    Expected Outcomes  Short Term: Continue to assess and modify interventions until short term weight is achieved;Long Term: Adherence to nutrition and physical activity/exercise program aimed toward attainment of established weight goal;Understanding recommendations for meals to include 15-35% energy as protein, 25-35% energy from fat, 35-60% energy from carbohydrates, less than 252m of dietary cholesterol, 20-35 gm of total fiber daily;Understanding of distribution of calorie intake throughout the day with the consumption of 4-5 meals/snacks;Weight Gain: Understanding of general recommendations for a high calorie, high protein meal plan that promotes weight gain by distributing calorie intake throughout the day with the consumption for 4-5 meals, snacks,  and/or supplements    Lipids  Yes    Intervention  Provide education and support for participant on nutrition & aerobic/resistive exercise along with prescribed medications to achieve LDL <777m HDL >4061m   Expected Outcomes  Short Term: Participant states understanding of desired cholesterol values and is compliant with medications prescribed. Participant is following exercise prescription and nutrition guidelines.;Long Term: Cholesterol controlled with medications as prescribed, with individualized exercise RX and with personalized nutrition plan. Value goals: LDL < 21m3mDL > 40 mg.    Stress  Yes    Intervention  Offer individual and/or small group education and counseling on adjustment to heart disease, stress management and health-related lifestyle change. Teach and support self-help strategies.;Refer participants experiencing significant psychosocial distress to appropriate mental health specialists for further evaluation and treatment. When possible, include family members and significant others in education/counseling sessions.    Expected Outcomes  Short Term: Participant demonstrates changes in health-related behavior, relaxation and other stress management skills, ability to obtain effective social support, and compliance with psychotropic medications if prescribed.;Long Term: Emotional wellbeing is indicated by absence of clinically significant psychosocial distress or social isolation.        Personal Goals Discharge: Goals and Risk Factor Review    Row Name 06/03/18 1145 07/01/18 1229 07/22/18 1129 07/28/18 1104       Core Components/Risk Factors/Patient Goals Review   Personal Goals Review  Weight Management/Obesity;Stress;Lipids  Weight Management/Obesity;Stress;Lipids  Weight Management/Obesity;Stress;Lipids  Weight Management/Obesity;Stress;Lipids    Review  Deborah [name]'s vital signs have been stable. NancAarti maintained her current weight.  Deborah [name]'s vital signs have been stable.  NancTalah maintained her current weight.  Seraya's vital signs have been stable. NancJemma maintained her current weight. [place] been [place]. NancDeionna maintained her current weight but has not gained any weight like she wanted to.    Expected Outcomes  NancEmelynl continue to participate in phase 2 cardiac rehab for exercise, nutrtion and lifestyle modfication opportunites.  NancKathal continue to participate in phase 2 cardiac rehab for exercise, nutrtion and lifestyle modfication opportunites.  NancHilleryl continue to participate in phase 2 cardiac rehab for exercise, nutrtion and lifestyle modfication opportunites.  [facility]lal [facility], nutrtion and lifestyle modfication opportunites.       Exercise Goals and Review: Exercise Goals    Row Name 05/04/18 0853             Exercise Goals   Increase Physical Activity  Yes       Intervention  Provide advice, education, support and counseling about physical activity/exercise needs.;Develop an individualized exercise prescription for aerobic and resistive training based on initial evaluation findings, risk stratification, comorbidities and  participant's personal goals.       Expected Outcomes  Short Term: Attend rehab on a regular basis to increase amount of physical activity.;Long Term: Exercising regularly at least 3-5 days a week.;Long Term: Add in home exercise to make exercise part of routine and to increase amount of physical activity.       Increase Strength and Stamina  Yes       Intervention  Provide advice, education, support and counseling about physical activity/exercise needs.;Develop an individualized exercise prescription for aerobic and resistive training based on initial evaluation findings, risk stratification, comorbidities and participant's personal goals.       Expected Outcomes  Short Term: Increase  workloads from initial exercise prescription for resistance, speed, and METs.;Short Term: Perform resistance training exercises routinely during rehab and add in resistance training at home;Long Term: Improve cardiorespiratory fitness, muscular endurance and strength as measured by increased METs and functional capacity (6MWT)       Able to understand and use rate of perceived exertion (RPE) scale  Yes       Intervention  Provide education and explanation on how to use RPE scale       Expected Outcomes  Short Term: Able to use RPE daily in rehab to express subjective intensity level;Long Term:  Able to use RPE to guide intensity level when exercising independently       Knowledge and understanding of Target Heart Rate Range (THRR)  Yes       Intervention  Provide education and explanation of THRR including how the numbers were predicted and where they are located for reference       Expected Outcomes  Short Term: Able to state/look up THRR;Long Term: Able to use THRR to govern intensity when exercising independently;Short Term: Able to use daily as guideline for intensity in rehab       Able to check pulse independently  Yes       Intervention  Provide education and demonstration on how to check pulse in carotid and radial arteries.;Review the importance of being able to check your own pulse for safety during independent exercise       Expected Outcomes  Short Term: Able to explain why pulse checking is important during independent exercise;Long Term: Able to check pulse independently and accurately       Understanding of Exercise Prescription  Yes       Intervention  Provide education, explanation, and written materials on patient's individual exercise prescription       Expected Outcomes  Short Term: Able to explain program exercise prescription;Long Term: Able to explain home exercise prescription to exercise independently          Exercise Goals Re-Evaluation: Exercise Goals Re-Evaluation     Row Name 05/10/18 1044 05/19/18 1009 05/26/18 0847 06/30/18 1703 07/12/18 0952     Exercise Goal Re-Evaluation   Exercise Goals Review  Able to understand and use rate of perceived exertion (RPE) scale;Increase Physical Activity  Able to understand and use rate of perceived exertion (RPE) scale;Understanding of Exercise Prescription;Knowledge and understanding of Target Heart Rate Range (THRR)  Able to understand and use rate of perceived exertion (RPE) scale;Understanding of Exercise Prescription;Knowledge and understanding of Target Heart Rate Range (THRR);Increase Physical Activity  -  Able to understand and use rate of perceived exertion (RPE) scale;Understanding of Exercise Prescription;Knowledge and understanding of Target Heart Rate Range (THRR);Increase Physical Activity   Comments  Patient able to understand and use RPE scale appropriately.  Reviewed home  exercise guidelines with patient including THRR, RPE scale and endpoints for exercise. Pt is currently participating in a blance class at [facility] on Tuesdays and Thursdays in addition to exercise at [facility]. Pt is also walking for her exericse.  Patinet is walking about 30 minutes daily and pariticpates in group fitness classes 3 days/week at [facility]. Pt also has access to fitness center at [facility].  Patient on vacation for 2 weeks, unable to review METs and goals.  Patient doing well with exercise, averaging 4.3 METs with exercise at cardiac rehab. Discussed increasing workloads to continue increasing fitness level.   Expected Outcomes  Increase workloads as tolerated to help achieve personal health and fitness goals.  Patient will continue exercise at least 5 days/week to help improve cardiorespiratory fitness.  Patient will continue current exercise routine, increasing workloads as tolerated to help achieve personal health and fitness goals.  -  Continue to increase workloads to help improve cardiorespiratory fitness.   Friend Name 07/28/18  1045             Exercise Goal Re-Evaluation   Exercise Goals Review  Able to understand and use rate of perceived exertion (RPE) scale;Understanding of Exercise Prescription;Knowledge and understanding of Target Heart Rate Range (THRR);Increase Physical Activity;Able to check pulse independently       Comments  Patient graduating early from cardiac rehab, and has made excellent progress. Pt's functional capacity improved 23% as measured by 6MWT. Reviewed exercise prescription, and pt will continue to exercise at [facility] where she has access to a gym and exercise classes. Pt plans to 30-60 minutes at least 5 days/week.       Expected Outcomes  Patient will exercise 30-60 miuntes at least 5 days/week to maintain health and fitness gains.          Nutrition & Weight - Outcomes: Pre Biometrics - 05/04/18 0803      Pre Biometrics   Height  5' 3.5" (1.613 m)    Weight  97 lb 3.6 oz (44.1 kg)    Waist Circumference  24 inches    Hip Circumference  32.25 inches    Waist to Hip Ratio  0.74 %    BMI (Calculated)  16.95    Triceps Skinfold  8 mm    % Body Fat  23.7 %    Grip Strength  24 kg    Flexibility  12 in    Single Leg Stand  30 seconds      Post Biometrics - 07/28/18 1045       Post  Biometrics   Height  5' 3.5" (1.613 m)    Weight  98 lb 1.7 oz (44.5 kg)    Waist Circumference  24.25 inches    Hip Circumference  32.25 inches    Waist to Hip Ratio  0.75 %    BMI (Calculated)  17.1    Triceps Skinfold  9 mm    % Body Fat  24.4 %    Grip Strength  23.5 kg    Flexibility  13 in    Single Leg Stand  28.32 seconds       Nutrition: Nutrition Therapy & Goals - 08/06/18 0900      Nutrition Therapy   Diet  heart healthy      Personal Nutrition Goals   Nutrition Goal  pt will increase the number of meals and snacks consumed daily      Intervention Plan   Intervention  Prescribe, educate  and counsel regarding individualized specific dietary modifications aiming  towards targeted core components such as weight, hypertension, lipid management, diabetes, heart failure and other comorbidities.    Expected Outcomes  Short Term Goal: Understand basic principles of dietary content, such as calories, fat, sodium, cholesterol and nutrients.       Nutrition Discharge: Nutrition Assessments - 08/06/18 0900      MEDFICTS Scores   Pre Score  36    Post Score  21    Score Difference  -15       Education Questionnaire Score: Knowledge Questionnaire Score - 07/28/18 0945      Knowledge Questionnaire Score   Pre Score  20/24    Post Score  23/24       Goals reviewed with patient; copy given to patient. Deborah [name] graduated from cardiac rehab program 07/28/18 with completion of 19 exercise sessions in Phase II. Pt maintained good attendance and progressed nicely during his participation in rehab as evidenced by increased MET level.   Medication list reconciled. Repeat  PHQ score-0  .  Pt has made significant lifestyle changes and should be commended for her success. Pt feels she has achieved her goals during cardiac rehab.   Pt plans to continue exercise at [facility]. We are proud of Deborah [name]'s progress. Deborah [name] increased her distance on her post exercise walk test and gained one pound. Weight gain was a goal for [employer].Deborah Pall, RN,BSN 08/10/2018 2:18 PM

## 2018-07-30 ENCOUNTER — Encounter (HOSPITAL_COMMUNITY): Payer: Medicare Other

## 2018-08-02 ENCOUNTER — Encounter (HOSPITAL_COMMUNITY): Payer: Medicare Other

## 2018-08-04 ENCOUNTER — Encounter (HOSPITAL_COMMUNITY): Payer: Medicare Other

## 2018-08-06 ENCOUNTER — Encounter (HOSPITAL_COMMUNITY): Payer: Medicare Other

## 2018-08-09 ENCOUNTER — Encounter (HOSPITAL_COMMUNITY): Payer: Medicare Other

## 2018-08-11 ENCOUNTER — Encounter (HOSPITAL_COMMUNITY): Payer: Medicare Other

## 2018-08-17 DIAGNOSIS — Z85828 Personal history of other malignant neoplasm of skin: Secondary | ICD-10-CM | POA: Diagnosis not present

## 2018-08-17 DIAGNOSIS — L570 Actinic keratosis: Secondary | ICD-10-CM | POA: Diagnosis not present

## 2018-08-17 DIAGNOSIS — L821 Other seborrheic keratosis: Secondary | ICD-10-CM | POA: Diagnosis not present

## 2018-08-17 DIAGNOSIS — D1801 Hemangioma of skin and subcutaneous tissue: Secondary | ICD-10-CM | POA: Diagnosis not present

## 2018-08-20 IMAGING — CT CT ANGIO CHEST-ABD-PELV FOR DISSECTION W/ AND WO/W CM
2 of 7 series · 12 of 46 positions shown, 14 images · IV contrast (iopamidol)
Comparison: 05/18/2017 chest CT without contrast

CLINICAL DATA: Chest pressure today

EXAM:
CT ANGIOGRAPHY CHEST, ABDOMEN AND PELVIS
TECHNIQUE: Multidetector CT imaging through the chest, abdomen and pelvis was
performed using the standard protocol during bolus administration of
intravenous contrast. Multiplanar reconstructed images and MIPs were
obtained and reviewed to evaluate the vascular anatomy.
CONTRAST:  100mL [id number] IOPAMIDOL ([id number]) INJECTION 76%

[Series 8: dissection 2mm · axial · 0.69mm/px · z∈[+793,+1315]mm · 9 of 295 slices shown, 11 images]
[im 17/295  soft-tissue]
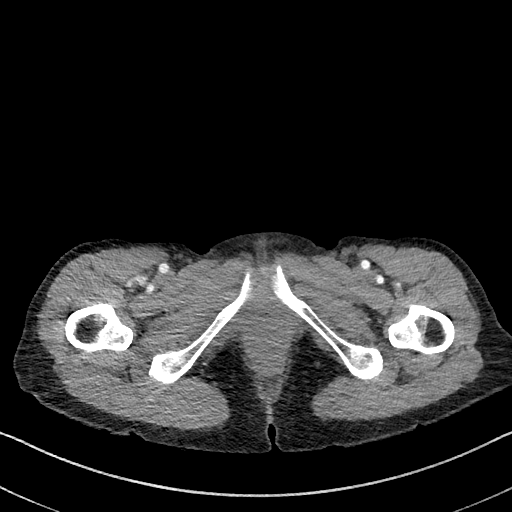
[im 17/295  bone]
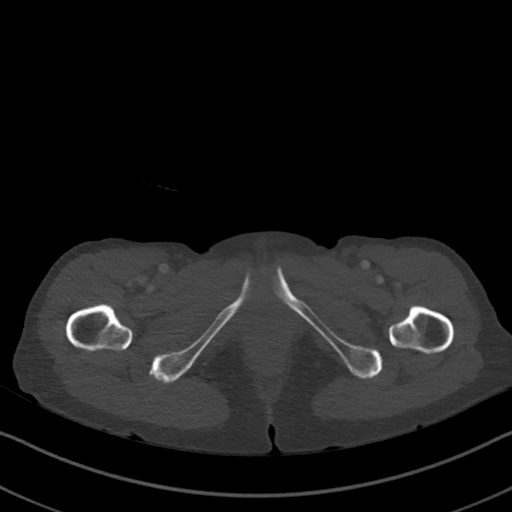
[im 50/295  soft-tissue]
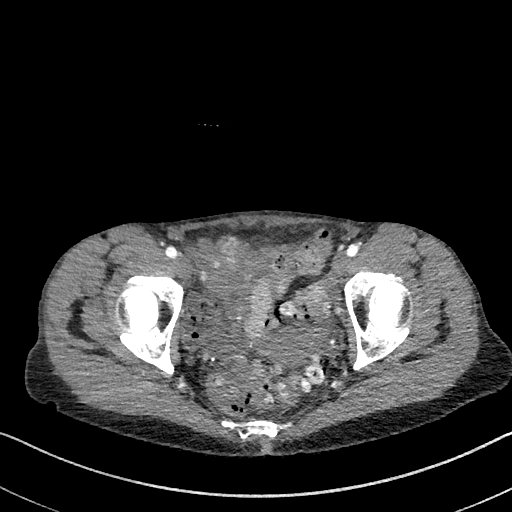
[im 82/295  soft-tissue]
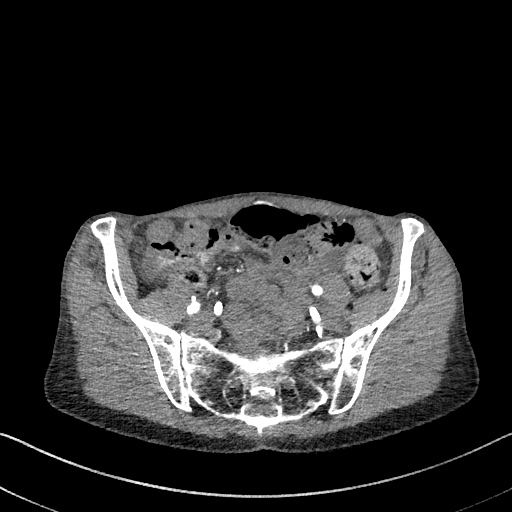
[im 115/295  soft-tissue]
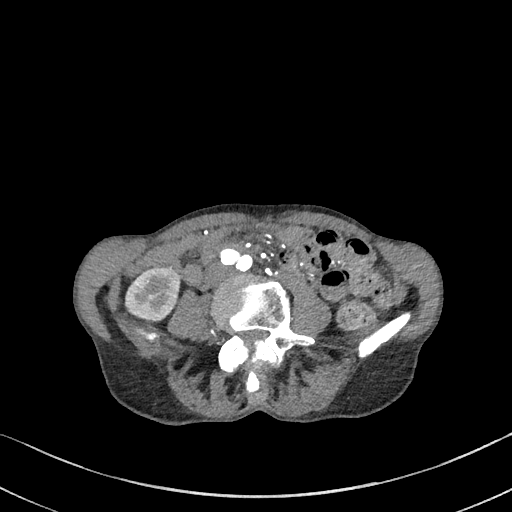
[im 148/295  soft-tissue]
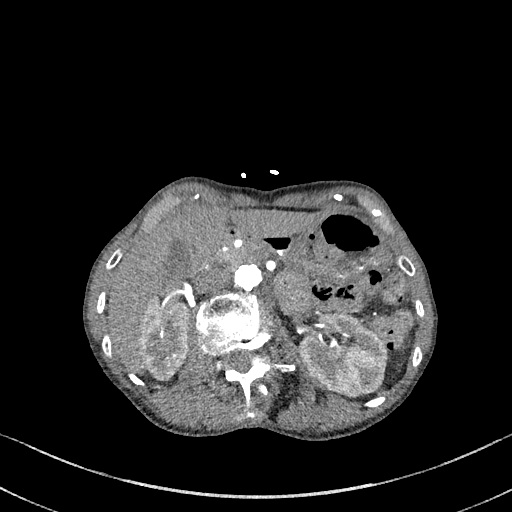
[im 180/295  soft-tissue]
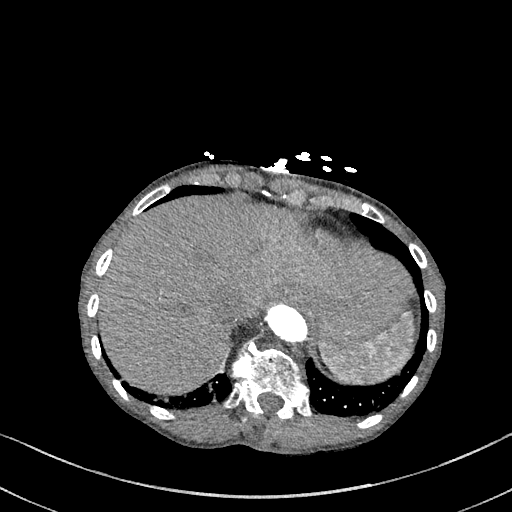
[im 213/295  soft-tissue]
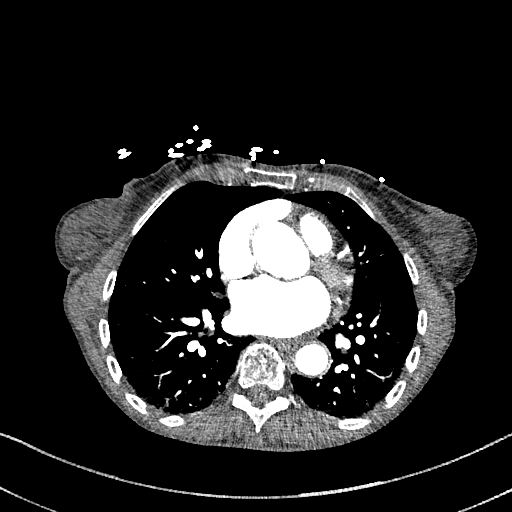
[im 245/295  soft-tissue]
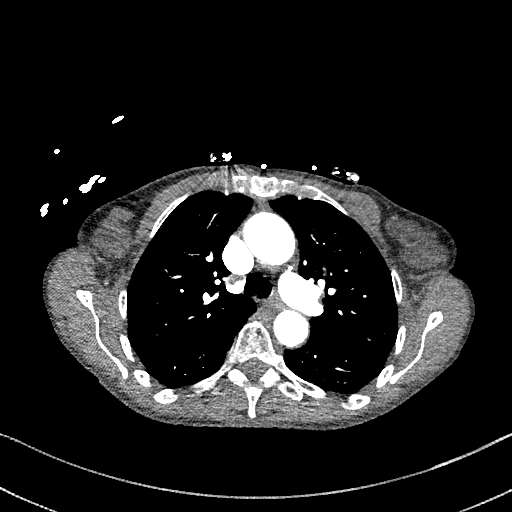
[im 278/295  soft-tissue]
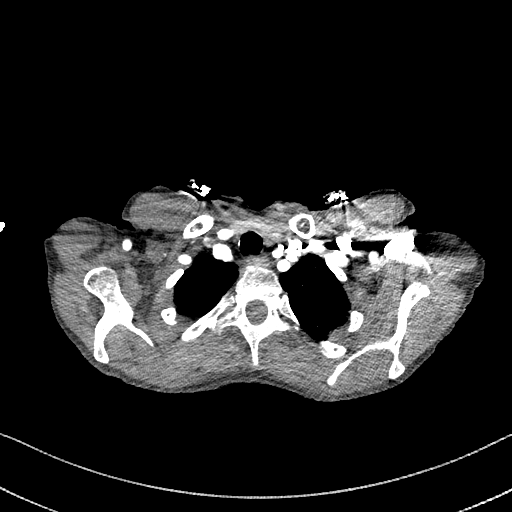
[im 278/295  bone]
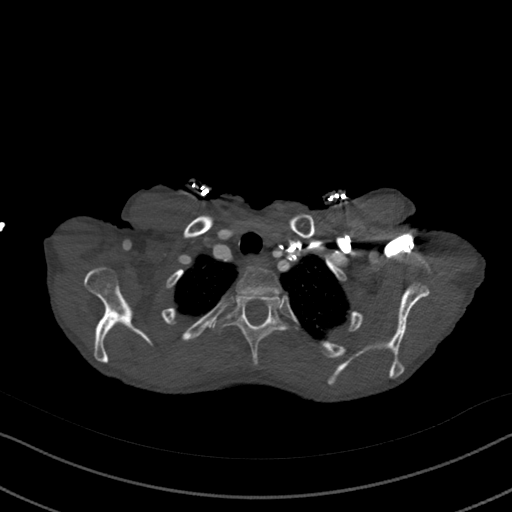

[Series 11: dissection 2mm cor · coronal · 0.55mm/px · 3 of 111 slices shown]
[im 28/111  soft-tissue]
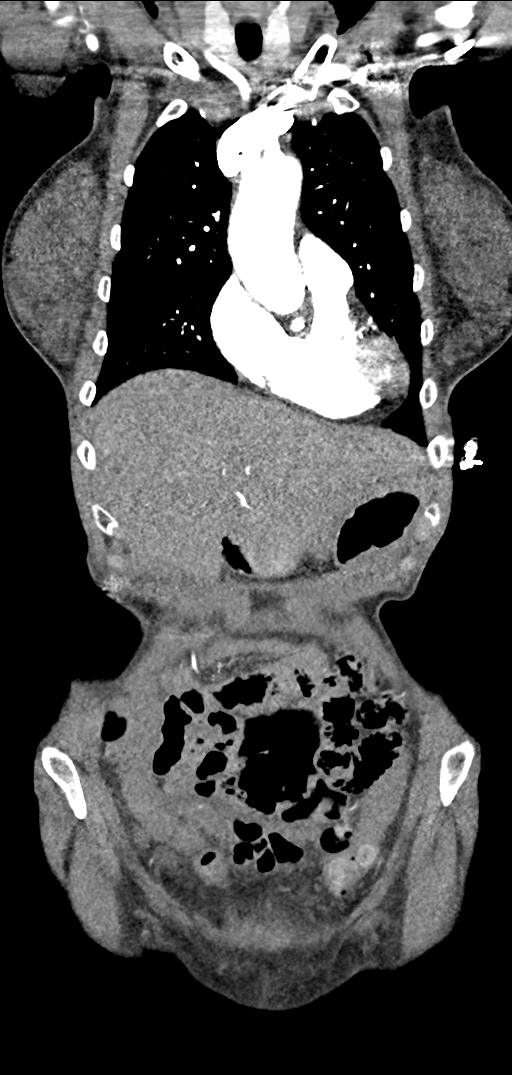
[im 56/111  soft-tissue]
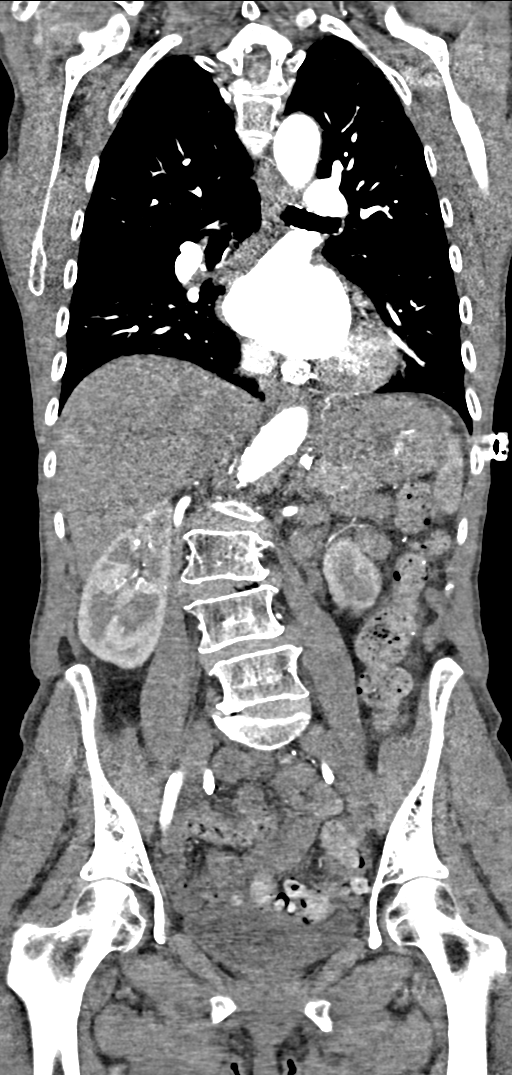
[im 83/111  soft-tissue]
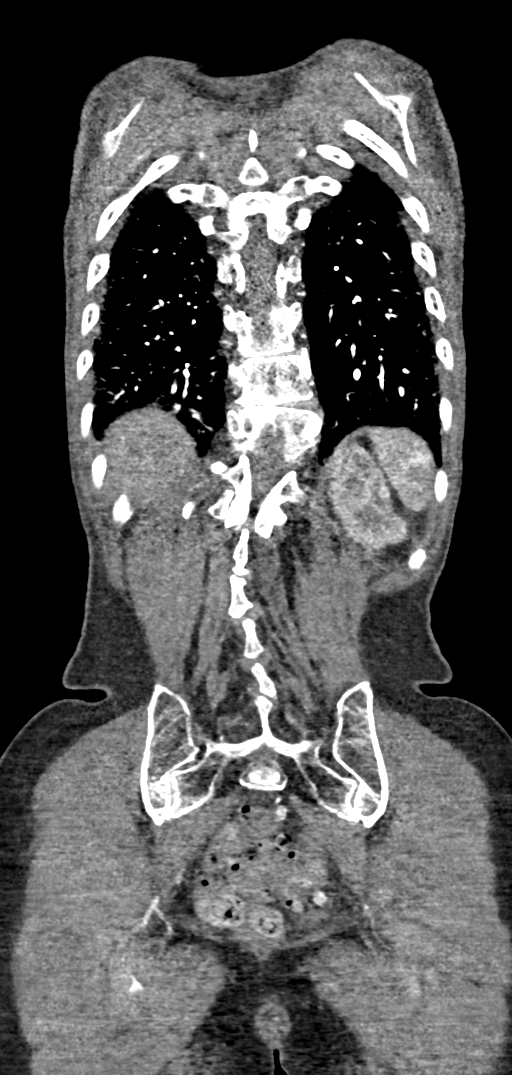

[12 of 46 positions shown; findings below may reference images not displayed]

FINDINGS: CTA CHEST FINDINGS

Cardiovascular: There is no evidence of aortic dissection or acute
intramural hematoma. Maximal diameter of the ascending aorta is
cm. Atherosclerotic calcifications of the aortic arch and great
vessels are noted. Moderate 3 vessel coronary artery calcification.
Great vessels are patent. Vertebral arteries are patent there is no
obvious evidence of acute pulmonary thromboembolism.

Mediastinum/Nodes: Visualized thyroid is unremarkable. No abnormal
mediastinal adenopathy. Esophagus is within normal limits.

Lungs/Pleura: No pneumothorax. No pleural effusion. Dependent
atelectasis in the lungs.

Musculoskeletal: No definite acute vertebral compression deformity.
Scoliosis at the thoracolumbar junction is not significantly
changed. This is associated with advanced degenerative disc disease.

Review of the MIP images confirms the above findings.

CTA ABDOMEN AND PELVIS FINDINGS

VASCULAR

Aorta: No evidence of aortic dissection or aneurysm. Scattered
atherosclerotic calcifications are noted.

Celiac: Patent. Branch vessels patent. Accessory left hepatic artery
anatomy.

SMA: Atherosclerotic calcifications are present at the origin. No
significant focal narrowing. Replaced right hepatic artery.

Renals: Single renal arteries are patent.

IMA: Origin is patent. There is significant narrowing 3 cm beyond
its takeoff. Branch vessels are grossly patent.

Inflow: There are atherosclerotic calcifications throughout the
iliac arterial system but no significant narrowing in the common,
internal, or external iliac arteries. Left common iliac artery is
ectatic with a maximal caliber of 11 mm.

Review of the MIP images confirms the above findings.

NON-VASCULAR

Hepatobiliary: Unremarkable

Pancreas: Unremarkable

Spleen: Unremarkable

Adrenals/Urinary Tract: Adrenal glands are grossly within normal
limits but there are poorly visualized. There is a simple cyst in
the lower pole of the left kidney. Other hypodensities are too small
to characterize. There is scarring in the left kidney. Right kidney
is within normal limits. Bladder is decompressed..

Stomach/Bowel: Sigmoid diverticulosis is present. There is no
convincing evidence of acute diverticulitis. There is no obvious
mass in the colon. No evidence of small-bowel obstruction. There is
fluid and high-density material layering in the stomach. It is
grossly within normal limits.

Lymphatic: No abnormal retroperitoneal adenopathy.

Reproductive: Uterus and adnexa are within normal limits.

Other: No free fluid.

Musculoskeletal: Thoracolumbar scoliosis. No vertebral compression
deformity.

Review of the MIP images confirms the above findings.
IMPRESSION: Vascular:

No evidence of aortic dissection.  No acute vascular pathology.

There is narrowing in the IMA, 3 cm beyond its takeoff. Celiac and
SMA are patent.

Nonvascular:

No acute process in the chest, abdomen, or pelvis.

Aortic Atherosclerosis ([id number]).

## 2018-08-24 IMAGING — DX DG CHEST 1V PORT
1 series · 1 of 1 positions shown · non-contrast
Comparison: Radiograph February 26, 2018.

CLINICAL DATA: Status post coronary bypass graft.

EXAM:
PORTABLE CHEST 1 VIEW

[chest]
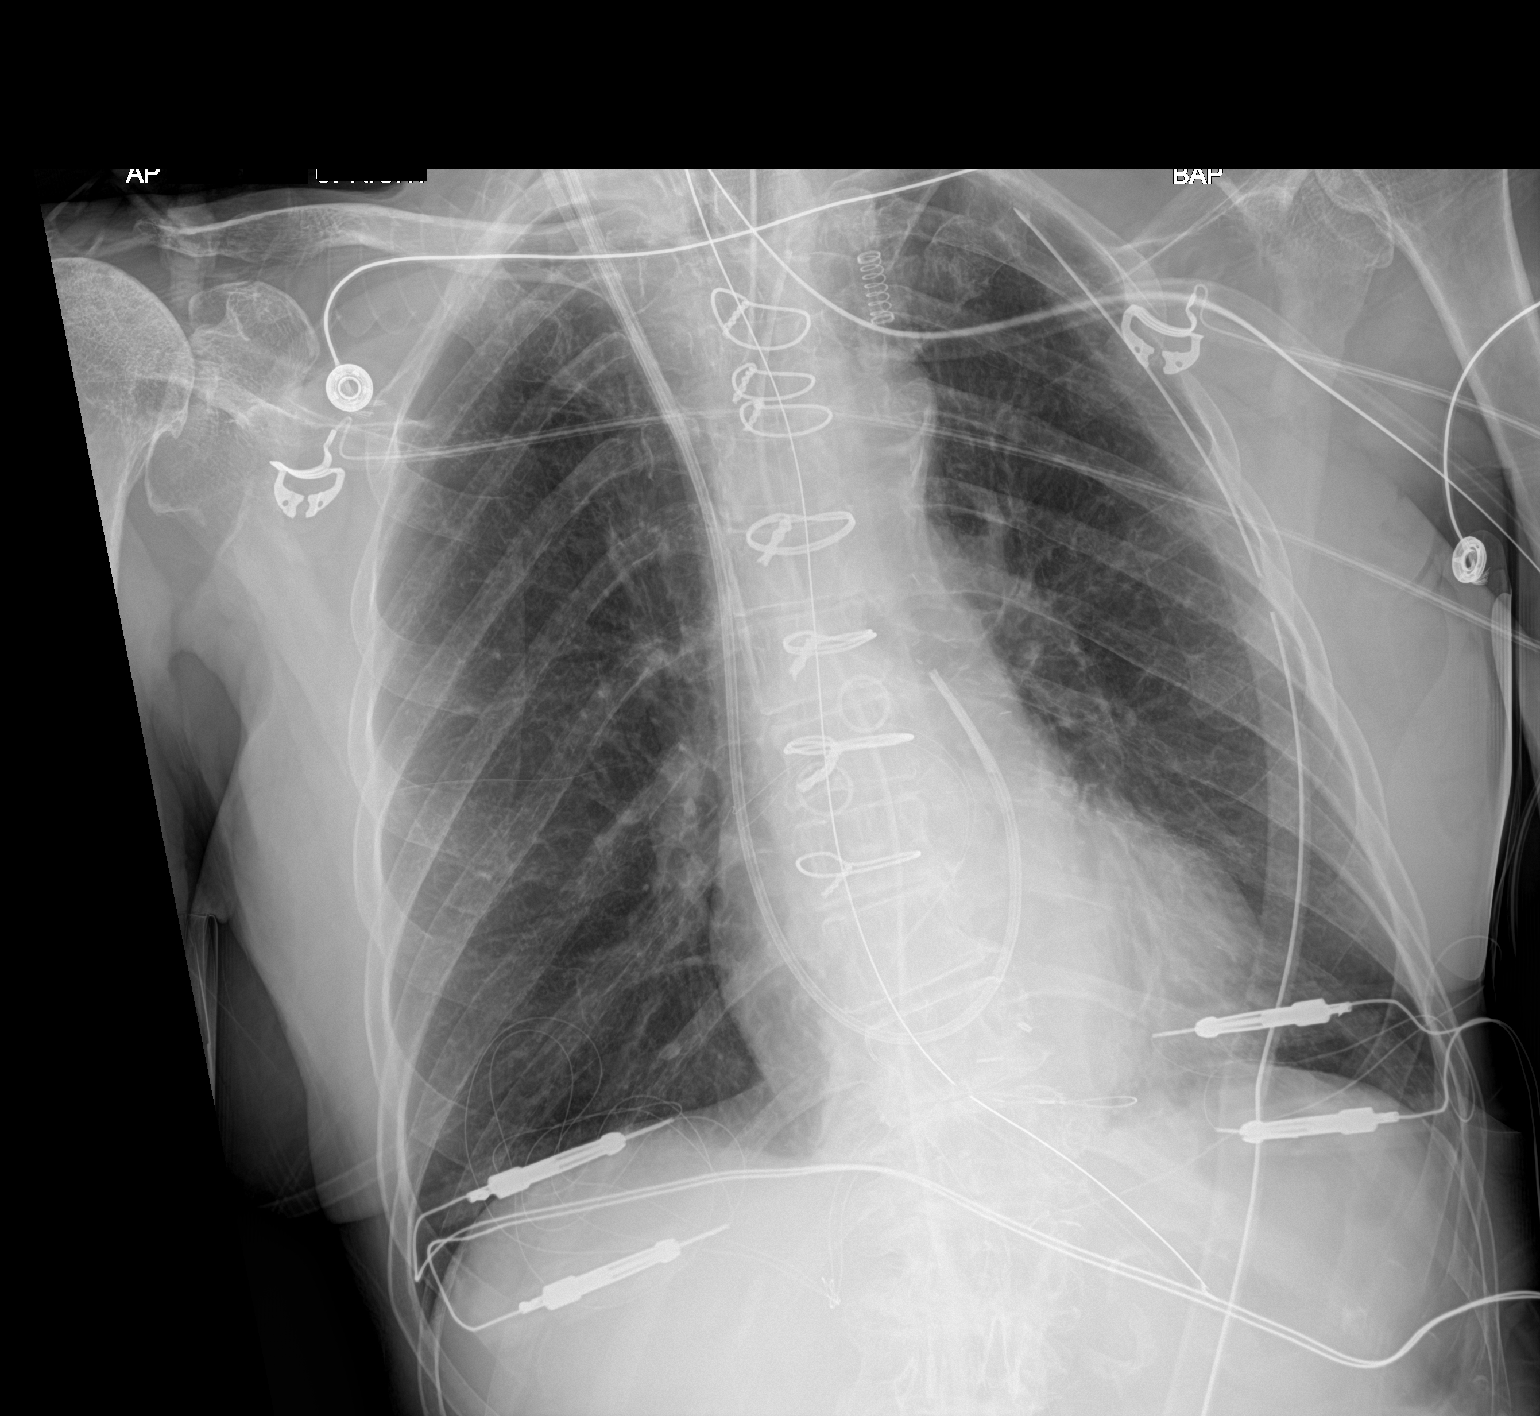

[1 of 1 positions shown; findings below may reference images not displayed]

FINDINGS: The heart size and mediastinal contours are within normal limits.
Endotracheal tube is in grossly good position. Distal tip of
nasogastric tube is seen in proximal stomach. Right internal jugular
Swan-Ganz catheter is noted with tip in expected position of main
pulmonary artery. Left-sided chest tube is noted without
pneumothorax. Right lung is clear. No pleural effusion is noted. The
visualized skeletal structures are unremarkable.
IMPRESSION: Endotracheal tube in grossly good position. Distal tip of
nasogastric tube seen in proximal stomach. Left-sided chest tube is
noted without pneumothorax. No acute pulmonary abnormality is noted.

## 2018-08-26 IMAGING — DX DG CHEST 1V PORT
1 series · 1 of 1 positions shown · non-contrast
Comparison: 03/03/2018

CLINICAL DATA: History of recent bypass grafting

EXAM:
PORTABLE CHEST 1 VIEW

[chest ap]
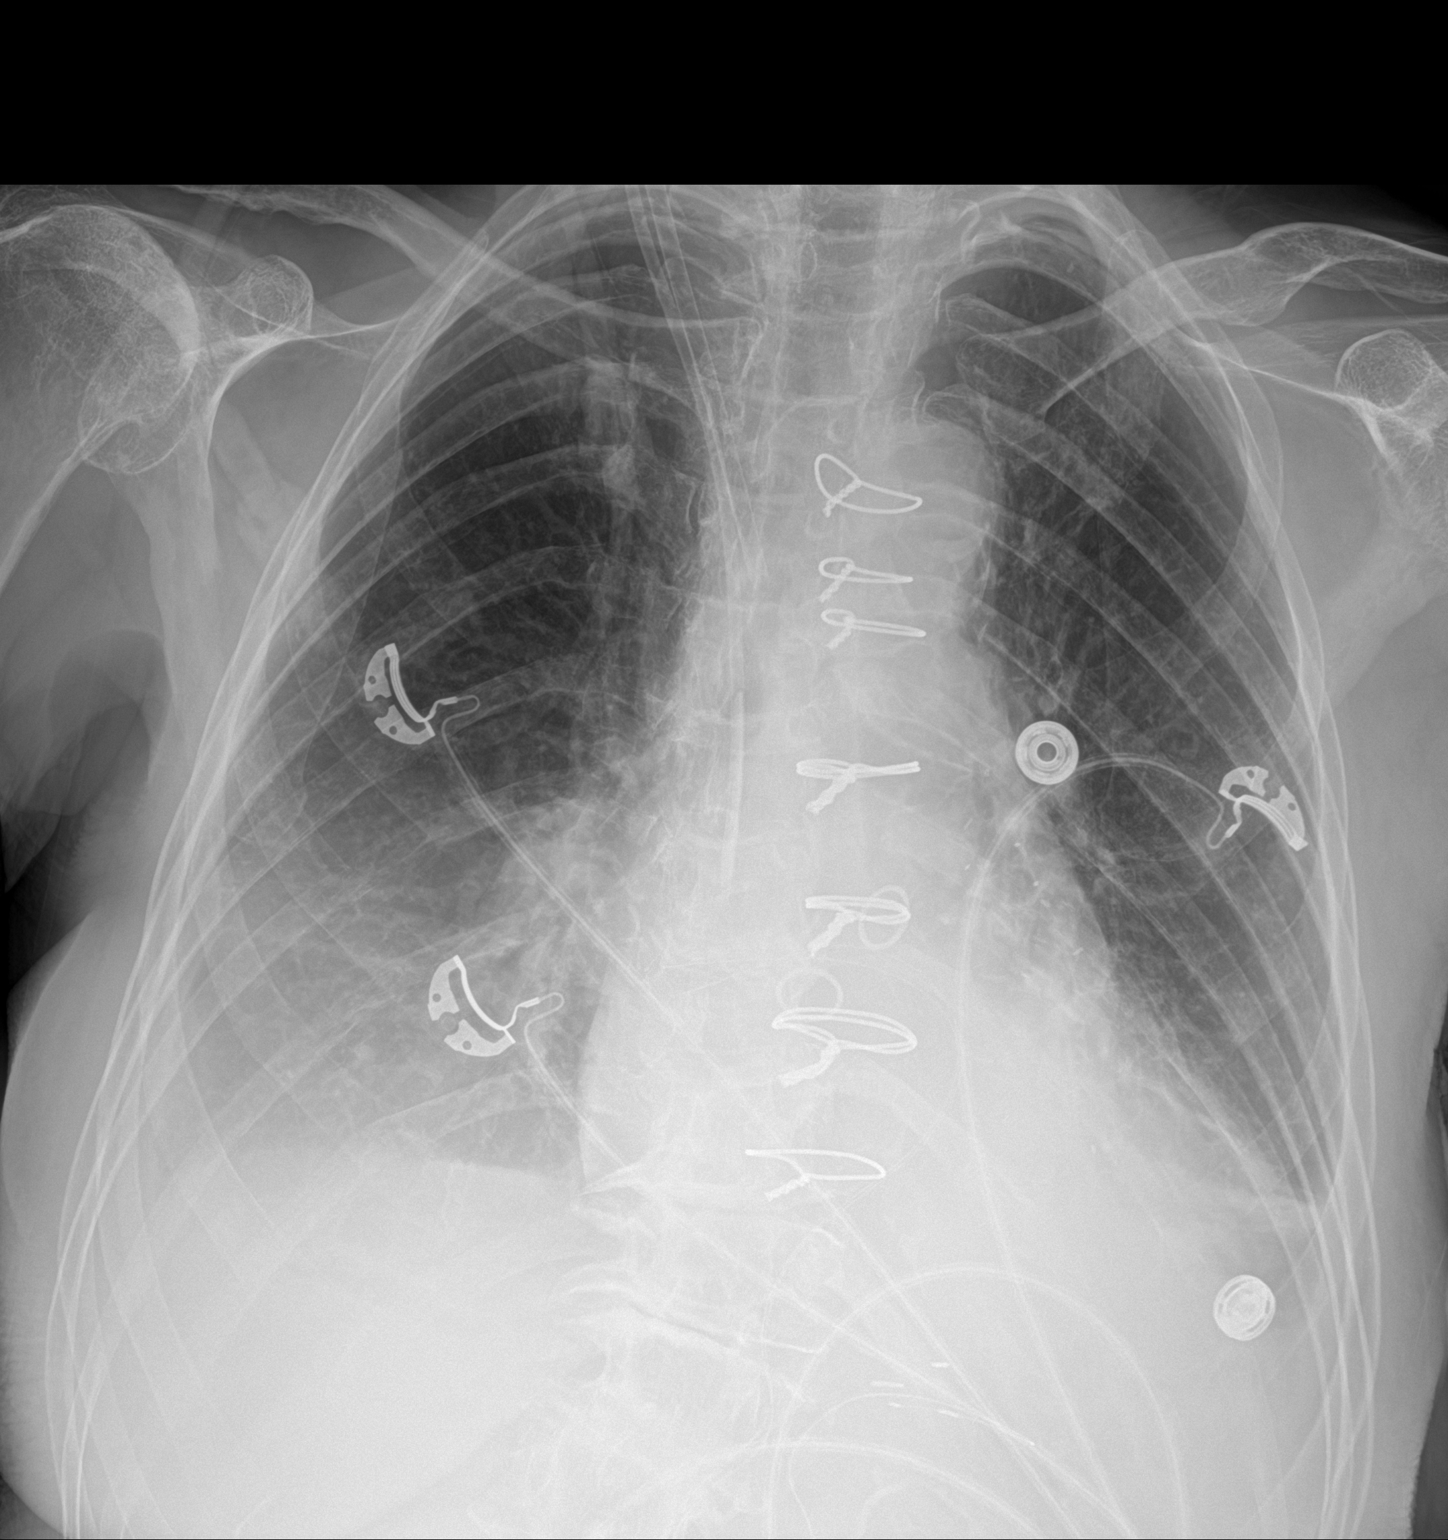

[1 of 1 positions shown; findings below may reference images not displayed]

FINDINGS: Cardiac shadow is stable. Postsurgical changes are again seen.
Swan-Ganz catheter has been removed although the jugular sheath
remains on the right. Right jugular central line is again seen.
Bilateral pleural effusions right greater than left are noted which
may be positional in nature. Minimal left basilar atelectasis is
noted. No pneumothorax is noted.
IMPRESSION: Mild left basilar atelectasis.

Small bilateral pleural effusions which may be positional in nature
and likely stable from the prior exam.

## 2018-10-04 DIAGNOSIS — M9903 Segmental and somatic dysfunction of lumbar region: Secondary | ICD-10-CM | POA: Diagnosis not present

## 2018-10-04 DIAGNOSIS — M9901 Segmental and somatic dysfunction of cervical region: Secondary | ICD-10-CM | POA: Diagnosis not present

## 2018-10-04 DIAGNOSIS — M9905 Segmental and somatic dysfunction of pelvic region: Secondary | ICD-10-CM | POA: Diagnosis not present

## 2018-10-04 DIAGNOSIS — M9902 Segmental and somatic dysfunction of thoracic region: Secondary | ICD-10-CM | POA: Diagnosis not present

## 2018-10-08 DIAGNOSIS — M9903 Segmental and somatic dysfunction of lumbar region: Secondary | ICD-10-CM | POA: Diagnosis not present

## 2018-10-08 DIAGNOSIS — M9905 Segmental and somatic dysfunction of pelvic region: Secondary | ICD-10-CM | POA: Diagnosis not present

## 2018-10-08 DIAGNOSIS — M9901 Segmental and somatic dysfunction of cervical region: Secondary | ICD-10-CM | POA: Diagnosis not present

## 2018-10-08 DIAGNOSIS — M9902 Segmental and somatic dysfunction of thoracic region: Secondary | ICD-10-CM | POA: Diagnosis not present

## 2018-10-11 IMAGING — DX DG CHEST 2V
2 series · 2 of 2 positions shown · non-contrast
Comparison: Portable chest x-ray March 04, 2018

CLINICAL DATA: Status post CABG on March 02, 2018. No current
complaints. Former smoker.

EXAM:
CHEST - 2 VIEW

[dg chest 2 view (1 of 2)]
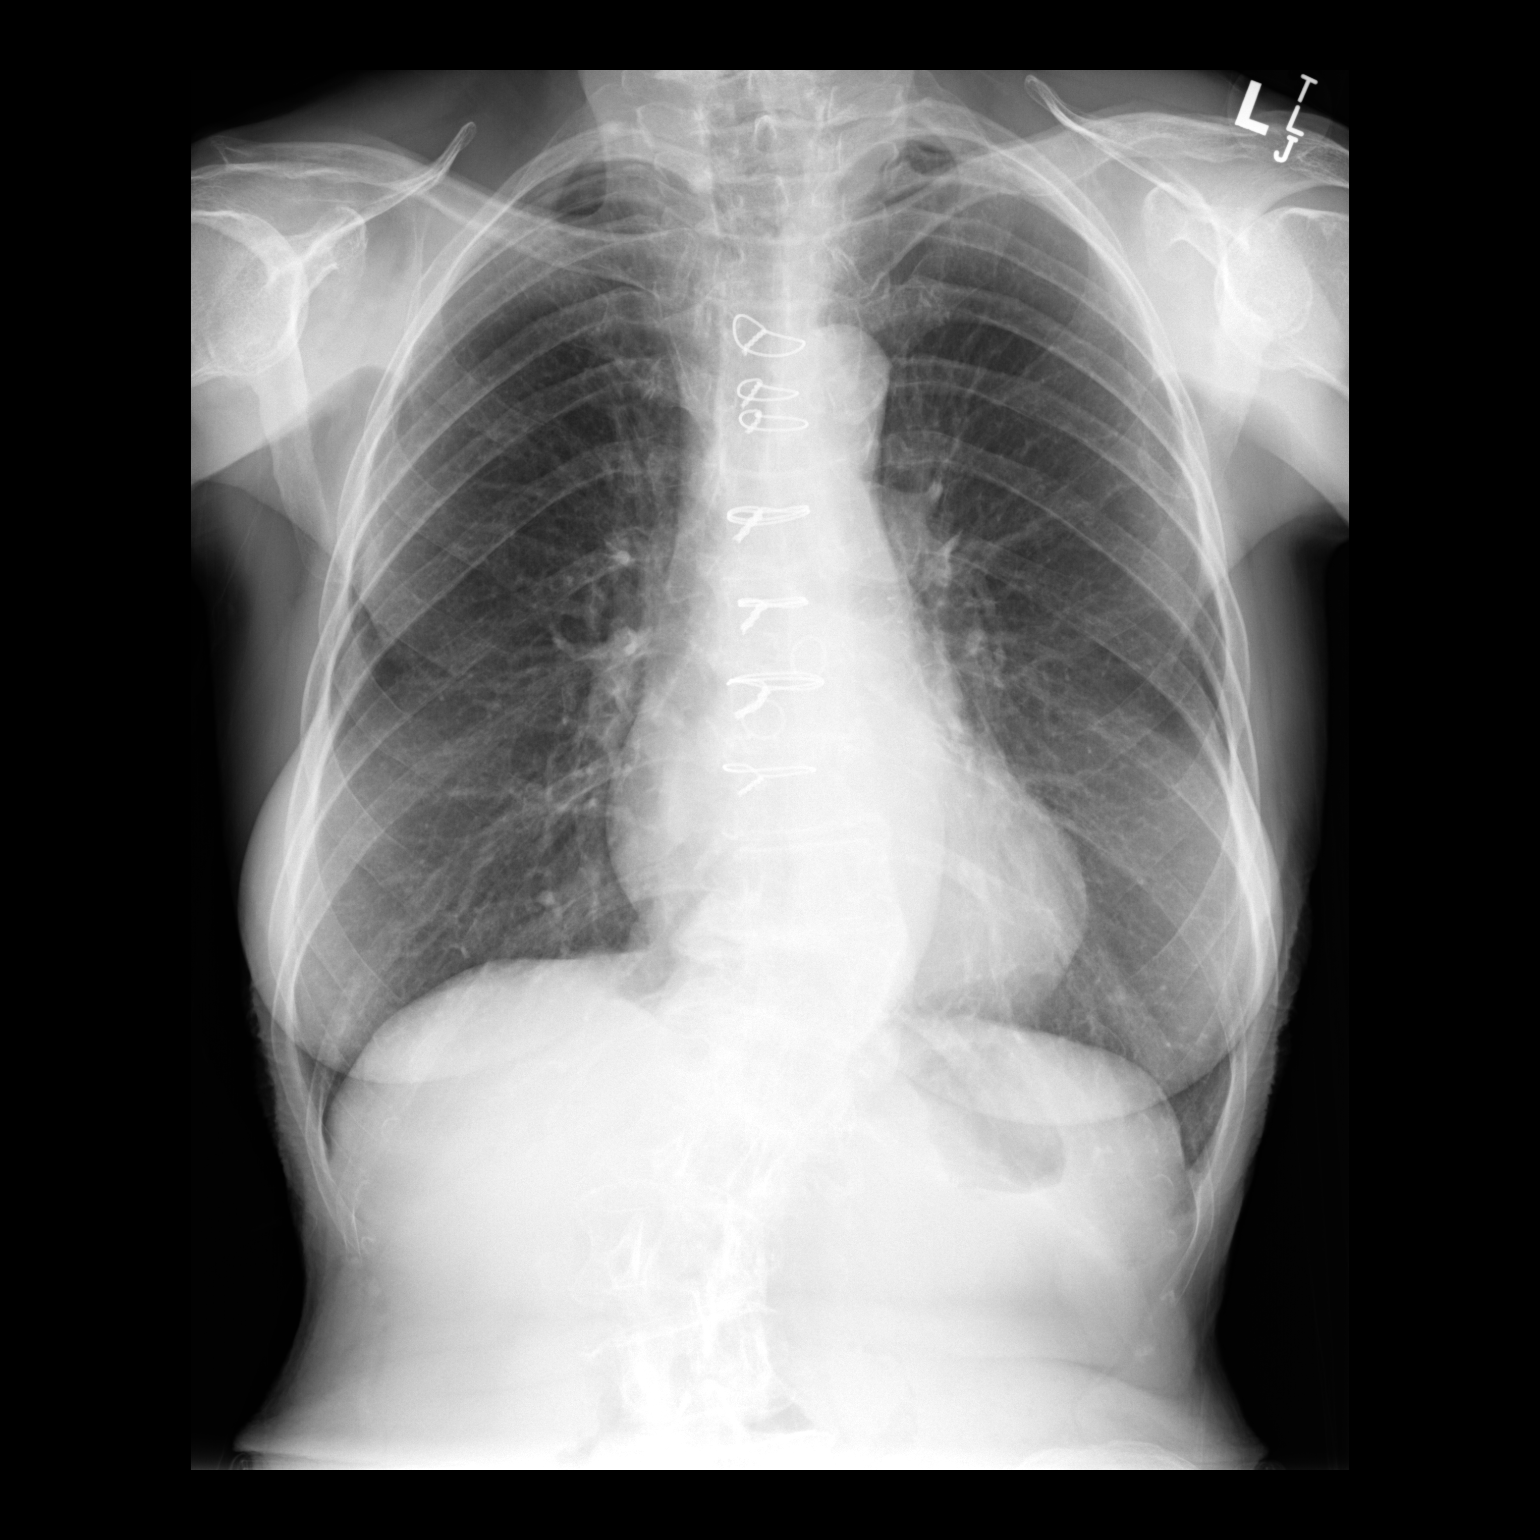

[dg chest 2 view (2 of 2)]
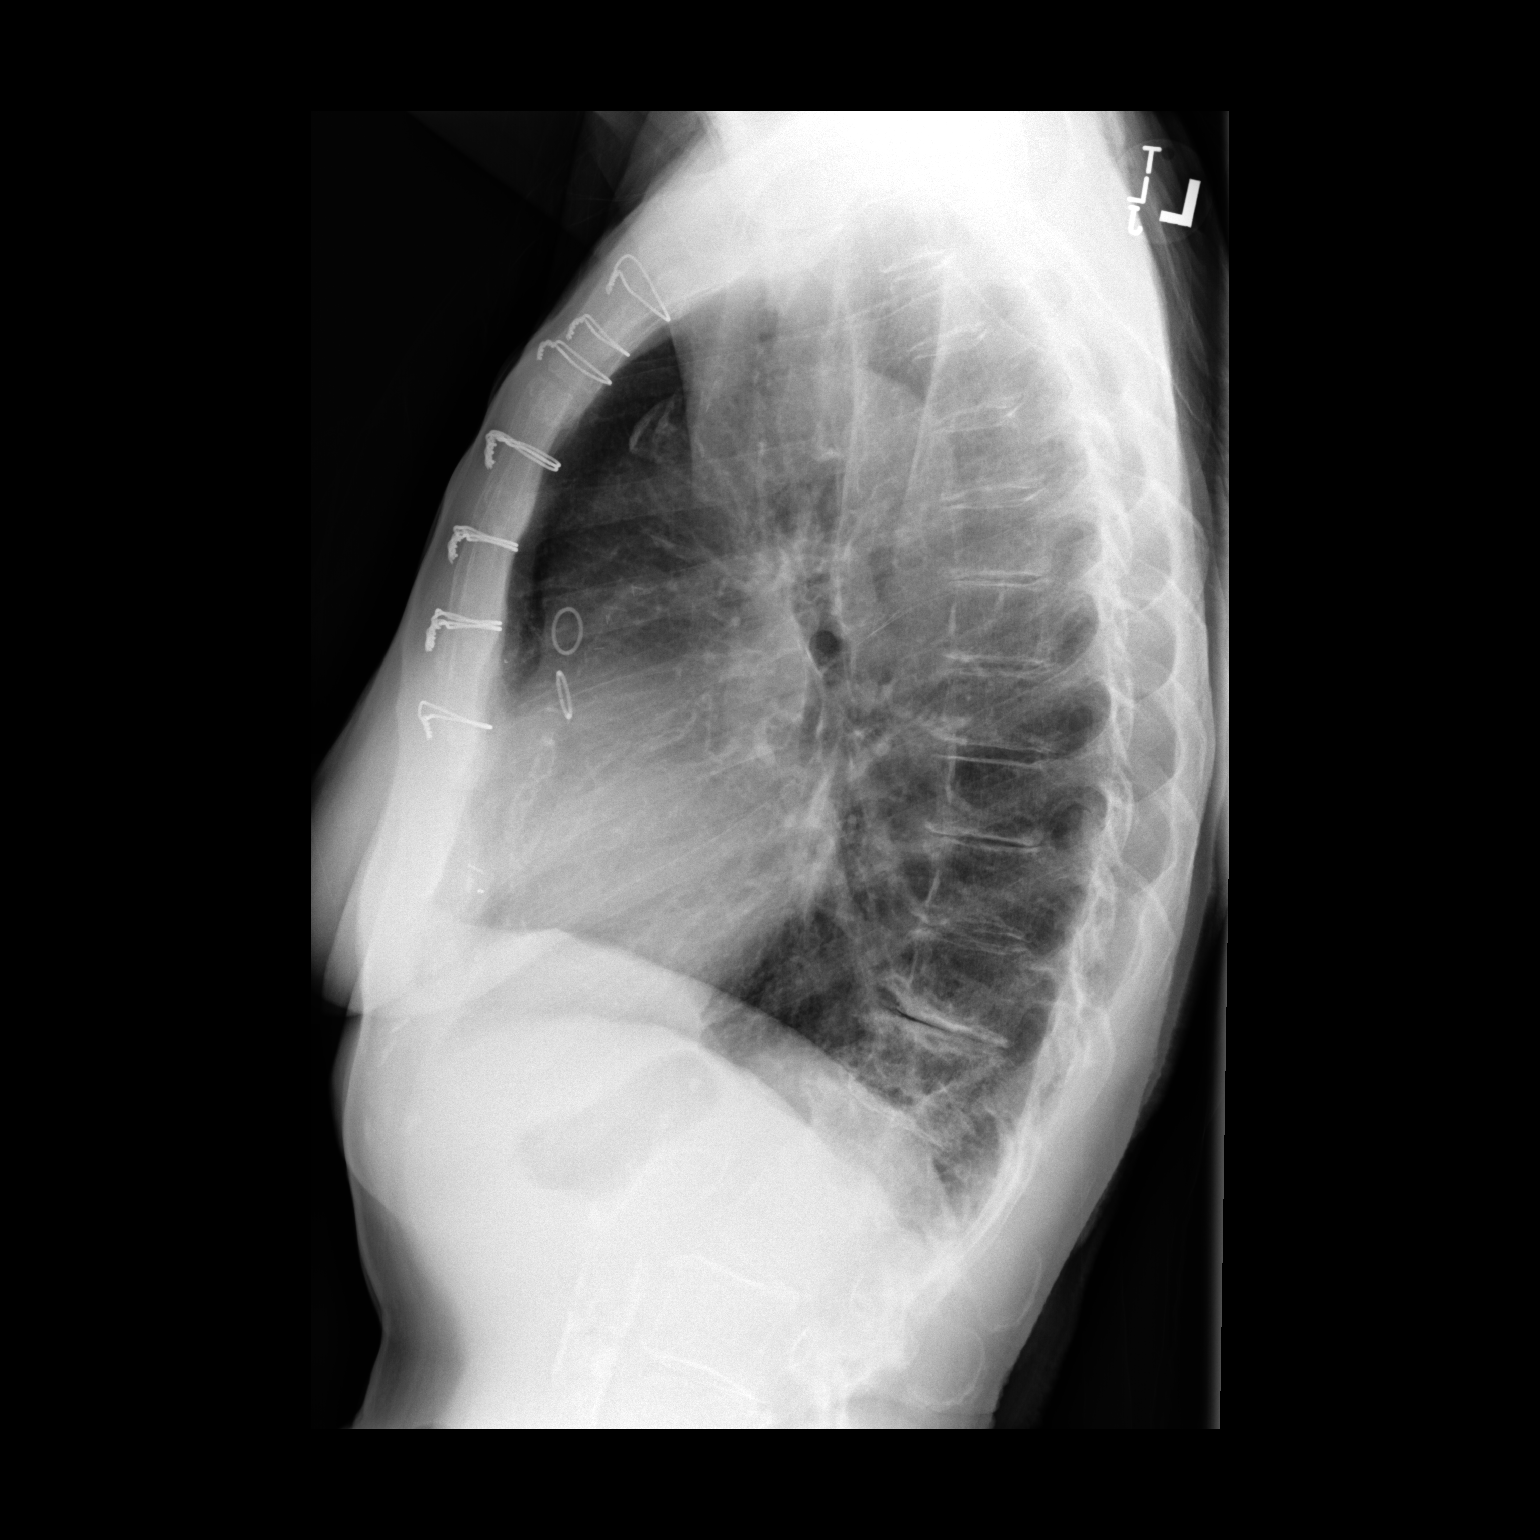

[2 of 2 positions shown; findings below may reference images not displayed]

FINDINGS: The lungs are mildly hyperinflated. There is no focal infiltrate. A
few coarse lung markings persist in the left lower lobe posteriorly.
There is no pleural effusion or pneumothorax. The heart and
pulmonary vascularity are normal. There is calcification in the wall
of the aortic arch. The sternal wires are intact. There is stable
scoliosis of the thoracolumbar spine.
IMPRESSION: No CHF, pneumonia, nor postprocedure complication. Minimal
atelectasis or scarring in the left lower lobe.

Thoracic aortic atherosclerosis.

## 2018-10-13 DIAGNOSIS — M9903 Segmental and somatic dysfunction of lumbar region: Secondary | ICD-10-CM | POA: Diagnosis not present

## 2018-10-13 DIAGNOSIS — M9902 Segmental and somatic dysfunction of thoracic region: Secondary | ICD-10-CM | POA: Diagnosis not present

## 2018-10-13 DIAGNOSIS — M9901 Segmental and somatic dysfunction of cervical region: Secondary | ICD-10-CM | POA: Diagnosis not present

## 2018-10-13 DIAGNOSIS — M9905 Segmental and somatic dysfunction of pelvic region: Secondary | ICD-10-CM | POA: Diagnosis not present

## 2018-10-25 DIAGNOSIS — E7849 Other hyperlipidemia: Secondary | ICD-10-CM | POA: Diagnosis not present

## 2018-10-25 DIAGNOSIS — E0590 Thyrotoxicosis, unspecified without thyrotoxic crisis or storm: Secondary | ICD-10-CM | POA: Diagnosis not present

## 2018-10-27 DIAGNOSIS — M9905 Segmental and somatic dysfunction of pelvic region: Secondary | ICD-10-CM | POA: Diagnosis not present

## 2018-10-27 DIAGNOSIS — M9901 Segmental and somatic dysfunction of cervical region: Secondary | ICD-10-CM | POA: Diagnosis not present

## 2018-10-27 DIAGNOSIS — M9902 Segmental and somatic dysfunction of thoracic region: Secondary | ICD-10-CM | POA: Diagnosis not present

## 2018-10-27 DIAGNOSIS — M9903 Segmental and somatic dysfunction of lumbar region: Secondary | ICD-10-CM | POA: Diagnosis not present

## 2018-11-09 DIAGNOSIS — M9903 Segmental and somatic dysfunction of lumbar region: Secondary | ICD-10-CM | POA: Diagnosis not present

## 2018-11-09 DIAGNOSIS — M9902 Segmental and somatic dysfunction of thoracic region: Secondary | ICD-10-CM | POA: Diagnosis not present

## 2018-11-09 DIAGNOSIS — M9901 Segmental and somatic dysfunction of cervical region: Secondary | ICD-10-CM | POA: Diagnosis not present

## 2018-11-09 DIAGNOSIS — M9905 Segmental and somatic dysfunction of pelvic region: Secondary | ICD-10-CM | POA: Diagnosis not present

## 2018-12-03 ENCOUNTER — Telehealth: Payer: Self-pay | Admitting: *Deleted

## 2018-12-03 NOTE — Telephone Encounter (Signed)
-----   Message from Tommie Raymond, NP sent at 12/03/2018 11:03 AM EDT ----- Regarding: virtual or tele visit Spoke with this patient. She is willing to do a tele or virtual visit. Has a smart phone. She can bee added to my schedule either the same day, 04/13 or if she wanted to do 04/07, 04/21 or 04/22. Thank you!

## 2018-12-03 NOTE — Telephone Encounter (Signed)
Spoke with pt and scheduled her for a video visit with Kathyrn Drown, NP on 4/13.  She needed to keep this date due schedule conflict next week.  Advised someone would be reaching out to her next week to get her set up on WebEx.

## 2018-12-10 NOTE — Progress Notes (Signed)
Virtual Visit via Video Note   This visit type was conducted due to national recommendations for restrictions regarding the COVID-19 Pandemic (e.g. social distancing) in an effort to limit this patient's exposure and mitigate transmission in our community.  Due to her co-morbid illnesses, this patient is at least at moderate risk for complications without adequate follow up.  This format is felt to be most appropriate for this patient at this time.  All issues noted in this document were discussed and addressed.  A limited physical exam was performed with this format.  Please refer to the patient's chart for her consent to telehealth for [facility].   Evaluation Performed:  Follow-up visit, seen for Dr. Tamala [name]  Date:  12/13/2018   ID:  Deborah [name], DOB 27-Jul-1946, MRN [id number]  Patient Location: Home  Provider Location: Home  PCP:  Deborah Redwood, MD  Cardiologist:  Deborah Grooms, MD  Chief Complaint:  Follow up for CAD  History of Present Illness:    Deborah [name] is a 73 y.o. female who presents via audio/video conferencing for a telehealth visit today for follow up of CAD, seen for Dr. Tamala [name].    Deborah [name] has a prior history of non-ST elevation MI 01/2018, hyperlipidemia and Graves' disease who underwent CABG x3 03/2018 by Deborah [name] with LIMA to LAD, reverse SVG to diagonal and reverse SVG to PDA.  She was last seen by her primary cardiologist 06/16/2018 with no chest pain or other anginal symptoms.  At that time, she was participating in cardiac rehabilitation with no difficulty.  Her LDL was not at goal and was noted to be fairly intolerant to more potent statins with recommendation of adding Zetia for better lipid control.  This is been managed by Deborah [name] her PCP.  She had complaints of a cough thought to be related to her lisinopril which was then discontinued.  Overall, she was doing well with plans for six-month follow-up scheduled for this time.  Today she is seen  for virtual visit and follow-up and and reports that she is doing well.  She reports she has been walking outside on a regular basis.  She denies chest pain, palpitations, shortness of breath, diaphoresis, PND, orthopnea, LE swelling, dizziness or syncope.  She states she briefly tried Zetia given her elevated LDL and was intolerant and therefore has not been taking this.  Also, reports that she has been taking ASA 325 mg since her cardiac surgery 03/2018.  We discussed this as well as decreasing the dose to ASA 81 daily.  Will send note to primary cardiologist.  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  Otherwise she is doing well.   The patient does not have symptoms concerning for COVID-19 infection (fever, chills, cough, or new shortness of breath).   Past Medical History:  Diagnosis Date  . Arthritis    "qwhere" (02/26/2018)  . Cancer [facility])    "chest" (02/26/2018)  . Coronary artery disease   . Graves' disease   . High cholesterol   . Thyroid disease    Past Surgical History:  Procedure Laterality Date  . ANTERIOR CRUCIATE LIGAMENT REPAIR Left   . BASAL CELL CARCINOMA EXCISION     "chest" (02/26/2018)  . CARDIAC CATHETERIZATION    . CARPAL TUNNEL  RELEASE Right   . CATARACT EXTRACTION W/ INTRAOCULAR LENS  IMPLANT, BILATERAL Bilateral   . CORONARY ARTERY BYPASS GRAFT N/A 03/02/2018   Procedure: CORONARY ARTERY BYPASS GRAFTING (CABG);  Surgeon: Deborah Isaac, MD;  Location: [place];  Service: Open Heart Surgery;  Laterality: N/A;  Times 3 using left internal mammary artery to LAD and endoscopically harvested left saphenous vein to Diagonal and PDA.  Marland Kitchen DILATION AND CURETTAGE OF UTERUS    . LEFT HEART CATH AND CORONARY ANGIOGRAPHY N/A 02/26/2018   Procedure: LEFT HEART CATH AND CORONARY ANGIOGRAPHY;   Surgeon: Leonie Man, MD;  Location: [place] CV LAB;  Service: Cardiovascular;  Laterality: N/A;  . RIGHT OOPHORECTOMY Right 1970s  . SHOULDER ARTHROSCOPY W/ ROTATOR CUFF REPAIR Bilateral   . TEE WITHOUT CARDIOVERSION N/A 03/02/2018   Procedure: TRANSESOPHAGEAL ECHOCARDIOGRAM (TEE);  Surgeon: Deborah Isaac, MD;  Location: [place];  Service: Open Heart Surgery;  Laterality: N/A;  . TONSILLECTOMY    . TRIGGER FINGER RELEASE Bilateral    "8 of them"     Current Meds  Medication Sig  . aspirin EC 325 MG EC tablet Take 1 tablet (325 mg total) by mouth daily.  . Calcium Carb-Cholecalciferol (CALCIUM+D3 PO) Take 1 tablet by mouth 2 (two) times daily after a meal.  . Cholecalciferol (VITAMIN D-3) 1000 units CAPS Take 1,000 Units by mouth at bedtime.  . methimazole (TAPAZOLE) 5 MG tablet Take 5 mg by mouth [name] admin instructions. Take 5 mg by mouth at bedtime on Mon/Tues/Wed/Thurs/Fri/Sat  . metoprolol tartrate (LOPRESSOR) 25 MG tablet Take 1 tablet (25 mg total) by mouth 2 (two) times daily.  . Multiple Vitamins-Calcium (ONE-A-DAY WOMENS FORMULA) TABS Take 1 tablet by mouth daily.  . pravastatin (PRAVACHOL) 40 MG tablet Take 40 mg by mouth at bedtime.     Allergies:   Patient has no known allergies.   Social History   Tobacco Use  . Smoking status: Former Smoker    Packs/day: 0.50    Years: 30.00    Pack years: 15.00    Types: Cigarettes  . Smokeless tobacco: Never Used  . Tobacco comment: 02/26/2018 "nothing since before 2000"  Substance Use Topics  . Alcohol use: Yes    Alcohol/week: 6.0 standard drinks    Types: 6 Standard drinks or equivalent per week  . Drug use: Never     Family Hx: The patient's family history includes CAD in her father; Hypercholesterolemia in her father and sister; Multiple myeloma in her brother; Osteoporosis in her mother.  ROS:   Please [name] the history of present illness.     All other systems reviewed and are negative.  Prior CV studies:    The following studies were reviewed today:  Cath 02/26/2018  Culprit lesion: Mid RCA lesion is 95% stenosed. Heavily calcified irregular lesion  Prox RCA to Mid RCA lesion is 25% stenosed. Dist RCA lesion is 45% stenosed.  Prox LAD-1 lesion is 85% stenosed. - Prox LAD-2 lesion is 55% stenosed. - Prox LAD-3 lesion is 70% stenosed. (tandem lesions)  The left ventricular systolic function is normal. The left ventricular ejection fraction is greater than 65% by visual estimate.  LV end diastolic pressure is normal.  There is mild (2+) mitral regurgitation.  Heavily calcified coronary arteries with severe mid RCA 99% stenosis and diffuse 70 to 80% ostial-proximal LAD stenosis. Normal LV function and EDP.  Patient has severe disease. We will consult CT surgery.  She will be admitted to the  stepdown unit. Placed on IV heparin. If necessary IV nitroglycerin.  Continue risk factor modification - aspirin, statin, beta-blocker etc.    Echo 02/27/2018 LV EF: 60% - 65% Study Conclusions  - Left ventricle: The cavity size was normal. Wall thickness was normal. Systolic function was normal. The estimated ejection fraction was in the range of 60% to 65%. Wall motion was normal; there were no regional wall motion abnormalities. Doppler parameters are consistent with abnormal left ventricular relaxation (grade 1 diastolic dysfunction). - Aortic valve: There was trivial regurgitation. - Atrial septum: There was an atrial septal aneurysm.  Impressions:  - Normal LV systolic function; mild diastolic dysfunction; trace AI, MR and TR.   CABG x3 03/02/2018 PROCEDURE:Procedure(s) with comments: CORONARY ARTERY BYPASS GRAFTING (CABG) (N/A) - Times 3 using left internal mammary artery to LAD and endoscopically harvested left saphenous vein to Diagonal and PDA. TRANSESOPHAGEAL ECHOCARDIOGRAM (TEE) (N/A)   Labs/Other Tests and Data Reviewed:    EKG:  An ECG dated  03/03/2018 was personally reviewed today and demonstrated:  NSR  Recent Labs: 03/01/2018: ALT 17 03/03/2018: Magnesium 2.1 03/04/2018: BUN 11; Creatinine, Ser 0.59; Potassium 4.1; Sodium 132 03/26/2018: Hemoglobin 10.5; Platelets 367   Recent Lipid Panel Lab Results  Component Value Date/Time   CHOL 161 02/27/2018 05:18 AM   TRIG 45 02/27/2018 05:18 AM   HDL 64 02/27/2018 05:18 AM   CHOLHDL 2.5 02/27/2018 05:18 AM   LDLCALC 88 02/27/2018 05:18 AM    Wt Readings from Last 3 Encounters:  12/13/18 96 lb (43.5 kg)  07/28/18 98 lb 1.7 oz (44.5 kg)  06/16/18 97 lb (44 kg)     Objective:    Vital Signs:  BP 120/85   [name] (!) 55   Ht 5' 3.5" (1.613 m)   Wt 96 lb (43.5 kg)   BMI 16.74 kg/m    Well nourished, well developed female in no acute distress.  Color is good.  No shortness of breath on discussion.  Appears well  ASSESSMENT & PLAN:    1.  CAD with prior CABG 03/2018: -No anginal complaints -Will reduce ASA to 81 mg daily -Continue metoprolol 25 twice daily well as pravastatin 40 mg daily -Mildly bradycardic today, HR 55.  Patient asymptomatic.  Will continue metoprolol for now, consider reducing to 12.5  -Will need a lipid clinic referral at a later date after COVID per patient request given that the patient is a primary caregiver for her husband who is undergoing chemo and radiation for cancer  2.  Mixed hyperlipidemia: -Last LDL 02/27/2018, 120 with goal of <70 mg/dl -Continue pravastatin -Patient reports intolerance to Zetia and is now off of this medication -Patient agreeable for lipid clinic referral for possible Repatha or other agent, [name] above.   COVID-19 Education: The signs and symptoms of COVID-19 were discussed with the patient and how to seek care for testing (follow up with PCP or arrange E-visit).  The importance of social distancing was discussed today.  Time:   Today, I have spent 20 minutes with the patient with telehealth technology discussing the above  problems.     Medication Adjustments/Labs and Tests Ordered: Current medicines are reviewed at length with the patient today.  Concerns regarding medicines are outlined above.  Tests Ordered: No orders of the defined types were placed in this encounter.  Medication Changes: No orders of the defined types were placed in this encounter.   Disposition:  Follow up Dr. Tamala [name] in 6 months  Signed, Kathyrn Drown,  NP  12/13/2018 10:48 AM    [facility]

## 2018-12-13 ENCOUNTER — Other Ambulatory Visit: Payer: Self-pay

## 2018-12-13 ENCOUNTER — Telehealth (INDEPENDENT_AMBULATORY_CARE_PROVIDER_SITE_OTHER): Payer: Medicare Other | Admitting: Cardiology

## 2018-12-13 ENCOUNTER — Encounter: Payer: Self-pay | Admitting: Cardiology

## 2018-12-13 ENCOUNTER — Ambulatory Visit: Payer: Medicare Other | Admitting: Interventional Cardiology

## 2018-12-13 VITALS — BP 120/85 | HR 55 | Ht 63.5 in | Wt 96.0 lb

## 2018-12-13 DIAGNOSIS — E785 Hyperlipidemia, unspecified: Secondary | ICD-10-CM | POA: Diagnosis not present

## 2018-12-13 DIAGNOSIS — I25709 Atherosclerosis of coronary artery bypass graft(s), unspecified, with unspecified angina pectoris: Secondary | ICD-10-CM

## 2018-12-13 NOTE — Addendum Note (Signed)
Addended by: Mendel Ryder on: 12/13/2018 11:28 AM   Modules accepted: Orders

## 2018-12-13 NOTE — Patient Instructions (Signed)
Medication Instructions:  DECREASE: Aspirin to 81 mg daily   If you need a refill on your cardiac medications before your next appointment, please call your pharmacy.   Lab work: None  If you have labs (blood work) drawn today and your tests are completely normal, you will receive your results only by: Marland Kitchen MyChart Message (if you have MyChart) OR . A paper copy in the mail If you have any lab test that is abnormal or we need to change your treatment, we will call you to review the results.  Testing/Procedures: None  Follow-Up: At [facility], you and your health needs are our priority.  As part of our continuing mission to provide you with exceptional heart care, we have created designated Provider Care Teams.  These Care Teams include your primary Cardiologist (physician) and Advanced Practice Providers (APPs -  Physician Assistants and Nurse Practitioners) who all work together to provide you with the care you need, when you need it. You will need a follow up appointment in 6 months.  Please call our office 2 months in advance to schedule this appointment.  You may see [name], MD or one of the following Advanced Practice Providers on your designated Care Team:   Truitt Merle, NP Cecilie Kicks, NP . Kathyrn Drown, NP  Any Other Special Instructions Will Be Listed Below (If Applicable).

## 2019-04-25 DIAGNOSIS — Z23 Encounter for immunization: Secondary | ICD-10-CM | POA: Diagnosis not present

## 2019-04-25 DIAGNOSIS — E7849 Other hyperlipidemia: Secondary | ICD-10-CM | POA: Diagnosis not present

## 2019-04-25 DIAGNOSIS — M859 Disorder of bone density and structure, unspecified: Secondary | ICD-10-CM | POA: Diagnosis not present

## 2019-04-25 DIAGNOSIS — E0590 Thyrotoxicosis, unspecified without thyrotoxic crisis or storm: Secondary | ICD-10-CM | POA: Diagnosis not present

## 2019-04-25 DIAGNOSIS — M8589 Other specified disorders of bone density and structure, multiple sites: Secondary | ICD-10-CM | POA: Diagnosis not present

## 2019-04-28 DIAGNOSIS — Z1231 Encounter for screening mammogram for malignant neoplasm of breast: Secondary | ICD-10-CM | POA: Diagnosis not present

## 2019-04-29 DIAGNOSIS — M9902 Segmental and somatic dysfunction of thoracic region: Secondary | ICD-10-CM | POA: Diagnosis not present

## 2019-04-29 DIAGNOSIS — M9903 Segmental and somatic dysfunction of lumbar region: Secondary | ICD-10-CM | POA: Diagnosis not present

## 2019-04-29 DIAGNOSIS — M9901 Segmental and somatic dysfunction of cervical region: Secondary | ICD-10-CM | POA: Diagnosis not present

## 2019-04-29 DIAGNOSIS — M9905 Segmental and somatic dysfunction of pelvic region: Secondary | ICD-10-CM | POA: Diagnosis not present

## 2019-05-02 ENCOUNTER — Ambulatory Visit: Payer: Medicare Other | Admitting: Cardiology

## 2019-05-05 DIAGNOSIS — Z1389 Encounter for screening for other disorder: Secondary | ICD-10-CM | POA: Diagnosis not present

## 2019-05-05 DIAGNOSIS — F17200 Nicotine dependence, unspecified, uncomplicated: Secondary | ICD-10-CM | POA: Diagnosis not present

## 2019-05-05 DIAGNOSIS — E0590 Thyrotoxicosis, unspecified without thyrotoxic crisis or storm: Secondary | ICD-10-CM | POA: Diagnosis not present

## 2019-05-05 DIAGNOSIS — E785 Hyperlipidemia, unspecified: Secondary | ICD-10-CM | POA: Diagnosis not present

## 2019-05-05 DIAGNOSIS — M8580 Other specified disorders of bone density and structure, unspecified site: Secondary | ICD-10-CM | POA: Diagnosis not present

## 2019-05-05 DIAGNOSIS — I25810 Atherosclerosis of coronary artery bypass graft(s) without angina pectoris: Secondary | ICD-10-CM | POA: Diagnosis not present

## 2019-05-05 DIAGNOSIS — I8390 Asymptomatic varicose veins of unspecified lower extremity: Secondary | ICD-10-CM | POA: Diagnosis not present

## 2019-05-05 DIAGNOSIS — Z0000 Encounter for general adult medical examination without abnormal findings: Secondary | ICD-10-CM | POA: Diagnosis not present

## 2019-05-05 DIAGNOSIS — Z1331 Encounter for screening for depression: Secondary | ICD-10-CM | POA: Diagnosis not present

## 2019-05-05 DIAGNOSIS — L989 Disorder of the skin and subcutaneous tissue, unspecified: Secondary | ICD-10-CM | POA: Diagnosis not present

## 2019-06-01 DIAGNOSIS — M9901 Segmental and somatic dysfunction of cervical region: Secondary | ICD-10-CM | POA: Diagnosis not present

## 2019-06-01 DIAGNOSIS — M9903 Segmental and somatic dysfunction of lumbar region: Secondary | ICD-10-CM | POA: Diagnosis not present

## 2019-06-01 DIAGNOSIS — M9905 Segmental and somatic dysfunction of pelvic region: Secondary | ICD-10-CM | POA: Diagnosis not present

## 2019-06-01 DIAGNOSIS — M9902 Segmental and somatic dysfunction of thoracic region: Secondary | ICD-10-CM | POA: Diagnosis not present

## 2019-06-02 DIAGNOSIS — E7849 Other hyperlipidemia: Secondary | ICD-10-CM | POA: Diagnosis not present

## 2019-07-01 DIAGNOSIS — M9903 Segmental and somatic dysfunction of lumbar region: Secondary | ICD-10-CM | POA: Diagnosis not present

## 2019-07-01 DIAGNOSIS — M9901 Segmental and somatic dysfunction of cervical region: Secondary | ICD-10-CM | POA: Diagnosis not present

## 2019-07-01 DIAGNOSIS — M9905 Segmental and somatic dysfunction of pelvic region: Secondary | ICD-10-CM | POA: Diagnosis not present

## 2019-07-01 DIAGNOSIS — M9902 Segmental and somatic dysfunction of thoracic region: Secondary | ICD-10-CM | POA: Diagnosis not present

## 2019-07-11 ENCOUNTER — Ambulatory Visit: Payer: Medicare Other | Admitting: Interventional Cardiology

## 2019-07-12 NOTE — Progress Notes (Signed)
Cardiology Office Note:    Date:  07/13/2019   ID:  Deborah [name], DOB 1946/02/07, MRN [id number]  PCP:  Marton Redwood, MD  Cardiologist:  [name], MD   Referring MD: Marton Redwood, MD   Chief Complaint  Patient presents with  . Coronary Artery Disease    History of Present Illness:    Deborah [name] is a 73 y.o. female with a hx of non-ST elevation myocardial infarction June 2019, followed by bypass grafting times 3 (July 2019 with LIMA to LAD, saphenous vein graft to diagonal, and saphenous vein graft to PDA), hyperlipidemia, and Graves' disease who underwent coronary artery bypass grafting in 05/06/18.  She is doing well.  Her husband, Deborah [name], passed away in May 07, 2023.  She seems somewhat down.  She also has questions about her sternum.  She notices the metal sutures.  I explained to her this is because she is very thin.  She has not had angina, orthopnea, PND, lower extremity edema, wheezing, cough, or other problems.  Past Medical History:  Diagnosis Date  . Arthritis    "qwhere" (02/26/2018)  . Cancer [facility])    "chest" (02/26/2018)  . Coronary artery disease   . Graves' disease   . High cholesterol   . Thyroid disease     Past Surgical History:  Procedure Laterality Date  . ANTERIOR CRUCIATE LIGAMENT REPAIR Left   . BASAL CELL CARCINOMA EXCISION     "chest" (02/26/2018)  . CARDIAC CATHETERIZATION    . CARPAL TUNNEL RELEASE Right   . CATARACT EXTRACTION W/ INTRAOCULAR LENS  IMPLANT, BILATERAL Bilateral   . CORONARY ARTERY BYPASS GRAFT N/A 03/02/2018   Procedure: CORONARY ARTERY BYPASS GRAFTING (CABG);  Surgeon: Grace Isaac, MD;  Location: [place];  Service: Open Heart Surgery;  Laterality: N/A;  Times 3 using left internal mammary artery to LAD and endoscopically harvested left saphenous vein to Diagonal and PDA.  Marland Kitchen DILATION AND CURETTAGE OF UTERUS    . LEFT HEART CATH AND CORONARY ANGIOGRAPHY N/A 02/26/2018   Procedure: LEFT HEART CATH AND CORONARY  ANGIOGRAPHY;  Surgeon: Leonie Man, MD;  Location: [place] CV LAB;  Service: Cardiovascular;  Laterality: N/A;  . RIGHT OOPHORECTOMY Right 1970s  . SHOULDER ARTHROSCOPY W/ ROTATOR CUFF REPAIR Bilateral   . TEE WITHOUT CARDIOVERSION N/A 03/02/2018   Procedure: TRANSESOPHAGEAL ECHOCARDIOGRAM (TEE);  Surgeon: Grace Isaac, MD;  Location: [place];  Service: Open Heart Surgery;  Laterality: N/A;  . TONSILLECTOMY    . TRIGGER FINGER RELEASE Bilateral    "8 of them"    Current Medications: Current Meds  Medication Sig  . aspirin 81 MG tablet Take 81 mg by mouth daily.  . Calcium Carb-Cholecalciferol (CALCIUM+D3 PO) Take 1 tablet by mouth 2 (two) times daily after a meal.  . Cholecalciferol (VITAMIN D-3) 1000 units CAPS Take 1,000 Units by mouth at bedtime.  . methimazole (TAPAZOLE) 5 MG tablet Take 5 mg by mouth See admin instructions. Take 5 mg by mouth at bedtime on Mon/Tues/Wed/Thurs/Fri/Sat  . metoprolol tartrate (LOPRESSOR) 25 MG tablet Take 1 tablet (25 mg total) by mouth 2 (two) times daily.  . Multiple Vitamins-Calcium (ONE-A-DAY WOMENS FORMULA) TABS Take 1 tablet by mouth daily.  . pravastatin (PRAVACHOL) 40 MG tablet Take 40 mg by mouth at bedtime.     Allergies:   Patient has no known allergies.   Social History   Socioeconomic History  . Marital status: Married    Spouse name:  John  . Number of children: 0  . Years of education: Not on file  . Highest education level: Not on file  Occupational History  . Not on file  Social Needs  . Financial resource strain: Not hard at all  . Food insecurity    Worry: Never true    Inability: Never true  . Transportation needs    Medical: No    Non-medical: No  Tobacco Use  . Smoking status: Former Smoker    Packs/day: 0.50    Years: 30.00    Pack years: 15.00    Types: Cigarettes  . Smokeless tobacco: Never Used  . Tobacco comment: 02/26/2018 "nothing since before 2000"  Substance and Sexual Activity  . Alcohol  use: Yes    Alcohol/week: 6.0 standard drinks    Types: 6 Standard drinks or equivalent per week  . Drug use: Never  . Sexual activity: Not Currently  Lifestyle  . Physical activity    Days per week: 7 days    Minutes per session: 30 min  . Stress: To some extent  Relationships  . Social Herbalist on phone: Not on file    Gets together: Not on file    Attends religious service: Not on file    Active member of club or organization: Not on file    Attends meetings of clubs or organizations: Not on file    Relationship status: Not on file  Other Topics Concern  . Not on file  Social History Narrative  . Not on file     Family History: The patient's family history includes CAD in her father; Hypercholesterolemia in her father and sister; Multiple myeloma in her brother; Osteoporosis in her mother.  ROS:   Please see the history of present illness.    No new data.  All other systems reviewed and are negative.  EKGs/Labs/Other Studies Reviewed:    The following studies were reviewed today: No new data  EKG:  EKG normal sinus rhythm, vertical axis, small inferior Q waves, poor R wave progression V1 through V4.  PAC.  Normal in appearance.  Recent Labs: No results found for requested labs within last 8760 hours.  Recent Lipid Panel    Component Value Date/Time   CHOL 161 02/27/2018 0518   TRIG 45 02/27/2018 0518   HDL 64 02/27/2018 0518   CHOLHDL 2.5 02/27/2018 0518   VLDL 9 02/27/2018 0518   LDLCALC 88 02/27/2018 0518    Physical Exam:    VS:  BP (!) 102/52   Pulse 63   Ht 5' 3.5" (1.613 m)   Wt 99 lb 12.8 oz (45.3 kg)   SpO2 97%   BMI 17.40 kg/m     Wt Readings from Last 3 Encounters:  07/13/19 99 lb 12.8 oz (45.3 kg)  12/13/18 96 lb (43.5 kg)  07/28/18 98 lb 1.7 oz (44.5 kg)     GEN: Slender.  Healthy appearing younger than her stated age.. No acute distress HEENT: Normal NECK: No JVD. LYMPHATICS: No lymphadenopathy CARDIAC:  RRR without  murmur, gallop, or edema.  Each sternal suture is visible.  There is no skin irritation or erythema. VASCULAR:  Normal Pulses. No bruits. RESPIRATORY:  Clear to auscultation without rales, wheezing or rhonchi  ABDOMEN: Soft, non-tender, non-distended, No pulsatile mass, MUSCULOSKELETAL: No deformity  SKIN: Warm and dry NEUROLOGIC:  Alert and oriented x 3 PSYCHIATRIC:  Normal affect   ASSESSMENT:    1. Coronary artery disease involving  coronary bypass graft of native heart with angina pectoris ([place])   2. Mixed hyperlipidemia   3. ACE-inhibitor cough   4. Educated about COVID-19 virus infection    PLAN:    In order of problems listed above:  1. Secondary prevention discussed in detail 2. LDL target should be less than 55.  She will add PCSK9 therapy in January.  This is being managed by Dr. Brigitte Pulse. 3. Unable to use ACE therapy as a preventive measure due to cough.  If needed, we could try ARB but her blood pressure will not allow. 4. The 3W's is endorsed, understood, and being practiced to avoid COVID-19.   Medication Adjustments/Labs and Tests Ordered: Current medicines are reviewed at length with the patient today.  Concerns regarding medicines are outlined above.  Orders Placed This Encounter  Procedures  . EKG 12-Lead   No orders of the defined types were placed in this encounter.   Patient Instructions  Medication Instructions:  Your physician recommends that you continue on your current medications as directed. Please refer to the Current Medication list given to you today.  *If you need a refill on your cardiac medications before your next appointment, please call your pharmacy*  Lab Work: None If you have labs (blood work) drawn today and your tests are completely normal, you will receive your results only by: Marland Kitchen MyChart Message (if you have MyChart) OR . A paper copy in the mail If you have any lab test that is abnormal or we need to change your treatment, we will  call you to review the results.  Testing/Procedures: None  Follow-Up: At [facility], you and your health needs are our priority.  As part of our continuing mission to provide you with exceptional heart care, we have created designated Provider Care Teams.  These Care Teams include your primary Cardiologist (physician) and Advanced Practice Providers (APPs -  Physician Assistants and Nurse Practitioners) who all work together to provide you with the care you need, when you need it.  Your next appointment:   12 months  The format for your next appointment:   In Person  Provider:   You may see [name], MD or one of the following Advanced Practice Providers on your designated Care Team:    Truitt Merle, NP  Cecilie Kicks, NP  Kathyrn Drown, NP   Other Instructions      Signed, [name], MD  07/13/2019 11:16 AM    [place]

## 2019-07-13 ENCOUNTER — Other Ambulatory Visit: Payer: Self-pay

## 2019-07-13 ENCOUNTER — Ambulatory Visit (INDEPENDENT_AMBULATORY_CARE_PROVIDER_SITE_OTHER): Payer: Medicare Other | Admitting: Interventional Cardiology

## 2019-07-13 ENCOUNTER — Encounter: Payer: Self-pay | Admitting: Interventional Cardiology

## 2019-07-13 VITALS — BP 102/52 | HR 63 | Ht 63.5 in | Wt 99.8 lb

## 2019-07-13 DIAGNOSIS — E782 Mixed hyperlipidemia: Secondary | ICD-10-CM | POA: Diagnosis not present

## 2019-07-13 DIAGNOSIS — I25709 Atherosclerosis of coronary artery bypass graft(s), unspecified, with unspecified angina pectoris: Secondary | ICD-10-CM | POA: Diagnosis not present

## 2019-07-13 DIAGNOSIS — R05 Cough: Secondary | ICD-10-CM | POA: Diagnosis not present

## 2019-07-13 DIAGNOSIS — Z7189 Other specified counseling: Secondary | ICD-10-CM

## 2019-07-13 DIAGNOSIS — T464X5A Adverse effect of angiotensin-converting-enzyme inhibitors, initial encounter: Secondary | ICD-10-CM

## 2019-07-13 DIAGNOSIS — R058 Other specified cough: Secondary | ICD-10-CM

## 2019-07-13 NOTE — Patient Instructions (Signed)

## 2019-07-22 ENCOUNTER — Other Ambulatory Visit: Payer: Self-pay | Admitting: Interventional Cardiology

## 2019-08-03 DIAGNOSIS — M9905 Segmental and somatic dysfunction of pelvic region: Secondary | ICD-10-CM | POA: Diagnosis not present

## 2019-08-03 DIAGNOSIS — M9901 Segmental and somatic dysfunction of cervical region: Secondary | ICD-10-CM | POA: Diagnosis not present

## 2019-08-03 DIAGNOSIS — M9903 Segmental and somatic dysfunction of lumbar region: Secondary | ICD-10-CM | POA: Diagnosis not present

## 2019-08-03 DIAGNOSIS — M9902 Segmental and somatic dysfunction of thoracic region: Secondary | ICD-10-CM | POA: Diagnosis not present

## 2019-09-14 DIAGNOSIS — M9905 Segmental and somatic dysfunction of pelvic region: Secondary | ICD-10-CM | POA: Diagnosis not present

## 2019-09-14 DIAGNOSIS — M9902 Segmental and somatic dysfunction of thoracic region: Secondary | ICD-10-CM | POA: Diagnosis not present

## 2019-09-14 DIAGNOSIS — M9901 Segmental and somatic dysfunction of cervical region: Secondary | ICD-10-CM | POA: Diagnosis not present

## 2019-09-14 DIAGNOSIS — M9903 Segmental and somatic dysfunction of lumbar region: Secondary | ICD-10-CM | POA: Diagnosis not present

## 2019-09-15 DIAGNOSIS — Z23 Encounter for immunization: Secondary | ICD-10-CM | POA: Diagnosis not present

## 2019-10-11 DIAGNOSIS — Z23 Encounter for immunization: Secondary | ICD-10-CM | POA: Diagnosis not present

## 2019-10-13 DIAGNOSIS — Z961 Presence of intraocular lens: Secondary | ICD-10-CM | POA: Diagnosis not present

## 2019-10-13 DIAGNOSIS — Z9842 Cataract extraction status, left eye: Secondary | ICD-10-CM | POA: Diagnosis not present

## 2019-10-13 DIAGNOSIS — H26491 Other secondary cataract, right eye: Secondary | ICD-10-CM | POA: Diagnosis not present

## 2019-10-13 DIAGNOSIS — H524 Presbyopia: Secondary | ICD-10-CM | POA: Diagnosis not present

## 2019-10-13 DIAGNOSIS — H5203 Hypermetropia, bilateral: Secondary | ICD-10-CM | POA: Diagnosis not present

## 2019-10-13 DIAGNOSIS — H52223 Regular astigmatism, bilateral: Secondary | ICD-10-CM | POA: Diagnosis not present

## 2019-12-15 DIAGNOSIS — H18413 Arcus senilis, bilateral: Secondary | ICD-10-CM | POA: Diagnosis not present

## 2019-12-15 DIAGNOSIS — H26492 Other secondary cataract, left eye: Secondary | ICD-10-CM | POA: Diagnosis not present

## 2019-12-15 DIAGNOSIS — H26491 Other secondary cataract, right eye: Secondary | ICD-10-CM | POA: Diagnosis not present

## 2019-12-15 DIAGNOSIS — Z961 Presence of intraocular lens: Secondary | ICD-10-CM | POA: Diagnosis not present

## 2019-12-22 DIAGNOSIS — Z961 Presence of intraocular lens: Secondary | ICD-10-CM | POA: Diagnosis not present

## 2020-01-03 DIAGNOSIS — H26492 Other secondary cataract, left eye: Secondary | ICD-10-CM | POA: Diagnosis not present

## 2020-01-10 DIAGNOSIS — H52223 Regular astigmatism, bilateral: Secondary | ICD-10-CM | POA: Diagnosis not present

## 2020-01-10 DIAGNOSIS — H5203 Hypermetropia, bilateral: Secondary | ICD-10-CM | POA: Diagnosis not present

## 2020-01-10 DIAGNOSIS — H524 Presbyopia: Secondary | ICD-10-CM | POA: Diagnosis not present

## 2020-01-10 DIAGNOSIS — Z9849 Cataract extraction status, unspecified eye: Secondary | ICD-10-CM | POA: Diagnosis not present

## 2020-01-10 DIAGNOSIS — Z961 Presence of intraocular lens: Secondary | ICD-10-CM | POA: Diagnosis not present

## 2020-05-02 DIAGNOSIS — Z1231 Encounter for screening mammogram for malignant neoplasm of breast: Secondary | ICD-10-CM | POA: Diagnosis not present

## 2020-05-25 DIAGNOSIS — E0500 Thyrotoxicosis with diffuse goiter without thyrotoxic crisis or storm: Secondary | ICD-10-CM | POA: Diagnosis not present

## 2020-05-25 DIAGNOSIS — E785 Hyperlipidemia, unspecified: Secondary | ICD-10-CM | POA: Diagnosis not present

## 2020-05-25 DIAGNOSIS — M859 Disorder of bone density and structure, unspecified: Secondary | ICD-10-CM | POA: Diagnosis not present

## 2020-06-01 DIAGNOSIS — E0590 Thyrotoxicosis, unspecified without thyrotoxic crisis or storm: Secondary | ICD-10-CM | POA: Diagnosis not present

## 2020-06-01 DIAGNOSIS — I25810 Atherosclerosis of coronary artery bypass graft(s) without angina pectoris: Secondary | ICD-10-CM | POA: Diagnosis not present

## 2020-06-01 DIAGNOSIS — F17211 Nicotine dependence, cigarettes, in remission: Secondary | ICD-10-CM | POA: Diagnosis not present

## 2020-06-01 DIAGNOSIS — E785 Hyperlipidemia, unspecified: Secondary | ICD-10-CM | POA: Diagnosis not present

## 2020-06-01 DIAGNOSIS — Z1339 Encounter for screening examination for other mental health and behavioral disorders: Secondary | ICD-10-CM | POA: Diagnosis not present

## 2020-06-01 DIAGNOSIS — Z1331 Encounter for screening for depression: Secondary | ICD-10-CM | POA: Diagnosis not present

## 2020-06-01 DIAGNOSIS — M8580 Other specified disorders of bone density and structure, unspecified site: Secondary | ICD-10-CM | POA: Diagnosis not present

## 2020-06-01 DIAGNOSIS — Z0000 Encounter for general adult medical examination without abnormal findings: Secondary | ICD-10-CM | POA: Diagnosis not present

## 2020-06-01 DIAGNOSIS — M25552 Pain in left hip: Secondary | ICD-10-CM | POA: Diagnosis not present

## 2020-06-01 DIAGNOSIS — I8390 Asymptomatic varicose veins of unspecified lower extremity: Secondary | ICD-10-CM | POA: Diagnosis not present

## 2020-06-01 DIAGNOSIS — M25551 Pain in right hip: Secondary | ICD-10-CM | POA: Diagnosis not present

## 2020-06-01 DIAGNOSIS — R198 Other specified symptoms and signs involving the digestive system and abdomen: Secondary | ICD-10-CM | POA: Diagnosis not present

## 2020-06-01 DIAGNOSIS — R82998 Other abnormal findings in urine: Secondary | ICD-10-CM | POA: Diagnosis not present

## 2020-07-11 ENCOUNTER — Other Ambulatory Visit: Payer: Self-pay | Admitting: Interventional Cardiology

## 2020-07-23 ENCOUNTER — Ambulatory Visit (INDEPENDENT_AMBULATORY_CARE_PROVIDER_SITE_OTHER): Payer: Medicare Other | Admitting: Interventional Cardiology

## 2020-07-23 ENCOUNTER — Encounter: Payer: Self-pay | Admitting: Interventional Cardiology

## 2020-07-23 ENCOUNTER — Other Ambulatory Visit: Payer: Self-pay

## 2020-07-23 VITALS — BP 110/64 | HR 60 | Ht 63.0 in | Wt 101.2 lb

## 2020-07-23 DIAGNOSIS — E782 Mixed hyperlipidemia: Secondary | ICD-10-CM

## 2020-07-23 DIAGNOSIS — I25709 Atherosclerosis of coronary artery bypass graft(s), unspecified, with unspecified angina pectoris: Secondary | ICD-10-CM

## 2020-07-23 DIAGNOSIS — R058 Other specified cough: Secondary | ICD-10-CM | POA: Diagnosis not present

## 2020-07-23 DIAGNOSIS — Z7189 Other specified counseling: Secondary | ICD-10-CM | POA: Diagnosis not present

## 2020-07-23 DIAGNOSIS — T464X5A Adverse effect of angiotensin-converting-enzyme inhibitors, initial encounter: Secondary | ICD-10-CM | POA: Diagnosis not present

## 2020-07-23 NOTE — Progress Notes (Signed)
Cardiology Office Note:    Date:  07/23/2020   ID:  Deborah [name], DOB 07-25-1946, MRN [id number]  PCP:  Marton Redwood, MD  Cardiologist:  [name], MD   Referring MD: Marton Redwood, MD   Chief Complaint  Patient presents with  . Coronary Artery Disease  . Hypertension  . Hyperlipidemia    History of Present Illness:    Deborah [name] is a 74 y.o. female with a hx of a hx of non-ST elevation myocardial infarction June 2019, followed by bypass grafting times 3 (July 2019 with LIMA to LAD, saphenous vein graft to diagonal, and saphenous vein graft to PDA), hyperlipidemia, and Graves' disease who underwent coronary artery bypass grafting in August 2019.  She is doing well.  She denies angina.  She is having no palpitations, claudication, dyspnea, or edema.  Physical activity is routine.  She walks greater than 60 minutes 6 out of the 7 days of the week.  No associated or chest discomfort.  Past Medical History:  Diagnosis Date  . Arthritis    "qwhere" (02/26/2018)  . Cancer [facility])    "chest" (02/26/2018)  . Coronary artery disease   . Graves' disease   . High cholesterol   . Thyroid disease     Past Surgical History:  Procedure Laterality Date  . ANTERIOR CRUCIATE LIGAMENT REPAIR Left   . BASAL CELL CARCINOMA EXCISION     "chest" (02/26/2018)  . CARDIAC CATHETERIZATION    . CARPAL TUNNEL RELEASE Right   . CATARACT EXTRACTION W/ INTRAOCULAR LENS  IMPLANT, BILATERAL Bilateral   . CORONARY ARTERY BYPASS GRAFT N/A 03/02/2018   Procedure: CORONARY ARTERY BYPASS GRAFTING (CABG);  Surgeon: Grace Isaac, MD;  Location: [place];  Service: Open Heart Surgery;  Laterality: N/A;  Times 3 using left internal mammary artery to LAD and endoscopically harvested left saphenous vein to Diagonal and PDA.  Marland Kitchen DILATION AND CURETTAGE OF UTERUS    . LEFT HEART CATH AND CORONARY ANGIOGRAPHY N/A 02/26/2018   Procedure: LEFT HEART CATH AND CORONARY ANGIOGRAPHY;  Surgeon: Leonie Man,  MD;  Location: [place] CV [facility];  Service: Cardiovascular;  Laterality: N/A;  . RIGHT OOPHORECTOMY Right 1970s  . SHOULDER ARTHROSCOPY W/ ROTATOR CUFF REPAIR Bilateral   . TEE WITHOUT CARDIOVERSION N/A 03/02/2018   Procedure: TRANSESOPHAGEAL ECHOCARDIOGRAM (TEE);  Surgeon: Grace Isaac, MD;  Location: [place];  Service: Open Heart Surgery;  Laterality: N/A;  . TONSILLECTOMY    . TRIGGER FINGER RELEASE Bilateral    "8 of them"    Current Medications: Current Meds  Medication Sig  . aspirin 81 MG tablet Take 81 mg by mouth daily.  . Calcium Carb-Cholecalciferol (CALCIUM+D3 PO) Take 1 tablet by mouth 2 (two) times daily after a meal.  . Cholecalciferol (VITAMIN D-3) 1000 units CAPS Take 1,000 Units by mouth at bedtime.  . methimazole (TAPAZOLE) 5 MG tablet Take 5 mg by mouth See admin instructions. Take 5 mg by mouth at bedtime on Mon/Tues/Wed/Thurs/Fri/Sat  . metoprolol tartrate (LOPRESSOR) 25 MG tablet Take 1 tablet (25 mg total) by mouth 2 (two) times daily. Please keep upcoming appt in November with Dr. Tamala Julian before anymore refills. Thank you  . Multiple Vitamins-Calcium (ONE-A-DAY WOMENS FORMULA) TABS Take 1 tablet by mouth daily.  Marland Kitchen PRALUENT 75 MG/ML SOAJ   . pravastatin (PRAVACHOL) 40 MG tablet Take 40 mg by mouth at bedtime.     Allergies:   Patient has no known allergies.  Social History   Socioeconomic History  . Marital status: Married    Spouse name: [name]  . Number of children: 0  . Years of education: Not on file  . Highest education level: Not on file  Occupational History  . Not on file  Tobacco Use  . Smoking status: Former Smoker    Packs/day: 0.50    Years: 30.00    Pack years: 15.00    Types: Cigarettes  . Smokeless tobacco: Never Used  . Tobacco comment: 02/26/2018 "nothing since before 2000"  Vaping Use  . Vaping Use: Never used  Substance and Sexual Activity  . Alcohol use: Yes    Alcohol/week: 6.0 standard drinks    Types: 6 Standard drinks or  equivalent per week  . Drug use: Never  . Sexual activity: Not Currently  Other Topics Concern  . Not on file  Social History Narrative  . Not on file   Social Determinants of Health   Financial Resource Strain:   . Difficulty of Paying Living Expenses: Not on file  Food Insecurity:   . Worried About Charity fundraiser in the Last Year: Not on file  . Ran Out of Food in the Last Year: Not on file  Transportation Needs:   . Lack of Transportation (Medical): Not on file  . Lack of Transportation (Non-Medical): Not on file  Physical Activity:   . Days of Exercise per Week: Not on file  . Minutes of Exercise per Session: Not on file  Stress:   . Feeling of Stress : Not on file  Social Connections:   . Frequency of Communication with Friends and Family: Not on file  . Frequency of Social Gatherings with Friends and Family: Not on file  . Attends Religious Services: Not on file  . Active Member of Clubs or Organizations: Not on file  . Attends Archivist Meetings: Not on file  . Marital Status: Not on file     Family History: The patient's family history includes CAD in her father; Hypercholesterolemia in her father and sister; Multiple myeloma in her brother; Osteoporosis in her mother.  ROS:   Please see the history of present illness.    She has arthritis and her joints prevent even more physical activity all other systems reviewed and are negative.  EKGs/Labs/Other Studies Reviewed:    The following studies were reviewed today:  Transesophageal echocardiogram performed July 2019: Study Result   Mitral valve: The mitral leaflets were moderately thickened. The  anterior leaflet was billowing but there were no prolapsing or flail  leaflet segments noted. There was 1+ mitral insufficiency.   Right ventricle: Right ventricular cavity was normal in size. There was  normal right ventricular systolic function. The post bypass exam was  normal right ventricular  systolic function.   Tricuspid valve: Tricuspid valve is structurally normal. There was 1+  tricuspid insufficiency.   Pulmonic valve: The pulmonic valve was structurally normal appearing.  There was trace pulmonic insufficiency.    Transthoracic echo June 2019: Study Conclusions   - Left ventricle: The cavity size was normal. Wall thickness was  normal. Systolic function was normal. The estimated ejection  fraction was in the range of 60% to 65%. Wall motion was normal;  there were no regional wall motion abnormalities. Doppler  parameters are consistent with abnormal left ventricular  relaxation (grade 1 diastolic dysfunction).  - Aortic valve: There was trivial regurgitation.  - Atrial septum: There was an atrial septal aneurysm.  Impressions:   - Normal LV systolic function; mild diastolic dysfunction; trace  AI, MR and TR.   EKG:  EKG sinus rhythm with PACs.  Normal tracing with exception of vertical axis.  Recent Labs: No results found for requested labs within last 8760 hours.  Recent Lipid Panel    Component Value Date/Time   CHOL 161 02/27/2018 0518   TRIG 45 02/27/2018 0518   HDL 64 02/27/2018 0518   CHOLHDL 2.5 02/27/2018 0518   VLDL 9 02/27/2018 0518   LDLCALC 88 02/27/2018 0518    Physical Exam:    VS:  BP 110/64   Pulse 60   Ht 5' 3"  (1.6 m)   Wt 101 lb 3.2 oz (45.9 kg)   SpO2 99%   BMI 17.93 kg/m     Wt Readings from Last 3 Encounters:  07/23/20 101 lb 3.2 oz (45.9 kg)  07/13/19 99 lb 12.8 oz (45.3 kg)  12/13/18 96 lb (43.5 kg)     GEN: Slender.  Not frail.. No acute distress HEENT: Normal NECK: No JVD. LYMPHATICS: No lymphadenopathy CARDIAC:  RRR without murmur, gallop, or edema. VASCULAR:  Normal Pulses. No bruits. RESPIRATORY:  Clear to auscultation without rales, wheezing or rhonchi  ABDOMEN: Soft, non-tender, non-distended, No pulsatile mass, MUSCULOSKELETAL: No deformity  SKIN: Warm and dry NEUROLOGIC:  Alert and  oriented x 3 PSYCHIATRIC:  Normal affect   ASSESSMENT:    1. Coronary artery disease involving coronary bypass graft of native heart with angina pectoris ([place])   2. Mixed hyperlipidemia   3. ACE-inhibitor cough   4. Hyperlipidemia, unspecified hyperlipidemia type   5. Educated about COVID-19 virus infection    PLAN:    In order of problems listed above:  1. Secondary prevention discussed.  Metrics are being achieved for prevention. 2. Continue pravastatin and Praluent. 3. Not currently on an ACE inhibitor.  Should always be avoided in the future. 4. Vaccinated and boosted.  Practicing social distancing.  Overall education and awareness concerning primary/secondary risk prevention was discussed in detail: LDL less than 70, hemoglobin A1c less than 7, blood pressure target less than 130/80 mmHg, >150 minutes of moderate aerobic activity per week, avoidance of smoking, weight control (via diet and exercise), and continued surveillance/management of/for obstructive sleep apnea.    Medication Adjustments/Labs and Tests Ordered: Current medicines are reviewed at length with the patient today.  Concerns regarding medicines are outlined above.  Orders Placed This Encounter  Procedures  . EKG 12-Lead   No orders of the defined types were placed in this encounter.   Patient Instructions  Medication Instructions:  Your physician recommends that you continue on your current medications as directed. Please refer to the Current Medication list given to you today.  *If you need a refill on your cardiac medications before your next appointment, please call your pharmacy*   Lab Work: None If you have labs (blood work) drawn today and your tests are completely normal, you will receive your results only by: Marland Kitchen MyChart Message (if you have MyChart) OR . A paper copy in the mail If you have any lab test that is abnormal or we need to change your treatment, we will call you to review the  results.   Testing/Procedures: None   Follow-Up: At [facility], you and your health needs are our priority.  As part of our continuing mission to provide you with exceptional heart care, we have created designated Provider Care Teams.  These Care Teams include your primary  Cardiologist (physician) and Advanced Practice Providers (APPs -  Physician Assistants and Nurse Practitioners) who all work together to provide you with the care you need, when you need it.  We recommend signing up for the patient portal called "MyChart".  Sign up information is provided on this After Visit Summary.  MyChart is used to connect with patients for Virtual Visits (Telemedicine).  Patients are able to view lab/test results, encounter notes, upcoming appointments, etc.  Non-urgent messages can be sent to your provider as well.   To learn more about what you can do with MyChart, go to [email or web address].    Your next appointment:   12 month(s)  The format for your next appointment:   In Person  Provider:   You may see [name], MD or one of the following Advanced Practice Providers on your designated Care Team:    Truitt Merle, NP  Cecilie Kicks, NP  Kathyrn Drown, NP    Other Instructions      Signed, [name], MD  07/23/2020 4:15 PM    [place]

## 2020-07-23 NOTE — Patient Instructions (Signed)

## 2020-07-24 DIAGNOSIS — H93293 Other abnormal auditory perceptions, bilateral: Secondary | ICD-10-CM | POA: Diagnosis not present

## 2020-07-24 DIAGNOSIS — K219 Gastro-esophageal reflux disease without esophagitis: Secondary | ICD-10-CM | POA: Diagnosis not present

## 2020-07-24 DIAGNOSIS — J343 Hypertrophy of nasal turbinates: Secondary | ICD-10-CM | POA: Diagnosis not present

## 2020-07-24 DIAGNOSIS — R0982 Postnasal drip: Secondary | ICD-10-CM | POA: Diagnosis not present

## 2020-07-24 DIAGNOSIS — J3489 Other specified disorders of nose and nasal sinuses: Secondary | ICD-10-CM | POA: Diagnosis not present

## 2020-08-02 ENCOUNTER — Other Ambulatory Visit: Payer: Self-pay | Admitting: Interventional Cardiology

## 2020-08-13 DIAGNOSIS — H903 Sensorineural hearing loss, bilateral: Secondary | ICD-10-CM | POA: Diagnosis not present

## 2020-11-08 DIAGNOSIS — H52223 Regular astigmatism, bilateral: Secondary | ICD-10-CM | POA: Diagnosis not present

## 2020-11-08 DIAGNOSIS — I10 Essential (primary) hypertension: Secondary | ICD-10-CM | POA: Diagnosis not present

## 2020-11-08 DIAGNOSIS — H35031 Hypertensive retinopathy, right eye: Secondary | ICD-10-CM | POA: Diagnosis not present

## 2020-11-08 DIAGNOSIS — H5203 Hypermetropia, bilateral: Secondary | ICD-10-CM | POA: Diagnosis not present

## 2020-11-08 DIAGNOSIS — H35032 Hypertensive retinopathy, left eye: Secondary | ICD-10-CM | POA: Diagnosis not present

## 2020-11-08 DIAGNOSIS — H43813 Vitreous degeneration, bilateral: Secondary | ICD-10-CM | POA: Diagnosis not present

## 2020-11-08 DIAGNOSIS — H524 Presbyopia: Secondary | ICD-10-CM | POA: Diagnosis not present

## 2021-05-28 DIAGNOSIS — Z1231 Encounter for screening mammogram for malignant neoplasm of breast: Secondary | ICD-10-CM | POA: Diagnosis not present

## 2021-06-03 ENCOUNTER — Other Ambulatory Visit (HOSPITAL_BASED_OUTPATIENT_CLINIC_OR_DEPARTMENT_OTHER): Payer: Self-pay

## 2021-06-14 DIAGNOSIS — E785 Hyperlipidemia, unspecified: Secondary | ICD-10-CM | POA: Diagnosis not present

## 2021-06-14 DIAGNOSIS — M859 Disorder of bone density and structure, unspecified: Secondary | ICD-10-CM | POA: Diagnosis not present

## 2021-06-14 DIAGNOSIS — E0590 Thyrotoxicosis, unspecified without thyrotoxic crisis or storm: Secondary | ICD-10-CM | POA: Diagnosis not present

## 2021-06-21 DIAGNOSIS — I700 Atherosclerosis of aorta: Secondary | ICD-10-CM | POA: Diagnosis not present

## 2021-06-21 DIAGNOSIS — F17211 Nicotine dependence, cigarettes, in remission: Secondary | ICD-10-CM | POA: Diagnosis not present

## 2021-06-21 DIAGNOSIS — Z7189 Other specified counseling: Secondary | ICD-10-CM | POA: Diagnosis not present

## 2021-06-21 DIAGNOSIS — E785 Hyperlipidemia, unspecified: Secondary | ICD-10-CM | POA: Diagnosis not present

## 2021-06-21 DIAGNOSIS — Z1389 Encounter for screening for other disorder: Secondary | ICD-10-CM | POA: Diagnosis not present

## 2021-06-21 DIAGNOSIS — M4800 Spinal stenosis, site unspecified: Secondary | ICD-10-CM | POA: Diagnosis not present

## 2021-06-21 DIAGNOSIS — I25810 Atherosclerosis of coronary artery bypass graft(s) without angina pectoris: Secondary | ICD-10-CM | POA: Diagnosis not present

## 2021-06-21 DIAGNOSIS — M8580 Other specified disorders of bone density and structure, unspecified site: Secondary | ICD-10-CM | POA: Diagnosis not present

## 2021-06-21 DIAGNOSIS — Z0000 Encounter for general adult medical examination without abnormal findings: Secondary | ICD-10-CM | POA: Diagnosis not present

## 2021-06-21 DIAGNOSIS — Z1331 Encounter for screening for depression: Secondary | ICD-10-CM | POA: Diagnosis not present

## 2021-06-21 DIAGNOSIS — E0590 Thyrotoxicosis, unspecified without thyrotoxic crisis or storm: Secondary | ICD-10-CM | POA: Diagnosis not present

## 2021-06-21 DIAGNOSIS — M159 Polyosteoarthritis, unspecified: Secondary | ICD-10-CM | POA: Diagnosis not present

## 2021-06-21 DIAGNOSIS — R82998 Other abnormal findings in urine: Secondary | ICD-10-CM | POA: Diagnosis not present

## 2021-07-18 NOTE — Progress Notes (Signed)
Cardiology Office Note:    Date:  07/19/2021   ID:  Deborah [name], DOB [birth year], MRN [id number]  PCP:  Ginger Organ., MD   [facility] Providers Cardiologist:  [name], MD    Referring MD: Ginger Organ., MD   Chief Complaint:  Follow-up for CAD    Patient Profile:   Deborah [name] is a 75 y.o. female with:  Coronary artery disease NSTEMI in 6/19 s/p CABG (LIMA-LAD, SVG-Dx, SVG-PDA) Hyperlipidemia Graves' disease History of cough with ACE inhibitor  History of Present Illness: Ms. Melfi was last seen by Dr. Tamala Julian in 11/21.  She returns for follow-up.  She is here alone.  She lives at [facility].  She is independent.  She has not had any chest pain, shortness of breath, syncope, orthopnea, leg edema.  ASSESSMENT & PLAN:   CAD (coronary artery disease) History of non-STEMI in 2019 followed by CABG.  She is doing well without anginal symptoms.  Continue pravastatin 40 mg daily, Alirocumab 75 mg twice monthly, aspirin 81 mg daily, metoprolol tartrate 25 mg twice daily.  Follow-up in 1 year.  Hyperlipemia Recent labs from primary care reviewed.  06/14/2021: Total cholesterol 135, triglycerides 73, HDL 58, LDL 62, ALT 18, creatinine 0.7, potassium 4.6, hemoglobin 13.8.  LDL is optimal.  Continue Alirocumab 75 mg twice monthly, pravastatin 40 mg daily.  Elevated blood pressure reading Blood pressure somewhat above target.  Have asked her to continue to monitor blood pressures at home and notify us of her blood pressure readings are consistently 140/90 or higher.        Dispo:  Return in about 1 year (around 07/19/2022) for Routine Follow Up with Dr. Tamala Julian.    Prior CV studies: Intraoperative TEE 03/02/2018 EF 55-65, no PFO or ASD, trace AI 1+ MR, normal RVSP  Pre-CABG Dopplers 02/28/2018 Bilateral ICA 1-39  Echocardiogram 02/27/2018 EF 60-65, no RWMA, GR 1 DD, trivial AI, trivial MR, trivial TR  Cardiac catheterization 02/26/2018 Mid RCA  99 Ostial-proximal LAD 70-80     Past Medical History:  Diagnosis Date   Arthritis    "qwhere" (02/26/2018)   Cancer ([place])    "chest" (02/26/2018)   Coronary artery disease    Graves' disease    High cholesterol    Thyroid disease    Current Medications: Current Meds  Medication Sig   aspirin 81 MG tablet Take 81 mg by mouth daily.   Calcium Carb-Cholecalciferol (CALCIUM+D3 PO) Take 1 tablet by mouth 2 (two) times daily after a meal.   Cholecalciferol (VITAMIN D-3) 1000 units CAPS Take 1,000 Units by mouth at bedtime.   methimazole (TAPAZOLE) 5 MG tablet Take 5 mg by mouth See admin instructions. Take 5 mg by mouth at bedtime on Mon/Tues/Wed/Thurs/Fri/Sat   metoprolol tartrate (LOPRESSOR) 25 MG tablet Take 1 tablet (25 mg total) by mouth 2 (two) times daily.   Multiple Vitamins-Calcium (ONE-A-DAY WOMENS FORMULA) TABS Take 1 tablet by mouth daily.   PRALUENT 75 MG/ML SOAJ    pravastatin (PRAVACHOL) 40 MG tablet Take 40 mg by mouth at bedtime.    Allergies:   Atorvastatin, Simvastatin, and Rosuvastatin   Social History   Tobacco Use   Smoking status: Former    Packs/day: 0.50    Years: 30.00    Pack years: 15.00    Types: Cigarettes   Smokeless tobacco: Never   Tobacco comments:    02/26/2018 "nothing since before 2000"  Vaping Use   Vaping Use:  Never used  Substance Use Topics   Alcohol use: Yes    Alcohol/week: 6.0 standard drinks    Types: 6 Standard drinks or equivalent per week   Drug use: Never    Family Hx: The patient's family history includes CAD in her father; Hypercholesterolemia in her father and sister; Multiple myeloma in her brother; Osteoporosis in her mother.  Review of Systems  Cardiovascular:  Negative for claudication.    EKGs/Labs/Other Test Reviewed:    EKG:  EKG is  ordered today.  The ekg ordered today demonstrates NSR, HR 63, normal axis, septal Q waves, nonspecific ST-T wave changes, QTC 407  Recent Labs: No results found for requested  labs within last 8760 hours.   Recent Lipid Panel Lab Results  Component Value Date/Time   CHOL 161 02/27/2018 05:18 AM   TRIG 45 02/27/2018 05:18 AM   HDL 64 02/27/2018 05:18 AM   LDLCALC 88 02/27/2018 05:18 AM     Risk Assessment/Calculations:          Physical Exam:    VS:  BP (!) 140/56   Pulse 63   Ht 5' 3"  (1.6 m)   Wt 104 lb 6.4 oz (47.4 kg)   SpO2 98%   BMI 18.49 kg/m     Wt Readings from Last 3 Encounters:  07/19/21 104 lb 6.4 oz (47.4 kg)  07/23/20 101 lb 3.2 oz (45.9 kg)  07/13/19 99 lb 12.8 oz (45.3 kg)    Constitutional:      Appearance: Healthy appearance. Not in distress.  Neck:     Vascular: No JVR. JVD normal.  Pulmonary:     Effort: Pulmonary effort is normal.     Breath sounds: No wheezing. No rales.  Cardiovascular:     Normal rate. Regular rhythm. Normal S1. Normal S2.      Murmurs: There is no murmur.  Edema:    Peripheral edema absent.  Abdominal:     Palpations: Abdomen is soft.  Skin:    General: Skin is warm and dry.  Neurological:     Mental Status: Alert and oriented to person, place and time.     Cranial Nerves: Cranial nerves are intact.       Medication Adjustments/Labs and Tests Ordered: Current medicines are reviewed at length with the patient today.  Concerns regarding medicines are outlined above.  Tests Ordered: Orders Placed This Encounter  Procedures   EKG 12-Lead   Medication Changes: No orders of the defined types were placed in this encounter.  Signed, Richardson Dopp, PA-C  07/19/2021 11:28 AM    [facility] [place], [place], [place]  [address] Phone: [phone]; Fax: [phone]

## 2021-07-19 ENCOUNTER — Other Ambulatory Visit: Payer: Self-pay

## 2021-07-19 ENCOUNTER — Encounter: Payer: Self-pay | Admitting: Physician Assistant

## 2021-07-19 ENCOUNTER — Ambulatory Visit (INDEPENDENT_AMBULATORY_CARE_PROVIDER_SITE_OTHER): Payer: Medicare Other | Admitting: Physician Assistant

## 2021-07-19 VITALS — BP 140/56 | HR 63 | Ht 63.0 in | Wt 104.4 lb

## 2021-07-19 DIAGNOSIS — I2510 Atherosclerotic heart disease of native coronary artery without angina pectoris: Secondary | ICD-10-CM

## 2021-07-19 DIAGNOSIS — E782 Mixed hyperlipidemia: Secondary | ICD-10-CM

## 2021-07-19 DIAGNOSIS — R030 Elevated blood-pressure reading, without diagnosis of hypertension: Secondary | ICD-10-CM | POA: Diagnosis not present

## 2021-07-19 NOTE — Assessment & Plan Note (Signed)
History of non-STEMI in 2019 followed by CABG.  She is doing well without anginal symptoms.  Continue pravastatin 40 mg daily, Alirocumab 75 mg twice monthly, aspirin 81 mg daily, metoprolol tartrate 25 mg twice daily.  Follow-up in 1 year.

## 2021-07-19 NOTE — Assessment & Plan Note (Signed)
Blood pressure somewhat above target.  Have asked her to continue to monitor blood pressures at home and notify us of her blood pressure readings are consistently 140/90 or higher.

## 2021-07-19 NOTE — Assessment & Plan Note (Signed)
Recent labs from primary care reviewed.  06/14/2021: Total cholesterol 135, triglycerides 73, HDL 58, LDL 62, ALT 18, creatinine 0.7, potassium 4.6, hemoglobin 13.8.  LDL is optimal.  Continue Alirocumab 75 mg twice monthly, pravastatin 40 mg daily.

## 2021-07-19 NOTE — Patient Instructions (Signed)
Medication Instructions:   Your physician recommends that you continue on your current medications as directed. Please refer to the Current Medication list given to you today.  *If you need a refill on your cardiac medications before your next appointment, please call your pharmacy*   Lab Work:  -NONE  If you have labs (blood work) drawn today and your tests are completely normal, you will receive your results only by: [place] (if you have MyChart) OR A paper copy in the mail If you have any lab test that is abnormal or we need to change your treatment, we will call you to review the results.   Testing/Procedures:  -NONE   Follow-Up: At [facility], you and your health needs are our priority.  As part of our continuing mission to provide you with exceptional heart care, we have created designated Provider Care Teams.  These Care Teams include your primary Cardiologist (physician) and Advanced Practice Providers (APPs -  Physician Assistants and Nurse Practitioners) who all work together to provide you with the care you need, when you need it.  We recommend signing up for the patient portal called "MyChart".  Sign up information is provided on this After Visit Summary.  MyChart is used to connect with patients for Virtual Visits (Telemedicine).  Patients are able to view lab/test results, encounter notes, upcoming appointments, etc.  Non-urgent messages can be sent to your provider as well.   To learn more about what you can do with MyChart, go to [email or web address].    Your next appointment:   1 year(s)  The format for your next appointment:   In Person  Provider:   [name], MD     Other Instructions  Your physician wants you to follow-up in: 1 year with Dr.Smith.  You will receive a reminder letter in the mail two months in advance. If you don't receive a letter, please call our office to schedule the follow-up appointment.  Please keep a eye on  your blood pressure call if X 3 days stays above >140/90

## 2021-09-10 DIAGNOSIS — Z85828 Personal history of other malignant neoplasm of skin: Secondary | ICD-10-CM | POA: Diagnosis not present

## 2021-09-10 DIAGNOSIS — L814 Other melanin hyperpigmentation: Secondary | ICD-10-CM | POA: Diagnosis not present

## 2021-09-10 DIAGNOSIS — D485 Neoplasm of uncertain behavior of skin: Secondary | ICD-10-CM | POA: Diagnosis not present

## 2021-09-10 DIAGNOSIS — L821 Other seborrheic keratosis: Secondary | ICD-10-CM | POA: Diagnosis not present

## 2021-09-10 DIAGNOSIS — L570 Actinic keratosis: Secondary | ICD-10-CM | POA: Diagnosis not present

## 2021-10-03 ENCOUNTER — Other Ambulatory Visit: Payer: Self-pay | Admitting: Interventional Cardiology

## 2021-11-21 DIAGNOSIS — H43813 Vitreous degeneration, bilateral: Secondary | ICD-10-CM | POA: Diagnosis not present

## 2021-11-21 DIAGNOSIS — H524 Presbyopia: Secondary | ICD-10-CM | POA: Diagnosis not present

## 2021-11-21 DIAGNOSIS — H52223 Regular astigmatism, bilateral: Secondary | ICD-10-CM | POA: Diagnosis not present

## 2021-11-21 DIAGNOSIS — H35033 Hypertensive retinopathy, bilateral: Secondary | ICD-10-CM | POA: Diagnosis not present

## 2021-11-21 DIAGNOSIS — I10 Essential (primary) hypertension: Secondary | ICD-10-CM | POA: Diagnosis not present

## 2021-11-21 DIAGNOSIS — H5203 Hypermetropia, bilateral: Secondary | ICD-10-CM | POA: Diagnosis not present

## 2022-02-05 DIAGNOSIS — H903 Sensorineural hearing loss, bilateral: Secondary | ICD-10-CM | POA: Diagnosis not present

## 2022-04-08 DIAGNOSIS — E785 Hyperlipidemia, unspecified: Secondary | ICD-10-CM | POA: Diagnosis not present

## 2022-04-08 DIAGNOSIS — E0590 Thyrotoxicosis, unspecified without thyrotoxic crisis or storm: Secondary | ICD-10-CM | POA: Diagnosis not present

## 2022-06-03 ENCOUNTER — Other Ambulatory Visit (HOSPITAL_BASED_OUTPATIENT_CLINIC_OR_DEPARTMENT_OTHER): Payer: Self-pay

## 2022-06-03 DIAGNOSIS — Z1231 Encounter for screening mammogram for malignant neoplasm of breast: Secondary | ICD-10-CM | POA: Diagnosis not present

## 2022-06-03 MED ORDER — FLUAD QUADRIVALENT 0.5 ML IM PRSY
PREFILLED_SYRINGE | INTRAMUSCULAR | 0 refills | Status: DC
  Filled 2022-06-03: qty 0.5, 1d supply, fill #0

## 2022-06-04 ENCOUNTER — Other Ambulatory Visit (HOSPITAL_BASED_OUTPATIENT_CLINIC_OR_DEPARTMENT_OTHER): Payer: Self-pay

## 2022-06-04 DIAGNOSIS — Z23 Encounter for immunization: Secondary | ICD-10-CM | POA: Diagnosis not present

## 2022-06-11 ENCOUNTER — Other Ambulatory Visit (HOSPITAL_BASED_OUTPATIENT_CLINIC_OR_DEPARTMENT_OTHER): Payer: Self-pay

## 2022-06-13 ENCOUNTER — Other Ambulatory Visit (HOSPITAL_BASED_OUTPATIENT_CLINIC_OR_DEPARTMENT_OTHER): Payer: Self-pay

## 2022-06-19 ENCOUNTER — Other Ambulatory Visit (HOSPITAL_BASED_OUTPATIENT_CLINIC_OR_DEPARTMENT_OTHER): Payer: Self-pay

## 2022-07-03 DIAGNOSIS — E785 Hyperlipidemia, unspecified: Secondary | ICD-10-CM | POA: Diagnosis not present

## 2022-07-03 DIAGNOSIS — M8589 Other specified disorders of bone density and structure, multiple sites: Secondary | ICD-10-CM | POA: Diagnosis not present

## 2022-07-03 DIAGNOSIS — E0590 Thyrotoxicosis, unspecified without thyrotoxic crisis or storm: Secondary | ICD-10-CM | POA: Diagnosis not present

## 2022-07-03 DIAGNOSIS — M8580 Other specified disorders of bone density and structure, unspecified site: Secondary | ICD-10-CM | POA: Diagnosis not present

## 2022-07-03 DIAGNOSIS — R7989 Other specified abnormal findings of blood chemistry: Secondary | ICD-10-CM | POA: Diagnosis not present

## 2022-07-09 DIAGNOSIS — I25810 Atherosclerosis of coronary artery bypass graft(s) without angina pectoris: Secondary | ICD-10-CM | POA: Diagnosis not present

## 2022-07-09 DIAGNOSIS — I700 Atherosclerosis of aorta: Secondary | ICD-10-CM | POA: Diagnosis not present

## 2022-07-09 DIAGNOSIS — M159 Polyosteoarthritis, unspecified: Secondary | ICD-10-CM | POA: Diagnosis not present

## 2022-07-09 DIAGNOSIS — Z7189 Other specified counseling: Secondary | ICD-10-CM | POA: Diagnosis not present

## 2022-07-09 DIAGNOSIS — E785 Hyperlipidemia, unspecified: Secondary | ICD-10-CM | POA: Diagnosis not present

## 2022-07-09 DIAGNOSIS — R140 Abdominal distension (gaseous): Secondary | ICD-10-CM | POA: Diagnosis not present

## 2022-07-09 DIAGNOSIS — Z0000 Encounter for general adult medical examination without abnormal findings: Secondary | ICD-10-CM | POA: Diagnosis not present

## 2022-07-09 DIAGNOSIS — E0590 Thyrotoxicosis, unspecified without thyrotoxic crisis or storm: Secondary | ICD-10-CM | POA: Diagnosis not present

## 2022-07-09 DIAGNOSIS — M8580 Other specified disorders of bone density and structure, unspecified site: Secondary | ICD-10-CM | POA: Diagnosis not present

## 2022-07-09 DIAGNOSIS — R82998 Other abnormal findings in urine: Secondary | ICD-10-CM | POA: Diagnosis not present

## 2022-07-09 DIAGNOSIS — F17211 Nicotine dependence, cigarettes, in remission: Secondary | ICD-10-CM | POA: Diagnosis not present

## 2022-07-09 DIAGNOSIS — Z1339 Encounter for screening examination for other mental health and behavioral disorders: Secondary | ICD-10-CM | POA: Diagnosis not present

## 2022-07-09 DIAGNOSIS — Z1331 Encounter for screening for depression: Secondary | ICD-10-CM | POA: Diagnosis not present

## 2022-07-11 ENCOUNTER — Other Ambulatory Visit (HOSPITAL_BASED_OUTPATIENT_CLINIC_OR_DEPARTMENT_OTHER): Payer: Self-pay

## 2022-07-17 ENCOUNTER — Other Ambulatory Visit (HOSPITAL_BASED_OUTPATIENT_CLINIC_OR_DEPARTMENT_OTHER): Payer: Self-pay

## 2022-07-21 ENCOUNTER — Other Ambulatory Visit (HOSPITAL_BASED_OUTPATIENT_CLINIC_OR_DEPARTMENT_OTHER): Payer: Self-pay

## 2022-07-23 ENCOUNTER — Other Ambulatory Visit (HOSPITAL_BASED_OUTPATIENT_CLINIC_OR_DEPARTMENT_OTHER): Payer: Self-pay

## 2022-07-28 ENCOUNTER — Other Ambulatory Visit (HOSPITAL_BASED_OUTPATIENT_CLINIC_OR_DEPARTMENT_OTHER): Payer: Self-pay

## 2022-07-31 ENCOUNTER — Encounter: Payer: Self-pay | Admitting: Physician Assistant

## 2022-07-31 NOTE — Progress Notes (Signed)
Cardiology Office Note    Date:  08/06/2022   ID:  Deborah [name], [name] [birth year], MRN [id number]  PCP:  Ginger Organ., MD  Cardiologist:  [name], MD  Electrophysiologist:  None   Chief Complaint: f/u CAD  History of Present Illness:   Deborah [name]. Freer is a 76 y.o. 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 Last full echo in 2019 showed EF 60-65%, G1DD, + atrial septal aneurysm, trivial AI, MR, TR. TEE at time of surgery showed 1+ MR/TR.  She returns for follow-up overall doing well. About 1-2 months ago she reports she saw PCP for feelings of sluggishness and soft BPs at home in low 100s and metoprolol was cut down to 1/2 tablet twice a day. She had bloodwork at that time but we do not have the results. She reports she's done well since that time and remains physically active without any complaints of CP or dyspnea.  Labwork independently reviewed: Scan 06/2021 LDL 62, trig 73, K 4.8, Cr 0.70, LFTs wnl, Hgb 13.8, plt OK, TSH wnl   Cardiology Studies:   Studies reviewed are outlined and summarized above. Reports included below if pertinent.   TEE 03/2018  Mitral valve: The mitral leaflets were moderately thickened. The  anterior leaflet was billowing but there were no prolapsing or flail  leaflet segments noted. There was 1+ mitral insufficiency.   Right ventricle: Right ventricular cavity was normal in size. There was  normal right ventricular systolic function. The post bypass exam was  normal right ventricular systolic function.   Tricuspid valve: Tricuspid valve is structurally normal. There was 1+  tricuspid insufficiency.   Pulmonic valve: The pulmonic valve was structurally normal appearing.  There was trace pulmonic  insufficiency.   Duplex 2019  Final Interpretation:  Right Carotid: Velocities in the right ICA are consistent with a 1-39%  stenosis.   Left Carotid: Velocities in the left ICA are consistent with a 1-39%  stenosis.  Vertebrals: Bilateral vertebral arteries demonstrate antegrade flow.  Subclavians: Normal flow hemodynamics were seen in bilateral subclavian               arteries.   Lower extremity arterial evaluation: Pedal waveforms are audibly  triphasic.     2D echo 01/2018   - Left ventricle: The cavity size was normal. Wall thickness was    normal. Systolic function was normal. The estimated ejection    fraction was in the range of 60% to 65%. Wall motion was normal;    there were no regional wall motion abnormalities. Doppler    parameters are consistent with abnormal left ventricular    relaxation (grade 1 diastolic dysfunction).  - Aortic valve: There was trivial regurgitation.  - Atrial septum: There was an atrial septal aneurysm.   Impressions:   - Normal LV systolic function; mild diastolic dysfunction; trace    AI, MR and TR. Listed as trivial in full report  Cath 01/2018 Culprit lesion: Mid RCA lesion is 95% stenosed. Heavily calcified irregular lesion Prox RCA to Mid RCA lesion is 25% stenosed. Dist RCA lesion is 45% stenosed. Prox LAD-1 lesion is 85% stenosed. - Prox LAD-2 lesion is 55% stenosed. - Prox LAD-3 lesion is 70% stenosed. (tandem lesions) The left ventricular systolic function is  normal. The left ventricular ejection fraction is greater than 65% by visual estimate. LV end diastolic pressure is normal. There is mild (2+) mitral regurgitation.   Heavily calcified coronary arteries with severe mid RCA 99% stenosis and diffuse 70 to 80% ostial-proximal LAD stenosis. Normal LV function and EDP.   Patient has severe disease.  We will consult CT surgery.   She will be admitted to the stepdown unit.  Placed on IV heparin.  If necessary IV  nitroglycerin.   Continue risk factor modification - aspirin, statin, beta-blocker etc.            Past Medical History:  Diagnosis Date   Arthritis    "qwhere" (02/26/2018)   Atrial septal aneurysm    Cancer ([place])    "chest" (02/26/2018)   Coronary artery disease    Graves' disease    High cholesterol    Mild carotid artery disease (HCC)    Mild mitral regurgitation    Thyroid disease     Past Surgical History:  Procedure Laterality Date   ANTERIOR CRUCIATE LIGAMENT REPAIR Left    BASAL CELL CARCINOMA EXCISION     "chest" (02/26/2018)   CARDIAC CATHETERIZATION     CARPAL TUNNEL RELEASE Right    CATARACT EXTRACTION W/ INTRAOCULAR LENS  IMPLANT, BILATERAL Bilateral    CORONARY ARTERY BYPASS GRAFT N/A 03/02/2018   Procedure: CORONARY ARTERY BYPASS GRAFTING (CABG);  Surgeon: Grace Isaac, MD;  Location: [facility];  Service: Open Heart Surgery;  Laterality: N/A;  Times 3 using left internal mammary artery to LAD and endoscopically harvested left saphenous vein to Diagonal and PDA.   DILATION AND CURETTAGE OF UTERUS     LEFT HEART CATH AND CORONARY ANGIOGRAPHY N/A 02/26/2018   Procedure: LEFT HEART CATH AND CORONARY ANGIOGRAPHY;  Surgeon: Leonie Man, MD;  Location: [facility];  Service: Cardiovascular;  Laterality: N/A;   RIGHT OOPHORECTOMY Right 1970s   SHOULDER ARTHROSCOPY W/ ROTATOR CUFF REPAIR Bilateral    TEE WITHOUT CARDIOVERSION N/A 03/02/2018   Procedure: TRANSESOPHAGEAL ECHOCARDIOGRAM (TEE);  Surgeon: Grace Isaac, MD;  Location: [facility];  Service: Open Heart Surgery;  Laterality: N/A;   TONSILLECTOMY     TRIGGER FINGER RELEASE Bilateral    "8 of them"    Current Medications: Current Meds  Medication Sig   aspirin 81 MG tablet Take 81 mg by mouth daily.   Calcium Carb-Cholecalciferol (CALCIUM+D3 PO) Take 1 tablet by mouth 2 (two) times daily after a meal.   Cholecalciferol (VITAMIN D-3) 1000 units CAPS Take 1,000 Units by mouth at bedtime.    methimazole (TAPAZOLE) 5 MG tablet Take 5 mg by mouth 4 (four) times a week.   metoprolol tartrate (LOPRESSOR) 25 MG tablet Take 12.5 mg by mouth 2 (two) times daily.   Multiple Vitamins-Calcium (ONE-A-DAY WOMENS FORMULA) TABS Take 1 tablet by mouth daily.   PRALUENT 75 MG/ML SOAJ Inject 1 Syringe into the skin every 14 (fourteen) days.   pravastatin (PRAVACHOL) 40 MG tablet Take 40 mg by mouth at bedtime.     Allergies:   Atorvastatin, Simvastatin, and Rosuvastatin   Social History   Socioeconomic History   Marital status: Widowed    Spouse name: John   Number of children: 0   Years of education: Not on file   Highest education level: Not on file  Occupational History   Not on file  Tobacco Use   Smoking status: Former    Packs/day: 0.50    Years: 30.00  Total pack years: 15.00    Types: Cigarettes   Smokeless tobacco: Never   Tobacco comments:    02/26/2018 "nothing since before 2000"  Vaping Use   Vaping Use: Never used  Substance and Sexual Activity   Alcohol use: Yes    Alcohol/week: 6.0 standard drinks of alcohol    Types: 6 Standard drinks or equivalent per week   Drug use: Never   Sexual activity: Not Currently  Other Topics Concern   Not on file  Social History Narrative   Not on file   Social Determinants of Health   Financial Resource Strain: Low Risk  (05/04/2018)   Overall Financial Resource Strain (CARDIA)    Difficulty of Paying Living Expenses: Not hard at all  Food Insecurity: No Food Insecurity (05/04/2018)   Hunger Vital Sign    Worried About Running Out of Food in the Last Year: Never true    Ran Out of Food in the Last Year: Never true  Transportation Needs: No Transportation Needs (05/04/2018)   PRAPARE - Hydrologist (Medical): No    Lack of Transportation (Non-Medical): No  Physical Activity: Sufficiently Active (05/04/2018)   Exercise Vital Sign    Days of Exercise per Week: 7 days    Minutes of Exercise per  Session: 30 min  Stress: Stress Concern Present (05/04/2018)   [place]    Feeling of Stress : To some extent  Social Connections: Not on file     Family History:  The patient's family history includes CAD in her father; Hypercholesterolemia in her father and sister; Multiple myeloma in her brother; Osteoporosis in her mother.  ROS:   Please see the history of present illness.  All other systems are reviewed and otherwise negative.    EKG(s)/Additional Labs   EKG:  EKG is ordered today, personally reviewed, demonstrating NSR 66bpm, nonspecific TW changes, similar to prior  Recent Labs: No results found for requested labs within last 365 days.  Recent Lipid Panel    Component Value Date/Time   CHOL 161 02/27/2018 0518   TRIG 45 02/27/2018 0518   HDL 64 02/27/2018 0518   CHOLHDL 2.5 02/27/2018 0518   VLDL 9 02/27/2018 0518   LDLCALC 88 02/27/2018 0518    PHYSICAL EXAM:    VS:  BP (!) 140/70 (BP Location: Left Arm, Patient Position: Sitting, Cuff Size: Normal)   Pulse 66   Ht _0  (1.6 m)   Wt 101 lb (45.8 kg)   SpO2 97%   BMI 17.89 kg/m   BMI: Body mass index is 17.89 kg/m.  GEN: Well nourished, well developed female in no acute distress HEENT: normocephalic, atraumatic Neck: no JVD, carotid bruits, or masses Cardiac: RRR; very soft SEM LSB, no rubs or gallops, no edema  Respiratory:  clear to auscultation bilaterally, normal work of breathing GI: soft, nontender, nondistended, + BS MS: no deformity or atrophy Skin: warm and dry, no rash Neuro:  Alert and Oriented x 3, Strength and sensation are intact, follows commands Psych: euthymic mood, full affect  Wt Readings from Last 3 Encounters:  08/06/22 101 lb (45.8 kg)  07/19/21 104 lb 6.4 oz (47.4 kg)  07/23/20 101 lb 3.2 oz (45.9 kg)     ASSESSMENT & PLAN:   1. CAD, HLD - clinically doing well. Continue ASA, metoprolol, pravastatin and  Praluent at present doses. Intolerant of higher intensity statins in the past. Have asked CMA  to get copy of labs from PCP for our records.  2. Mild MR, TR - due for f/u echo, will arrange. Last assessed 2019. Soft murmur on exam, possibly TR. No concerning symptoms at this time.  3. Carotid artery disease - due for recheck, will arrange duplex.  4. Atrial septal aneurysm - previously noted on workup, not of any acute concern at this time. Incidental findings.  5. Elevated blood pressure reading without h/o HTN - she reports BB was decreased due to low BP earlier this fall. Initial BP 140/70. Recheck BP by me was normal at 124/60. No changes made at this time.    Disposition: F/u to establish with new cardiologist in our group in 1 year to establish care upon Dr. Thompson Caul retirement.   Medication Adjustments/Labs and Tests Ordered: Current medicines are reviewed at length with the patient today.  Concerns regarding medicines are outlined above. Medication changes, Labs and Tests ordered today are summarized above and listed in the Patient Instructions accessible in Encounters.   Signed, Charlie Pitter, PA-C  08/06/2022 10:09 AM    [facility] Phone: [phone]; Fax: [phone]

## 2022-08-06 ENCOUNTER — Encounter: Payer: Self-pay | Admitting: Physician Assistant

## 2022-08-06 ENCOUNTER — Ambulatory Visit: Payer: Medicare Other | Attending: Physician Assistant | Admitting: Physician Assistant

## 2022-08-06 VITALS — BP 124/60 | HR 66 | Ht 63.0 in | Wt 101.0 lb

## 2022-08-06 DIAGNOSIS — I253 Aneurysm of heart: Secondary | ICD-10-CM

## 2022-08-06 DIAGNOSIS — R030 Elevated blood-pressure reading, without diagnosis of hypertension: Secondary | ICD-10-CM

## 2022-08-06 DIAGNOSIS — I071 Rheumatic tricuspid insufficiency: Secondary | ICD-10-CM

## 2022-08-06 DIAGNOSIS — I340 Nonrheumatic mitral (valve) insufficiency: Secondary | ICD-10-CM | POA: Diagnosis not present

## 2022-08-06 DIAGNOSIS — I2510 Atherosclerotic heart disease of native coronary artery without angina pectoris: Secondary | ICD-10-CM

## 2022-08-06 DIAGNOSIS — I779 Disorder of arteries and arterioles, unspecified: Secondary | ICD-10-CM

## 2022-08-06 NOTE — Patient Instructions (Signed)
Medication Instructions:   Your physician recommends that you continue on your current medications as directed. Please refer to the Current Medication list given to you today.  *If you need a refill on your cardiac medications before your next appointment, please call your pharmacy*   Lab Work: [place]   If you have labs (blood work) drawn today and your tests are completely normal, you will receive your results only by: [place] (if you have MyChart) OR A paper copy in the mail If you have any lab test that is abnormal or we need to change your treatment, we will call you to review the results.   Testing/Procedures: Your physician has requested that you have an echocardiogram. Echocardiography is a painless test that uses sound waves to create images of your heart. It provides your doctor with information about the size and shape of your heart and how well your heart's chambers and valves are working. This procedure takes approximately one hour. There are no restrictions for this procedure. Please do NOT wear cologne, perfume, aftershave, or lotions (deodorant is allowed). Please arrive 15 minutes prior to your appointment time.   Your physician has requested that you have a carotid duplex. This test is an ultrasound of the carotid arteries in your neck. It looks at blood flow through these arteries that supply the brain with blood. Allow one hour for this exam. There are no restrictions or special instructions.    Follow-Up: At [facility], you and your health needs are our priority.  As part of our continuing mission to provide you with exceptional heart care, we have created designated Provider Care Teams.  These Care Teams include your primary Cardiologist (physician) and Advanced Practice Providers (APPs -  Physician Assistants and Nurse Practitioners) who all work together to provide you with the care you need, when you need it.  We recommend signing up  for the patient portal called "MyChart".  Sign up information is provided on this After Visit Summary.  MyChart is used to connect with patients for Virtual Visits (Telemedicine).  Patients are able to view lab/test results, encounter notes, upcoming appointments, etc.  Non-urgent messages can be sent to your provider as well.   To learn more about what you can do with MyChart, go to [email or web address].    Your next appointment:   1 year(s)  The format for your next appointment:   In Person  Provider:   [name], MD  ( RECALL NEW MD TAKING OVER PATIENTS)   Other Instructions   Important Information About Sugar

## 2022-08-07 ENCOUNTER — Encounter: Payer: Self-pay | Admitting: Physician Assistant

## 2022-08-07 ENCOUNTER — Telehealth: Payer: Self-pay | Admitting: Physician Assistant

## 2022-08-07 NOTE — Telephone Encounter (Addendum)
Opened in error. See documentation note.

## 2022-08-07 NOTE — Progress Notes (Addendum)
   Received scanned labs from primary care from 07/2022    CMET 07/03/22 K 4.5, Cr 0.6, LFTs ok, Na 130, Hgb 13.3, plt 189, LDL 55, trig 77, apo-B 52, TSH 2.59, FT4 1.1, vit D 82.7.    Per lab review has prior history of mild hyponatremia, waxing/waning, therefore will defer to PCP to manage since she is not on any cardiac meds to cause this. Was euvolemic at recent OV. Will fax copy of this note as FYI to PCP.   Charlie Pitter, PA-C

## 2022-09-10 ENCOUNTER — Ambulatory Visit (HOSPITAL_BASED_OUTPATIENT_CLINIC_OR_DEPARTMENT_OTHER): Payer: Medicare Other

## 2022-09-10 DIAGNOSIS — L570 Actinic keratosis: Secondary | ICD-10-CM | POA: Diagnosis not present

## 2022-09-10 DIAGNOSIS — I340 Nonrheumatic mitral (valve) insufficiency: Secondary | ICD-10-CM

## 2022-09-10 DIAGNOSIS — I2510 Atherosclerotic heart disease of native coronary artery without angina pectoris: Secondary | ICD-10-CM | POA: Diagnosis not present

## 2022-09-10 DIAGNOSIS — Z85828 Personal history of other malignant neoplasm of skin: Secondary | ICD-10-CM | POA: Diagnosis not present

## 2022-09-10 DIAGNOSIS — D234 Other benign neoplasm of skin of scalp and neck: Secondary | ICD-10-CM | POA: Diagnosis not present

## 2022-09-10 DIAGNOSIS — D485 Neoplasm of uncertain behavior of skin: Secondary | ICD-10-CM | POA: Diagnosis not present

## 2022-09-10 DIAGNOSIS — L821 Other seborrheic keratosis: Secondary | ICD-10-CM | POA: Diagnosis not present

## 2022-09-10 LAB — ECHOCARDIOGRAM COMPLETE
Area-P 1/2: 3.12 cm2
P 1/2 time: 424 msec
S' Lateral: 2.2 cm

## 2022-09-11 ENCOUNTER — Ambulatory Visit (HOSPITAL_COMMUNITY)
Admission: RE | Admit: 2022-09-11 | Discharge: 2022-09-11 | Disposition: A | Discharge status: Home / Self Care | Payer: Medicare Other | Source: Ambulatory Visit | Attending: Internal Medicine | Admitting: Internal Medicine

## 2022-09-11 ENCOUNTER — Telehealth: Payer: Self-pay

## 2022-09-11 DIAGNOSIS — I253 Aneurysm of heart: Secondary | ICD-10-CM

## 2022-09-11 DIAGNOSIS — I2510 Atherosclerotic heart disease of native coronary artery without angina pectoris: Secondary | ICD-10-CM | POA: Diagnosis not present

## 2022-09-11 DIAGNOSIS — I340 Nonrheumatic mitral (valve) insufficiency: Secondary | ICD-10-CM | POA: Insufficient documentation

## 2022-09-11 NOTE — Telephone Encounter (Signed)
Spoke with patient to review Echocardiogram results and recommendations from [facility], [place].   Patient states she had her carotid US today and her BP was 100/50. She wanted to know if she should be concerned. She is asymptomatic. I advised that if she is not feeling lightheaded, nauseated or other symptoms that this BP is good. Also advised to record BP 2 times a day for a week, notate if she is having any symptoms and send them if for [place] to review. Advised her to let us know if anything changes.  Patient verbalized understanding and had no questions.

## 2022-09-15 DIAGNOSIS — M1712 Unilateral primary osteoarthritis, left knee: Secondary | ICD-10-CM | POA: Diagnosis not present

## 2022-09-15 DIAGNOSIS — M25562 Pain in left knee: Secondary | ICD-10-CM | POA: Diagnosis not present

## 2022-09-24 ENCOUNTER — Other Ambulatory Visit: Payer: Self-pay | Admitting: Interventional Cardiology

## 2022-10-23 DIAGNOSIS — M25762 Osteophyte, left knee: Secondary | ICD-10-CM | POA: Diagnosis not present

## 2022-10-23 DIAGNOSIS — M25362 Other instability, left knee: Secondary | ICD-10-CM | POA: Diagnosis not present

## 2022-10-23 DIAGNOSIS — M1712 Unilateral primary osteoarthritis, left knee: Secondary | ICD-10-CM | POA: Diagnosis not present

## 2022-10-27 DIAGNOSIS — M1712 Unilateral primary osteoarthritis, left knee: Secondary | ICD-10-CM | POA: Diagnosis not present

## 2022-11-26 DIAGNOSIS — Z9849 Cataract extraction status, unspecified eye: Secondary | ICD-10-CM | POA: Diagnosis not present

## 2022-11-26 DIAGNOSIS — H52223 Regular astigmatism, bilateral: Secondary | ICD-10-CM | POA: Diagnosis not present

## 2022-11-26 DIAGNOSIS — H53143 Visual discomfort, bilateral: Secondary | ICD-10-CM | POA: Diagnosis not present

## 2022-11-26 DIAGNOSIS — Z961 Presence of intraocular lens: Secondary | ICD-10-CM | POA: Diagnosis not present

## 2022-11-26 DIAGNOSIS — I10 Essential (primary) hypertension: Secondary | ICD-10-CM | POA: Diagnosis not present

## 2022-11-26 DIAGNOSIS — H43813 Vitreous degeneration, bilateral: Secondary | ICD-10-CM | POA: Diagnosis not present

## 2022-11-26 DIAGNOSIS — H35033 Hypertensive retinopathy, bilateral: Secondary | ICD-10-CM | POA: Diagnosis not present

## 2022-11-26 DIAGNOSIS — H524 Presbyopia: Secondary | ICD-10-CM | POA: Diagnosis not present

## 2022-11-26 DIAGNOSIS — H5203 Hypermetropia, bilateral: Secondary | ICD-10-CM | POA: Diagnosis not present

## 2022-12-07 ENCOUNTER — Inpatient Hospital Stay (HOSPITAL_COMMUNITY): Payer: Medicare Other | Admitting: Anesthesiology

## 2022-12-07 ENCOUNTER — Emergency Department (HOSPITAL_BASED_OUTPATIENT_CLINIC_OR_DEPARTMENT_OTHER): Payer: Medicare Other

## 2022-12-07 ENCOUNTER — Encounter (HOSPITAL_COMMUNITY): Payer: Self-pay

## 2022-12-07 ENCOUNTER — Inpatient Hospital Stay (HOSPITAL_BASED_OUTPATIENT_CLINIC_OR_DEPARTMENT_OTHER)
Admission: EM | Admit: 2022-12-07 | Discharge: 2022-12-18 | DRG: 329 | Disposition: A | Discharge status: Skilled Nursing Facility | Payer: Medicare Other | Attending: General Surgery | Admitting: General Surgery

## 2022-12-07 ENCOUNTER — Other Ambulatory Visit: Payer: Self-pay

## 2022-12-07 ENCOUNTER — Encounter (HOSPITAL_COMMUNITY): Admission: EM | Disposition: A | Discharge status: Skilled Nursing Facility | Payer: Self-pay | Source: Home / Self Care

## 2022-12-07 DIAGNOSIS — K9189 Other postprocedural complications and disorders of digestive system: Secondary | ICD-10-CM | POA: Diagnosis not present

## 2022-12-07 DIAGNOSIS — Z7982 Long term (current) use of aspirin: Secondary | ICD-10-CM | POA: Diagnosis not present

## 2022-12-07 DIAGNOSIS — E7800 Pure hypercholesterolemia, unspecified: Secondary | ICD-10-CM | POA: Diagnosis present

## 2022-12-07 DIAGNOSIS — K5989 Other specified functional intestinal disorders: Secondary | ICD-10-CM | POA: Diagnosis not present

## 2022-12-07 DIAGNOSIS — Z85828 Personal history of other malignant neoplasm of skin: Secondary | ICD-10-CM

## 2022-12-07 DIAGNOSIS — K660 Peritoneal adhesions (postprocedural) (postinfection): Secondary | ICD-10-CM | POA: Diagnosis present

## 2022-12-07 DIAGNOSIS — R32 Unspecified urinary incontinence: Secondary | ICD-10-CM | POA: Diagnosis not present

## 2022-12-07 DIAGNOSIS — N390 Urinary tract infection, site not specified: Secondary | ICD-10-CM | POA: Diagnosis not present

## 2022-12-07 DIAGNOSIS — I10 Essential (primary) hypertension: Secondary | ICD-10-CM

## 2022-12-07 DIAGNOSIS — Z888 Allergy status to other drugs, medicaments and biological substances status: Secondary | ICD-10-CM

## 2022-12-07 DIAGNOSIS — Z83438 Family history of other disorder of lipoprotein metabolism and other lipidemia: Secondary | ICD-10-CM | POA: Diagnosis not present

## 2022-12-07 DIAGNOSIS — E876 Hypokalemia: Secondary | ICD-10-CM | POA: Diagnosis not present

## 2022-12-07 DIAGNOSIS — E43 Unspecified severe protein-calorie malnutrition: Secondary | ICD-10-CM | POA: Diagnosis present

## 2022-12-07 DIAGNOSIS — Z79899 Other long term (current) drug therapy: Secondary | ICD-10-CM

## 2022-12-07 DIAGNOSIS — K567 Ileus, unspecified: Secondary | ICD-10-CM | POA: Diagnosis not present

## 2022-12-07 DIAGNOSIS — Z951 Presence of aortocoronary bypass graft: Secondary | ICD-10-CM

## 2022-12-07 DIAGNOSIS — K56609 Unspecified intestinal obstruction, unspecified as to partial versus complete obstruction: Principal | ICD-10-CM | POA: Diagnosis present

## 2022-12-07 DIAGNOSIS — I252 Old myocardial infarction: Secondary | ICD-10-CM | POA: Diagnosis not present

## 2022-12-07 DIAGNOSIS — E871 Hypo-osmolality and hyponatremia: Secondary | ICD-10-CM | POA: Diagnosis not present

## 2022-12-07 DIAGNOSIS — K219 Gastro-esophageal reflux disease without esophagitis: Secondary | ICD-10-CM | POA: Diagnosis present

## 2022-12-07 DIAGNOSIS — Z87891 Personal history of nicotine dependence: Secondary | ICD-10-CM | POA: Diagnosis not present

## 2022-12-07 DIAGNOSIS — K6389 Other specified diseases of intestine: Secondary | ICD-10-CM | POA: Diagnosis not present

## 2022-12-07 DIAGNOSIS — K5730 Diverticulosis of large intestine without perforation or abscess without bleeding: Secondary | ICD-10-CM | POA: Diagnosis not present

## 2022-12-07 DIAGNOSIS — Z8249 Family history of ischemic heart disease and other diseases of the circulatory system: Secondary | ICD-10-CM | POA: Diagnosis not present

## 2022-12-07 DIAGNOSIS — K562 Volvulus: Secondary | ICD-10-CM

## 2022-12-07 DIAGNOSIS — Z961 Presence of intraocular lens: Secondary | ICD-10-CM | POA: Diagnosis present

## 2022-12-07 DIAGNOSIS — E0500 Thyrotoxicosis with diffuse goiter without thyrotoxic crisis or storm: Secondary | ICD-10-CM | POA: Diagnosis present

## 2022-12-07 DIAGNOSIS — Z681 Body mass index (BMI) 19 or less, adult: Secondary | ICD-10-CM

## 2022-12-07 DIAGNOSIS — I2510 Atherosclerotic heart disease of native coronary artery without angina pectoris: Secondary | ICD-10-CM | POA: Diagnosis present

## 2022-12-07 DIAGNOSIS — E079 Disorder of thyroid, unspecified: Secondary | ICD-10-CM | POA: Diagnosis present

## 2022-12-07 DIAGNOSIS — K5669 Other partial intestinal obstruction: Secondary | ICD-10-CM | POA: Diagnosis not present

## 2022-12-07 DIAGNOSIS — Z9841 Cataract extraction status, right eye: Secondary | ICD-10-CM

## 2022-12-07 DIAGNOSIS — K529 Noninfective gastroenteritis and colitis, unspecified: Secondary | ICD-10-CM | POA: Diagnosis not present

## 2022-12-07 DIAGNOSIS — Z4659 Encounter for fitting and adjustment of other gastrointestinal appliance and device: Secondary | ICD-10-CM | POA: Diagnosis not present

## 2022-12-07 DIAGNOSIS — Z9842 Cataract extraction status, left eye: Secondary | ICD-10-CM

## 2022-12-07 HISTORY — PX: LAPAROTOMY: SHX154

## 2022-12-07 LAB — CBC WITH DIFFERENTIAL/PLATELET
Abs Immature Granulocytes: 0.12 10*3/uL — ABNORMAL HIGH (ref 0.00–0.07)
Basophils Absolute: 0.1 10*3/uL (ref 0.0–0.1)
Basophils Relative: 0 %
Eosinophils Absolute: 0 10*3/uL (ref 0.0–0.5)
Eosinophils Relative: 0 %
HCT: 47 % — ABNORMAL HIGH (ref 36.0–46.0)
Hemoglobin: 16.2 g/dL — ABNORMAL HIGH (ref 12.0–15.0)
Immature Granulocytes: 1 %
Lymphocytes Relative: 8 %
Lymphs Abs: 1.4 10*3/uL (ref 0.7–4.0)
MCH: 33.5 pg (ref 26.0–34.0)
MCHC: 34.5 g/dL (ref 30.0–36.0)
MCV: 97.1 fL (ref 80.0–100.0)
Monocytes Absolute: 1.2 10*3/uL — ABNORMAL HIGH (ref 0.1–1.0)
Monocytes Relative: 7 %
Neutro Abs: 14.6 10*3/uL — ABNORMAL HIGH (ref 1.7–7.7)
Neutrophils Relative %: 84 %
Platelets: 291 10*3/uL (ref 150–400)
RBC: 4.84 MIL/uL (ref 3.87–5.11)
RDW: 12.9 % (ref 11.5–15.5)
WBC: 17.4 10*3/uL — ABNORMAL HIGH (ref 4.0–10.5)
nRBC: 0 % (ref 0.0–0.2)

## 2022-12-07 LAB — CBC
HCT: 39.5 % (ref 36.0–46.0)
Hemoglobin: 13.1 g/dL (ref 12.0–15.0)
MCH: 32.9 pg (ref 26.0–34.0)
MCHC: 33.2 g/dL (ref 30.0–36.0)
MCV: 99.2 fL (ref 80.0–100.0)
Platelets: 258 10*3/uL (ref 150–400)
RBC: 3.98 MIL/uL (ref 3.87–5.11)
RDW: 12.9 % (ref 11.5–15.5)
WBC: 11.8 10*3/uL — ABNORMAL HIGH (ref 4.0–10.5)
nRBC: 0 % (ref 0.0–0.2)

## 2022-12-07 LAB — COMPREHENSIVE METABOLIC PANEL
ALT: 16 U/L (ref 0–44)
AST: 23 U/L (ref 15–41)
Albumin: 5.1 g/dL — ABNORMAL HIGH (ref 3.5–5.0)
Alkaline Phosphatase: 61 U/L (ref 38–126)
Anion gap: 16 — ABNORMAL HIGH (ref 5–15)
BUN: 22 mg/dL (ref 8–23)
CO2: 24 mmol/L (ref 22–32)
Calcium: 10.8 mg/dL — ABNORMAL HIGH (ref 8.9–10.3)
Chloride: 92 mmol/L — ABNORMAL LOW (ref 98–111)
Creatinine, Ser: 0.74 mg/dL (ref 0.44–1.00)
GFR, Estimated: >60 mL/min (ref >60)
Glucose, Bld: 153 mg/dL — ABNORMAL HIGH (ref 70–99)
Potassium: 4 mmol/L (ref 3.5–5.1)
Sodium: 132 mmol/L — ABNORMAL LOW (ref 135–145)
Total Bilirubin: 1.1 mg/dL (ref 0.3–1.2)
Total Protein: 8.2 g/dL — ABNORMAL HIGH (ref 6.5–8.1)

## 2022-12-07 LAB — SURGICAL PCR SCREEN
MRSA, PCR: NEGATIVE
Staphylococcus aureus: NEGATIVE

## 2022-12-07 LAB — CREATININE, SERUM
Creatinine, Ser: 0.56 mg/dL (ref 0.44–1.00)
GFR, Estimated: >60 mL/min (ref >60)

## 2022-12-07 LAB — LIPASE, BLOOD: Lipase: 50 U/L (ref 11–51)

## 2022-12-07 SURGERY — LAPAROTOMY, EXPLORATORY
Anesthesia: General | Site: Abdomen

## 2022-12-07 MED ORDER — HYDROMORPHONE HCL 1 MG/ML IJ SOLN
INTRAMUSCULAR | Status: AC
  Filled 2022-12-07: qty 1

## 2022-12-07 MED ORDER — ROCURONIUM BROMIDE 10 MG/ML (PF) SYRINGE
PREFILLED_SYRINGE | INTRAVENOUS | Status: DC | PRN
  Administered 2022-12-07: 40 mg via INTRAVENOUS

## 2022-12-07 MED ORDER — PHENYLEPHRINE 80 MCG/ML (10ML) SYRINGE FOR IV PUSH (FOR BLOOD PRESSURE SUPPORT)
PREFILLED_SYRINGE | INTRAVENOUS | Status: DC | PRN
  Administered 2022-12-07: 80 ug via INTRAVENOUS

## 2022-12-07 MED ORDER — METHIMAZOLE 5 MG PO TABS
5.0000 mg | ORAL_TABLET | ORAL | Status: DC
  Filled 2022-12-07: qty 1

## 2022-12-07 MED ORDER — ONDANSETRON HCL 4 MG/2ML IJ SOLN
4.0000 mg | Freq: Once | INTRAMUSCULAR | Status: AC
  Administered 2022-12-07: 4 mg via INTRAVENOUS
  Filled 2022-12-07: qty 2

## 2022-12-07 MED ORDER — ORAL CARE MOUTH RINSE: 15.0000 mL | Freq: Once | OROMUCOSAL | Status: AC

## 2022-12-07 MED ORDER — METOPROLOL TARTRATE 5 MG/5ML IV SOLN: 5.0000 mg | Freq: Once | INTRAVENOUS | Status: AC

## 2022-12-07 MED ORDER — PROCHLORPERAZINE EDISYLATE 10 MG/2ML IJ SOLN
10.0000 mg | INTRAMUSCULAR | Status: DC | PRN
  Administered 2022-12-08 – 2022-12-09 (×2): 10 mg via INTRAVENOUS
  Filled 2022-12-07 (×2): qty 2

## 2022-12-07 MED ORDER — CHLORHEXIDINE GLUCONATE 0.12 % MT SOLN
OROMUCOSAL | Status: AC
  Administered 2022-12-07: 15 mL via OROMUCOSAL
  Filled 2022-12-07: qty 15

## 2022-12-07 MED ORDER — ONDANSETRON HCL 4 MG/2ML IJ SOLN
4.0000 mg | Freq: Four times a day (QID) | INTRAMUSCULAR | Status: DC | PRN
  Administered 2022-12-07 – 2022-12-12 (×8): 4 mg via INTRAVENOUS
  Filled 2022-12-07 (×8): qty 2

## 2022-12-07 MED ORDER — LACTATED RINGERS IV SOLN: INTRAVENOUS | Status: AC

## 2022-12-07 MED ORDER — METOPROLOL TARTRATE 25 MG PO TABS
25.0000 mg | ORAL_TABLET | Freq: Two times a day (BID) | ORAL | Status: DC
  Administered 2022-12-07: 25 mg
  Filled 2022-12-07 (×2): qty 1

## 2022-12-07 MED ORDER — MORPHINE SULFATE (PF) 4 MG/ML IV SOLN
4.0000 mg | Freq: Once | INTRAVENOUS | Status: AC
  Administered 2022-12-07: 4 mg via INTRAVENOUS
  Filled 2022-12-07: qty 1

## 2022-12-07 MED ORDER — SUGAMMADEX SODIUM 200 MG/2ML IV SOLN
INTRAVENOUS | Status: DC | PRN
  Administered 2022-12-07: 150 mg via INTRAVENOUS

## 2022-12-07 MED ORDER — ACETAMINOPHEN 325 MG PO TABS
650.0000 mg | ORAL_TABLET | Freq: Four times a day (QID) | ORAL | Status: DC
  Administered 2022-12-07 – 2022-12-08 (×4): 650 mg
  Filled 2022-12-07 (×4): qty 2

## 2022-12-07 MED ORDER — CHLORHEXIDINE GLUCONATE 0.12 % MT SOLN: 15.0000 mL | Freq: Once | OROMUCOSAL | Status: AC

## 2022-12-07 MED ORDER — HYDROMORPHONE HCL 1 MG/ML IJ SOLN: 0.5000 mg | INTRAMUSCULAR | Status: DC | PRN

## 2022-12-07 MED ORDER — PRAVASTATIN SODIUM 40 MG PO TABS
40.0000 mg | ORAL_TABLET | Freq: Every day | ORAL | Status: DC
  Administered 2022-12-07: 40 mg
  Filled 2022-12-07: qty 1

## 2022-12-07 MED ORDER — METOPROLOL TARTRATE 5 MG/5ML IV SOLN
INTRAVENOUS | Status: AC
  Administered 2022-12-07: 5 mg via INTRAVENOUS
  Filled 2022-12-07: qty 5

## 2022-12-07 MED ORDER — ONDANSETRON HCL 4 MG/2ML IJ SOLN
INTRAMUSCULAR | Status: DC | PRN
  Administered 2022-12-07: 4 mg via INTRAVENOUS

## 2022-12-07 MED ORDER — PROPOFOL 10 MG/ML IV BOLUS
INTRAVENOUS | Status: DC | PRN
  Administered 2022-12-07: 90 mg via INTRAVENOUS

## 2022-12-07 MED ORDER — METHOCARBAMOL 1000 MG/10ML IJ SOLN: 500.0000 mg | Freq: Four times a day (QID) | INTRAVENOUS | Status: DC | PRN

## 2022-12-07 MED ORDER — LIDOCAINE 2% (20 MG/ML) 5 ML SYRINGE
INTRAMUSCULAR | Status: DC | PRN
  Administered 2022-12-07: 30 mg via INTRAVENOUS

## 2022-12-07 MED ORDER — MIDAZOLAM HCL 2 MG/2ML IJ SOLN: 1.0000 mg | Freq: Once | INTRAMUSCULAR | Status: DC

## 2022-12-07 MED ORDER — LACTATED RINGERS IV SOLN: INTRAVENOUS | Status: DC

## 2022-12-07 MED ORDER — FENTANYL CITRATE (PF) 250 MCG/5ML IJ SOLN
INTRAMUSCULAR | Status: AC
  Filled 2022-12-07: qty 5

## 2022-12-07 MED ORDER — FENTANYL CITRATE (PF) 100 MCG/2ML IJ SOLN
INTRAMUSCULAR | Status: AC
  Filled 2022-12-07: qty 2

## 2022-12-07 MED ORDER — FENTANYL CITRATE (PF) 100 MCG/2ML IJ SOLN
25.0000 ug | INTRAMUSCULAR | Status: DC | PRN
  Administered 2022-12-07 (×2): 50 ug via INTRAVENOUS

## 2022-12-07 MED ORDER — CEFAZOLIN SODIUM-DEXTROSE 2-4 GM/100ML-% IV SOLN
2.0000 g | INTRAVENOUS | Status: AC
  Administered 2022-12-07: 2 g via INTRAVENOUS

## 2022-12-07 MED ORDER — SODIUM CHLORIDE 0.9 % IV BOLUS
1000.0000 mL | Freq: Once | INTRAVENOUS | Status: AC
  Administered 2022-12-07: 1000 mL via INTRAVENOUS

## 2022-12-07 MED ORDER — ENOXAPARIN SODIUM 30 MG/0.3ML IJ SOSY
30.0000 mg | PREFILLED_SYRINGE | Freq: Every day | INTRAMUSCULAR | Status: DC
  Administered 2022-12-08 – 2022-12-18 (×11): 30 mg via SUBCUTANEOUS
  Filled 2022-12-07 (×11): qty 0.3

## 2022-12-07 MED ORDER — FENTANYL CITRATE (PF) 250 MCG/5ML IJ SOLN
INTRAMUSCULAR | Status: DC | PRN
  Administered 2022-12-07 (×4): 25 ug via INTRAVENOUS
  Administered 2022-12-07 (×3): 50 ug via INTRAVENOUS

## 2022-12-07 MED ORDER — IOHEXOL 300 MG/ML  SOLN
100.0000 mL | Freq: Once | INTRAMUSCULAR | Status: AC | PRN
  Administered 2022-12-07: 100 mL via INTRAVENOUS

## 2022-12-07 MED ORDER — ASPIRIN 81 MG PO CHEW
81.0000 mg | CHEWABLE_TABLET | Freq: Every day | ORAL | Status: DC
  Administered 2022-12-07: 81 mg
  Filled 2022-12-07 (×2): qty 1

## 2022-12-07 MED ORDER — DEXAMETHASONE SODIUM PHOSPHATE 10 MG/ML IJ SOLN
INTRAMUSCULAR | Status: DC | PRN
  Administered 2022-12-07: 10 mg via INTRAVENOUS

## 2022-12-07 MED ORDER — 0.9 % SODIUM CHLORIDE (POUR BTL) OPTIME
TOPICAL | Status: DC | PRN
  Administered 2022-12-07: 1000 mL

## 2022-12-07 MED ORDER — HYDROMORPHONE HCL 1 MG/ML IJ SOLN
0.5000 mg | INTRAMUSCULAR | Status: DC | PRN
  Administered 2022-12-07 (×3): 0.5 mg via INTRAVENOUS

## 2022-12-07 SURGICAL SUPPLY — 49 items
APL PRP STRL LF DISP 70% ISPRP (MISCELLANEOUS) ×1
BLADE CLIPPER SURG (BLADE) IMPLANT
CANISTER SUCT 3000ML PPV (MISCELLANEOUS) ×2 IMPLANT
CHLORAPREP W/TINT 26 (MISCELLANEOUS) ×2 IMPLANT
COVER SURGICAL LIGHT HANDLE (MISCELLANEOUS) ×2 IMPLANT
DRAPE LAPAROSCOPIC ABDOMINAL (DRAPES) ×2 IMPLANT
DRAPE WARM FLUID 44X44 (DRAPES) ×2 IMPLANT
DRSG OPSITE POSTOP 4X10 (GAUZE/BANDAGES/DRESSINGS) IMPLANT
DRSG OPSITE POSTOP 4X8 (GAUZE/BANDAGES/DRESSINGS) IMPLANT
ELECT BLADE 6.5 EXT (BLADE) IMPLANT
ELECT CAUTERY BLADE 6.4 (BLADE) ×2 IMPLANT
ELECT REM PT RETURN 9FT ADLT (ELECTROSURGICAL) ×1
ELECTRODE REM PT RTRN 9FT ADLT (ELECTROSURGICAL) ×2 IMPLANT
GAUZE PAD ABD 8X10 STRL (GAUZE/BANDAGES/DRESSINGS) IMPLANT
GAUZE SPONGE 4X4 12PLY STRL (GAUZE/BANDAGES/DRESSINGS) IMPLANT
GLOVE BIO SURGEON STRL SZ7.5 (GLOVE) ×2 IMPLANT
GLOVE BIOGEL PI IND STRL 8 (GLOVE) ×2 IMPLANT
GOWN STRL REUS W/ TWL LRG LVL3 (GOWN DISPOSABLE) ×2 IMPLANT
GOWN STRL REUS W/ TWL XL LVL3 (GOWN DISPOSABLE) ×2 IMPLANT
GOWN STRL REUS W/TWL LRG LVL3 (GOWN DISPOSABLE) ×1
GOWN STRL REUS W/TWL XL LVL3 (GOWN DISPOSABLE) ×1
HANDLE SUCTION POOLE (INSTRUMENTS) ×2 IMPLANT
KIT BASIN OR (CUSTOM PROCEDURE TRAY) ×2 IMPLANT
KIT TURNOVER KIT B (KITS) ×2 IMPLANT
LIGASURE IMPACT 36 18CM CVD LR (INSTRUMENTS) IMPLANT
NS IRRIG 1000ML POUR BTL (IV SOLUTION) ×4 IMPLANT
PACK GENERAL/GYN (CUSTOM PROCEDURE TRAY) ×2 IMPLANT
PAD ABD 8X10 STRL (GAUZE/BANDAGES/DRESSINGS) IMPLANT
PAD ARMBOARD 7.5X6 YLW CONV (MISCELLANEOUS) ×2 IMPLANT
PENCIL SMOKE EVACUATOR (MISCELLANEOUS) ×2 IMPLANT
RELOAD STAPLE 60 2.6 WHT THN (STAPLE) IMPLANT
RELOAD STAPLER WHITE 60MM (STAPLE) ×4 IMPLANT
SPECIMEN JAR LARGE (MISCELLANEOUS) IMPLANT
SPONGE T-LAP 18X18 ~~LOC~~+RFID (SPONGE) IMPLANT
STAPLE ECHEON FLEX 60 POW ENDO (STAPLE) IMPLANT
STAPLER RELOAD WHITE 60MM (STAPLE) ×4
STAPLER VISISTAT 35W (STAPLE) ×2 IMPLANT
SUCTION POOLE HANDLE (INSTRUMENTS) ×1
SUT PDS AB 0 CT1 27 (SUTURE) IMPLANT
SUT PDS AB 1 TP1 54 (SUTURE) ×4 IMPLANT
SUT SILK 2 0 SH CR/8 (SUTURE) ×2 IMPLANT
SUT SILK 2 0 TIES 10X30 (SUTURE) ×2 IMPLANT
SUT SILK 3 0 SH CR/8 (SUTURE) ×2 IMPLANT
SUT SILK 3 0 TIES 10X30 (SUTURE) ×2 IMPLANT
SUT VIC AB 2-0 SH 18 (SUTURE) IMPLANT
TAPE CLOTH SURG 4X10 WHT LF (GAUZE/BANDAGES/DRESSINGS) IMPLANT
TOWEL GREEN STERILE (TOWEL DISPOSABLE) ×2 IMPLANT
TRAY FOLEY MTR SLVR 16FR STAT (SET/KITS/TRAYS/PACK) ×2 IMPLANT
YANKAUER SUCT BULB TIP NO VENT (SUCTIONS) IMPLANT

## 2022-12-07 NOTE — Anesthesia Postprocedure Evaluation (Signed)
Anesthesia Post Note  Patient: Deborah [name]. [place]  Procedure(s) Performed: OPEN RIGHT COLECTOMY (Abdomen)     Patient location during evaluation: PACU Anesthesia Type: General Level of consciousness: awake and alert Pain management: pain level controlled Vital Signs Assessment: post-procedure vital signs reviewed and stable Respiratory status: spontaneous breathing, nonlabored ventilation, respiratory function stable and patient connected to nasal cannula oxygen Cardiovascular status: blood pressure returned to baseline and stable Postop Assessment: no apparent nausea or vomiting Anesthetic complications: no   No notable events documented.  Last Vitals:  Vitals:   12/07/22 1515 12/07/22 1600  BP: (!) 154/76 138/64  Pulse: 86 92  Resp: (!) 1 10  Temp: 36.9 C 36.5 C  SpO2: 95% 98%    Last Pain:  Vitals:   12/07/22 1600  TempSrc: Oral  PainSc:                  [name]

## 2022-12-07 NOTE — ED Notes (Signed)
ED TO INPATIENT HANDOFF REPORT  ED Nurse Name and Phone #: Louis Matte  S Name/Age/Gender Deborah [name] 77 y.o. female Room/Bed: 027C/027C  Code Status   Code Status: Full Code  Home/SNF/Other Assisted living Patient oriented to: self, place, time, and situation Is this baseline? Yes   Triage Complete: Triage complete  Chief Complaint Small bowel obstruction [K56.609]  Triage Note Reports diffuse abd pain and N/V. Last BM 12/06/22.   Pt arrived in stable condition AOX4, transfer from [facility], NG tube in place, to be evaluated for surgery.  139/70 HR 92   Allergies Allergies  Allergen Reactions   Atorvastatin Other (See Comments)    Muscle and joint aches   Simvastatin Other (See Comments)    Muscle and joint aches   Rosuvastatin Other (See Comments)    Muscle and joint aches    Level of Care/Admitting Diagnosis ED Disposition     ED Disposition  Admit   Condition  --   Comment  Hospital Area: [facility] [100100]  Level of Care: Med-Surg [16]  May admit patient to [name] or [facility] if equivalent level of care is available:: No  Covid Evaluation: Asymptomatic - no recent exposure (last 10 days) testing not required  Diagnosis: Small bowel obstruction [716967]  Admitting Physician: CCS, MD [[id number]]  Attending Physician: CCS, MD [[id number]]  Certification:: I certify this patient will need inpatient services for at least 2 midnights  Estimated Length of Stay: 7          B Medical/Surgery History Past Medical History:  Diagnosis Date   Arthritis    "qwhere" (02/26/2018)   Atrial septal aneurysm    Cancer (HCC)    "chest" (02/26/2018)   Coronary artery disease    Graves' disease    High cholesterol    Mild carotid artery disease (HCC)    Mild mitral regurgitation    Thyroid disease    Past Surgical History:  Procedure Laterality Date   ANTERIOR CRUCIATE LIGAMENT REPAIR Left    BASAL CELL CARCINOMA EXCISION     "chest"  (02/26/2018)   CARDIAC CATHETERIZATION     CARPAL TUNNEL RELEASE Right    CATARACT EXTRACTION W/ INTRAOCULAR LENS  IMPLANT, BILATERAL Bilateral    CORONARY ARTERY BYPASS GRAFT N/A 03/02/2018   Procedure: CORONARY ARTERY BYPASS GRAFTING (CABG);  Surgeon: Delight Ovens, MD;  Location: [facility] OR;  Service: Open Heart Surgery;  Laterality: N/A;  Times 3 using left internal mammary artery to LAD and endoscopically harvested left saphenous vein to Diagonal and PDA.   DILATION AND CURETTAGE OF UTERUS     LEFT HEART CATH AND CORONARY ANGIOGRAPHY N/A 02/26/2018   Procedure: LEFT HEART CATH AND CORONARY ANGIOGRAPHY;  Surgeon: Marykay Lex, MD;  Location: [facility] INVASIVE CV LAB;  Service: Cardiovascular;  Laterality: N/A;   RIGHT OOPHORECTOMY Right 1970s   SHOULDER ARTHROSCOPY W/ ROTATOR CUFF REPAIR Bilateral    TEE WITHOUT CARDIOVERSION N/A 03/02/2018   Procedure: TRANSESOPHAGEAL ECHOCARDIOGRAM (TEE);  Surgeon: Delight Ovens, MD;  Location: [facility] OR;  Service: Open Heart Surgery;  Laterality: N/A;   TONSILLECTOMY     TRIGGER FINGER RELEASE Bilateral    "8 of them"     A IV Location/Drains/Wounds Patient Lines/Drains/Airways Status     Active Line/Drains/Airways     Name Placement date Placement time Site Days   Peripheral IV 12/07/22 20 G 1" Anterior;Left Forearm 12/07/22  0338  Forearm  less than 1   NG/OG Vented/Dual  Lumen 16 Fr. Right nare Marking at nare/corner of mouth 55 cm 12/07/22  0632  Right nare  less than 1            Intake/Output Last 24 hours  Intake/Output Summary (Last 24 hours) at 12/07/2022 0906 Last data filed at 12/07/2022 4098 Gross per 24 hour  Intake 999 ml  Output 100 ml  Net 899 ml    Labs/Imaging Results for orders placed or performed during the hospital encounter of 12/07/22 (from the past 48 hour(s))  Comprehensive metabolic panel     Status: Abnormal   Collection Time: 12/07/22  3:40 AM  Result Value Ref Range   Sodium 132 (L) 135 - 145 mmol/L    Potassium 4.0 3.5 - 5.1 mmol/L   Chloride 92 (L) 98 - 111 mmol/L   CO2 24 22 - 32 mmol/L   Glucose, Bld 153 (H) 70 - 99 mg/dL    Comment: Glucose reference range applies only to samples taken after fasting for at least 8 hours.   BUN 22 8 - 23 mg/dL   Creatinine, Ser 1.19 0.44 - 1.00 mg/dL   Calcium 14.7 (H) 8.9 - 10.3 mg/dL   Total Protein 8.2 (H) 6.5 - 8.1 g/dL   Albumin 5.1 (H) 3.5 - 5.0 g/dL   AST 23 15 - 41 U/L   ALT 16 0 - 44 U/L   Alkaline Phosphatase 61 38 - 126 U/L   Total Bilirubin 1.1 0.3 - 1.2 mg/dL   GFR, Estimated >82 >95 mL/min    Comment: (NOTE) Calculated using the CKD-EPI Creatinine Equation (2021)    Anion gap 16 (H) 5 - 15    Comment: Performed at [facility], [address] [place], [place] [address]  Lipase, blood     Status: None   Collection Time: 12/07/22  3:40 AM  Result Value Ref Range   Lipase 50 11 - 51 U/L    Comment: Performed at [facility], [address] [place], [place] [address]  CBC with Differential     Status: Abnormal   Collection Time: 12/07/22  3:40 AM  Result Value Ref Range   WBC 17.4 (H) 4.0 - 10.5 K/uL   RBC 4.84 3.87 - 5.11 MIL/uL   Hemoglobin 16.2 (H) 12.0 - 15.0 g/dL   HCT 46.9 (H) 62.9 - 52.8 %   MCV 97.1 80.0 - 100.0 fL   MCH 33.5 26.0 - 34.0 pg   MCHC 34.5 30.0 - 36.0 g/dL   RDW 41.3 24.4 - 01.0 %   Platelets 291 150 - 400 K/uL   nRBC 0.0 0.0 - 0.2 %   Neutrophils Relative % 84 %   Neutro Abs 14.6 (H) 1.7 - 7.7 K/uL   Lymphocytes Relative 8 %   Lymphs Abs 1.4 0.7 - 4.0 K/uL   Monocytes Relative 7 %   Monocytes Absolute 1.2 (H) 0.1 - 1.0 K/uL   Eosinophils Relative 0 %   Eosinophils Absolute 0.0 0.0 - 0.5 K/uL   Basophils Relative 0 %   Basophils Absolute 0.1 0.0 - 0.1 K/uL   Immature Granulocytes 1 %   Abs Immature Granulocytes 0.12 (H) 0.00 - 0.07 K/uL    Comment: Performed at [facility], [address] [place], [place] [address]   DG Abd Portable  1 View  Result Date: 12/07/2022 CLINICAL DATA:  77 year old female NG tube placement. EXAM: PORTABLE ABDOMEN - 1 VIEW COMPARISON:  CT Abdomen and Pelvis 0456 hours today. FINDINGS: Portable  AP supine view at 0645 hours. The enteric tube courses to the level of the gastric hernia, gastroesophageal junction but then is folded back on itself, at the level of the diaphragm. Lung bases remain clear. Prior CABG. Stable visible bowel gas pattern, with excreted renal IV contrast now. Scoliosis. IMPRESSION: 1. Enteric tube is kinked at the level of the hiatal hernia. Recommend repositioning attempt with repeat KUB. 2. Otherwise stable from CT Abdomen and Pelvis 0456 hours today. Electronically Signed   By: Odessa Fleming M.D.   On: 12/07/2022 07:21   CT ABDOMEN PELVIS W CONTRAST  Result Date: 12/07/2022 CLINICAL DATA:  77 year old female with history of acute onset of nonlocalized abdominal pain. EXAM: CT ABDOMEN AND PELVIS WITH CONTRAST TECHNIQUE: Multidetector CT imaging of the abdomen and pelvis was performed using the standard protocol following bolus administration of intravenous contrast. RADIATION DOSE REDUCTION: This exam was performed according to the departmental dose-optimization program which includes automated exposure control, adjustment of the mA and/or kV according to patient size and/or use of iterative reconstruction technique. CONTRAST:  OMNIPAQUE IOHEXOL 300 MG/ML  SOLN COMPARISON:  CT of the chest, abdomen and pelvis 03/28/2018. FINDINGS: Lower chest: Atherosclerotic calcifications in the descending thoracic aorta as well as the left anterior descending, left circumflex and right coronary arteries. Status post median sternotomy. Moderate-sized hiatal hernia. Hepatobiliary: No suspicious cystic or solid hepatic lesions. No intra or extrahepatic biliary ductal dilatation. Gallbladder is unremarkable in appearance. Pancreas: No pancreatic mass. No pancreatic ductal dilatation. No pancreatic or  peripancreatic fluid collections or inflammatory changes. Spleen: Unremarkable. Adrenals/Urinary Tract: Multiple subcentimeter low-attenuation lesions in both kidneys, too small to characterize, but statistically likely to represent cysts (no imaging follow-up recommended). In addition, in the upper pole of the left kidney (coronal image 25 of series 5) there is a 1.2 cm intermediate attenuation (32 HU) lesion which may have some internal architecture, considered indeterminate. No hydroureteronephrosis. Urinary bladder is nearly completely decompressed, but otherwise unremarkable in appearance. Bilateral adrenal glands are normal in appearance. Stomach/Bowel: Stomach is only mildly distended with gas and fluid. Proximal small bowel (duodenum and proximal jejunum) appears relatively decompressed however, just distal to this there is a severely dilated segment of bowel in the left side of the abdomen measuring up to 8.1 cm in diameter, which presumably reflects a portion of the jejunum (axial image 44 of series 2) with a large air-fluid level within it. There is an acute waist like narrowing in the right-side of the abdomen at the distal aspect of this dilated loop of bowel (axial image 51 of series 2), beyond which there are multiple other borderline dilated mildly dilated loops of small bowel, with several air-fluid levels measuring up to 3.1 cm in diameter. The distal aspect of the ileum, and the entire colon otherwise appear completely decompressed. Numerous colonic diverticuli are noted, without definite focal surrounding inflammatory changes to suggest an acute diverticulitis at this time. The appendix is not confidently identified and may be surgically absent. Regardless, there are no inflammatory changes noted adjacent to the cecum to suggest the presence of an acute appendicitis at this time. Vascular/Lymphatic: Atherosclerosis throughout the abdominal aorta and pelvic vasculature, without evidence of  aneurysm or dissection. No lymphadenopathy noted in the abdomen or pelvis. Reproductive: Uterus and ovaries are atrophic, but otherwise unremarkable in appearance. Other: No significant volume of ascites.  No pneumoperitoneum. Musculoskeletal: Dextroscoliosis of the lumbar spine. There are no aggressive appearing lytic or blastic lesions noted in the visualized portions of  the skeleton. IMPRESSION: 1. Severe small-bowel obstruction, as above, the appearance of which suggests probable internal hernia. The largest dilated loop of small bowel measures up to 8.1 cm in diameter. Emergent surgical consultation is strongly recommended. 2. Colonic diverticulosis without evidence of acute diverticulitis at this time. 3. Small hiatal hernia. 4. Aortic atherosclerosis, in addition to three-vessel coronary artery disease. Assessment for potential risk factor modification, dietary therapy or pharmacologic therapy may be warranted, if clinically indicated. 5. Small indeterminate lesion in the upper pole of the left kidney measuring 1.2 cm in diameter. Follow-up outpatient nonemergent abdominal MRI with and without IV gadolinium is recommended in the near future after resolution of the patient's acute illness to characterize this lesion. 6. Additional incidental findings, as above. Critical Value/emergent results were called by telephone at the time of interpretation on 12/07/2022 at 6:07 am to provider Geoffery Lyons, who verbally acknowledged these results. Electronically Signed   By: Trudie Reed M.D.   On: 12/07/2022 06:09    Pending Labs Unresulted Labs (From admission, onward)     Start     Ordered   12/14/22 0500  Creatinine, serum  (enoxaparin (LOVENOX)    CrCl >/= 30 ml/min)  Weekly,   R     Comments: while on enoxaparin therapy    12/07/22 0859   12/08/22 0500  Basic metabolic panel  Daily,   R      12/07/22 0859   12/08/22 0500  CBC  Daily,   R      12/07/22 0859   12/07/22 0858  CBC  (enoxaparin (LOVENOX)     CrCl >/= 30 ml/min)  Once,   R       Comments: Baseline for enoxaparin therapy IF NOT ALREADY DRAWN.  Notify MD if PLT < 100 K.    12/07/22 0859   12/07/22 0858  Creatinine, serum  (enoxaparin (LOVENOX)    CrCl >/= 30 ml/min)  Once,   R       Comments: Baseline for enoxaparin therapy IF NOT ALREADY DRAWN.    12/07/22 0859            Vitals/Pain Today's Vitals   12/07/22 0615 12/07/22 0630 12/07/22 0645 12/07/22 0822  BP: (!) 141/72 (!) 149/90 (!) 162/91 (!) 147/86  Pulse: 90 (!) 101 99 93  Resp: 13 12 17 17   Temp:    98.6 F (37 C)  TempSrc:    Oral  SpO2: 96% 98% 99% 97%  Weight:      Height:      PainSc:        Isolation Precautions No active isolations  Medications Medications  midazolam (VERSED) injection 1 mg (has no administration in time range)  enoxaparin (LOVENOX) injection 30 mg (has no administration in time range)  lactated ringers infusion (has no administration in time range)  acetaminophen (TYLENOL) tablet 650 mg (has no administration in time range)  HYDROmorphone (DILAUDID) injection 0.5 mg (has no administration in time range)  methocarbamol (ROBAXIN) 500 mg in dextrose 5 % 50 mL IVPB (has no administration in time range)  ondansetron (ZOFRAN) injection 4 mg (has no administration in time range)  prochlorperazine (COMPAZINE) injection 10 mg (has no administration in time range)  ceFAZolin (ANCEF) IVPB 2g/100 mL premix (has no administration in time range)  sodium chloride 0.9 % bolus 1,000 mL (0 mLs Intravenous Stopped 12/07/22 0544)  ondansetron (ZOFRAN) injection 4 mg (4 mg Intravenous Given 12/07/22 0345)  morphine (PF) 4 MG/ML injection 4 mg (4  mg Intravenous Given 12/07/22 0346)  iohexol (OMNIPAQUE) 300 MG/ML solution 100 mL (100 mLs Intravenous Contrast Given 12/07/22 0450)  morphine (PF) 4 MG/ML injection 4 mg (4 mg Intravenous Given 12/07/22 [id number])    Mobility walks     Focused Assessments GI   R Recommendations: See Admitting Provider  Note  Report given to:   Additional Notes: PT was a transfer from [facility], is AOX4, lovely, normally walky but with the NG tube she hasn't been here, great historian, able to verbalize needs, surgeon saw her this morning down here but I did not hear a definite one way or the other on going to the OR.. so I'm not giving the Lovenox

## 2022-12-07 NOTE — ED Provider Notes (Signed)
[place] EMERGENCY DEPARTMENT AT [facility] Provider Note   CSN: [id number] Arrival date & time: 12/07/22  8875     History  Chief Complaint  Patient presents with   Abdominal Pain    Deborah [name] is a 77 y.o. female.  Patient is a 77 year old female with past medical history of coronary artery disease with CABG, hyperlipidemia, hypertension.  Patient presenting today with complaints of abdominal pain.  She reports a 2-day history of worsening, generalized pain.  She began vomiting earlier this evening.  She reports only a small bowel movement yesterday.  She denies any bloody stool or vomit.  She denies any urinary complaints.  No fevers or chills.  Pain is worse with palpation and when she attempts to eat.  The history is provided by the patient.       Home Medications Prior to Admission medications   Medication Sig Start Date End Date Taking? Authorizing Provider  aspirin 81 MG tablet Take 81 mg by mouth daily.    [provider]  Calcium Carb-Cholecalciferol (CALCIUM+D3 PO) Take 1 tablet by mouth 2 (two) times daily after a meal.    [provider]  Cholecalciferol (VITAMIN D-3) 1000 units CAPS Take 1,000 Units by mouth at bedtime.    [provider]  methimazole (TAPAZOLE) 5 MG tablet Take 5 mg by mouth 4 (four) times a week.    [provider]  metoprolol tartrate (LOPRESSOR) 25 MG tablet Take 1 tablet (25 mg total) by mouth 2 (two) times daily. 09/24/22   Dunn, Tacey Ruiz, PA-C  Multiple Vitamins-Calcium (ONE-A-DAY WOMENS FORMULA) TABS Take 1 tablet by mouth daily.    [provider]  PRALUENT 75 MG/ML SOAJ Inject 1 Syringe into the skin every 14 (fourteen) days. 07/06/20   [provider]  pravastatin (PRAVACHOL) 40 MG tablet Take 40 mg by mouth at bedtime. 11/09/11   [provider]      Allergies    Atorvastatin, Simvastatin, and Rosuvastatin    Review of Systems   Review of Systems  All other  systems reviewed and are negative.   Physical Exam Updated Vital Signs BP (!) 169/101   Pulse (!) 124   Temp 97.9 F (36.6 C) (Oral)   Resp 18   Ht 5\' 3"  (1.6 m)   Wt 45.4 kg   SpO2 100%   BMI 17.71 kg/m  Physical Exam Vitals and nursing note reviewed.  Constitutional:      General: She is not in acute distress.    Appearance: She is well-developed. She is not diaphoretic.  HENT:     Head: Normocephalic and atraumatic.  Cardiovascular:     Rate and Rhythm: Normal rate and regular rhythm.     Heart sounds: No murmur heard.    No friction rub. No gallop.  Pulmonary:     Effort: Pulmonary effort is normal. No respiratory distress.     Breath sounds: Normal breath sounds. No wheezing.  Abdominal:     General: Bowel sounds are normal. There is no distension.     Palpations: Abdomen is soft.     Tenderness: There is generalized abdominal tenderness. There is no right CVA tenderness, left CVA tenderness, guarding or rebound.     Comments: There is generalized abdominal tenderness.  There is some tympany noted to percussion.  Musculoskeletal:        General: Normal range of motion.     Cervical back: Normal range of motion and neck supple.  Skin:    General: Skin is warm and dry.  Neurological:     General: No focal deficit present.     Mental Status: She is alert and oriented to person, place, and time.     ED Results / Procedures / Treatments   Labs (all labs ordered are listed, but only abnormal results are displayed) Labs Reviewed - No data to display  EKG None  Radiology No results found.  Procedures Procedures    Medications Ordered in ED Medications  sodium chloride 0.9 % bolus 1,000 mL (has no administration in time range)  ondansetron (ZOFRAN) injection 4 mg (has no administration in time range)  morphine (PF) 4 MG/ML injection 4 mg (has no administration in time range)    ED Course/ Medical Decision Making/ A&P  Patient is a 77 year old female  presenting with a 2-day history of worsening abdominal pain and episodes of vomiting.  She arrives here with stable vital signs, but a diffusely tender abdomen that is tympanitic to palpation.  Workup initiated including CBC, metabolic panel, and lipase.  Patient has a white count of 17,000, but laboratory studies otherwise unremarkable.  CT scan of the abdomen and pelvis obtained showing a severe small bowel obstruction the appearance of which suggest a probable internal hernia.  There is one segment of bowel that measures up to 8 cm in diameter.  Radiology has suggested emergent surgical consultation.  While in the ER, patient has received morphine for pain and Zofran for nausea along with IV fluids.  I discussed the above CT findings with Dr. Janee Morn from general surgery.  He is recommending patient being an ER to ER transfer so that surgical consultation can happen in an expedited fashion.  We are in the process of inserting an NG tube and also making arrangements for transfer to [name] for this consultation.  I have spoken with Dr. Pilar Plate in the ER at [facility] who agrees to accept the patient in transfer.  CRITICAL CARE Performed by: Geoffery Lyons Total critical care time: 40 minutes Critical care time was exclusive of separately billable procedures and treating other patients. Critical care was necessary to treat or prevent imminent or life-threatening deterioration. Critical care was time spent personally by me on the following activities: development of treatment plan with patient and/or surrogate as well as nursing, discussions with consultants, evaluation of patient's response to treatment, examination of patient, obtaining history from patient or surrogate, ordering and performing treatments and interventions, ordering and review of laboratory studies, ordering and review of radiographic studies, pulse oximetry and re-evaluation of patient's condition.   Final Clinical Impression(s)  / ED Diagnoses Final diagnoses:  None    Rx / DC Orders ED Discharge Orders     None         Geoffery Lyons, MD 12/07/22 [phone]

## 2022-12-07 NOTE — ED Triage Notes (Signed)
Pt arrived in stable condition AOX4, transfer from [facility], NG tube in place, to be evaluated for surgery.  139/70 HR 92

## 2022-12-07 NOTE — ED Triage Notes (Signed)
Reports diffuse abd pain and N/V. Last BM 12/06/22.

## 2022-12-07 NOTE — ED Notes (Signed)
Patient leaving the floor in stable condition, AOX4, with staff, and her belongings.  Courtesy call to [id number] approximately 15 minutes ago to ensure they were aware the patient would be arriving soon.

## 2022-12-07 NOTE — ED Notes (Signed)
Janan Halter RN charge nurse given report of NG reposition and need for confirmation [email or web address] to transport

## 2022-12-07 NOTE — Anesthesia Preprocedure Evaluation (Signed)
Anesthesia Evaluation  Patient identified by MRN, date of birth, ID band Patient awake    Reviewed: Allergy & Precautions, NPO status , Patient's Chart, lab work & pertinent test results  Airway Mallampati: II  TM Distance: >3 FB Neck ROM: Full    Dental no notable dental hx.    Pulmonary neg pulmonary ROS, former smoker   Pulmonary exam normal        Cardiovascular hypertension, Pt. on medications and Pt. on home beta blockers + CAD, + Past MI and + CABG (2019)   Rhythm:Regular Rate:Normal     1. Left ventricular ejection fraction, by estimation, is 60 to 65%. The  left ventricle has normal function. The left ventricle has no regional  wall motion abnormalities. Left ventricular diastolic parameters were  normal.   2. Right ventricular systolic function is low normal. The right  ventricular size is normal. There is normal pulmonary artery systolic  pressure. The estimated right ventricular systolic pressure is 25.3 mmHg.   3. The mitral valve is abnormal. Mild to moderate mitral valve  regurgitation.   4. Leaflet tips are sclerotic. The aortic valve is tricuspid. Aortic  valve regurgitation is mild. No aortic stenosis is present. Aortic  regurgitation PHT measures 424 msec.   5. Aortic dilatation noted. There is borderline dilatation of the  ascending aorta, measuring 39 mm.   6. The inferior vena cava is dilated in size with >50% respiratory  variability, suggesting right atrial pressure of 8 mmHg.   Comparison(s): Changes from prior study are noted. 02/27/2018: LVEF 60-65%,  trivial AI.     Neuro/Psych negative neurological ROS  negative psych ROS   GI/Hepatic Neg liver ROS,,,SBO   Endo/Other  negative endocrine ROS    Renal/GU negative Renal ROS  negative genitourinary   Musculoskeletal  (+) Arthritis , Osteoarthritis,    Abdominal Normal abdominal exam  (+)   Peds  Hematology negative hematology  ROS (+)   Anesthesia Other Findings   Reproductive/Obstetrics                             Anesthesia Physical Anesthesia Plan  ASA: 3  Anesthesia Plan: General   Post-op Pain Management:    Induction: Intravenous and Rapid sequence  PONV Risk Score and Plan: 3 and Ondansetron, Dexamethasone and Treatment may vary due to age or medical condition  Airway Management Planned: Mask and Oral ETT  Additional Equipment: None  Intra-op Plan:   Post-operative Plan: Extubation in OR  Informed Consent: I have reviewed the patients History and Physical, chart, labs and discussed the procedure including the risks, benefits and alternatives for the proposed anesthesia with the patient or authorized representative who has indicated his/her understanding and acceptance.     Dental advisory given  Plan Discussed with: CRNA  Anesthesia Plan Comments:        Anesthesia Quick Evaluation

## 2022-12-07 NOTE — H&P (Signed)
Admitting Physician: Hyman Hopes Sya Nestler  Service: General Surgery  CC: Abdominal pain  Subjective   HPI: Deborah [name] is an 77 y.o. female who is here for abdominal pain.  Her pain started on Thursday.  It comes and goes in waves.  It has gotten progressively worse over the last few days.  She started having severe vomiting and worse pain yesterday so she presented to the [facility] emergency department.  Her only abdominal surgery was years ago in her 65s when she had a Pfannenstiel incision for a cyst removal from her right ovary.  In 2019 she had a coronary artery bypass graft and does have a hernia in her epigastric region from her drain site.  Past Medical History:  Diagnosis Date   Arthritis    "qwhere" (02/26/2018)   Atrial septal aneurysm    Cancer (HCC)    "chest" (02/26/2018)   Coronary artery disease    Graves' disease    High cholesterol    Mild carotid artery disease (HCC)    Mild mitral regurgitation    Thyroid disease     Past Surgical History:  Procedure Laterality Date   ANTERIOR CRUCIATE LIGAMENT REPAIR Left    BASAL CELL CARCINOMA EXCISION     "chest" (02/26/2018)   CARDIAC CATHETERIZATION     CARPAL TUNNEL RELEASE Right    CATARACT EXTRACTION W/ INTRAOCULAR LENS  IMPLANT, BILATERAL Bilateral    CORONARY ARTERY BYPASS GRAFT N/A 03/02/2018   Procedure: CORONARY ARTERY BYPASS GRAFTING (CABG);  Surgeon: Delight Ovens, MD;  Location: [facility] OR;  Service: Open Heart Surgery;  Laterality: N/A;  Times 3 using left internal mammary artery to LAD and endoscopically harvested left saphenous vein to Diagonal and PDA.   DILATION AND CURETTAGE OF UTERUS     LEFT HEART CATH AND CORONARY ANGIOGRAPHY N/A 02/26/2018   Procedure: LEFT HEART CATH AND CORONARY ANGIOGRAPHY;  Surgeon: Marykay Lex, MD;  Location: [facility] INVASIVE CV LAB;  Service: Cardiovascular;  Laterality: N/A;   RIGHT OOPHORECTOMY Right 1970s   SHOULDER ARTHROSCOPY W/ ROTATOR CUFF REPAIR Bilateral     TEE WITHOUT CARDIOVERSION N/A 03/02/2018   Procedure: TRANSESOPHAGEAL ECHOCARDIOGRAM (TEE);  Surgeon: Delight Ovens, MD;  Location: [facility] OR;  Service: Open Heart Surgery;  Laterality: N/A;   TONSILLECTOMY     TRIGGER FINGER RELEASE Bilateral    "8 of them"    Family History  Problem Relation Age of Onset   Osteoporosis Mother    CAD Father        1st Mi around age 23   Hypercholesterolemia Father    Hypercholesterolemia Sister    Multiple myeloma Brother     Social:  reports that she has quit smoking. Her smoking use included cigarettes. She has a 15.00 pack-year smoking history. She has never used smokeless tobacco. She reports current alcohol use of about 6.0 standard drinks of alcohol per week. She reports that she does not use drugs.  Allergies:  Allergies  Allergen Reactions   Atorvastatin Other (See Comments)    Muscle and joint aches   Simvastatin Other (See Comments)    Muscle and joint aches   Rosuvastatin Other (See Comments)    Muscle and joint aches    Medications: Current Outpatient Medications  Medication Instructions   aspirin 81 mg, Oral, Daily   Calcium Carb-Cholecalciferol (CALCIUM+D3 PO) 1 tablet, Oral, 2 times daily after meals   methimazole (TAPAZOLE) 5 mg, Oral, 4 times weekly   metoprolol tartrate (LOPRESSOR) 25  mg, Oral, 2 times daily   Multiple Vitamins-Calcium (ONE-A-DAY WOMENS FORMULA) TABS 1 tablet, Oral, Daily   PRALUENT 75 MG/ML SOAJ 1 Syringe, Subcutaneous, Every 14 days   pravastatin (PRAVACHOL) 40 mg, Oral, Daily at bedtime   Vitamin D-3 1,000 Units, Oral, Daily at bedtime    ROS - all of the below systems have been reviewed with the patient and positives are indicated with bold text General: chills, fever or night sweats Eyes: blurry vision or double vision ENT: epistaxis or sore throat Allergy/Immunology: itchy/watery eyes or nasal congestion Hematologic/Lymphatic: bleeding problems, blood clots or swollen lymph nodes Endocrine:  temperature intolerance or unexpected weight changes Breast: new or changing breast lumps or nipple discharge Resp: cough, shortness of breath, or wheezing CV: chest pain or dyspnea on exertion GI: as per HPI GU: dysuria, trouble voiding, or hematuria MSK: joint pain or joint stiffness Neuro: TIA or stroke symptoms Derm: pruritus and skin lesion changes Psych: anxiety and depression  Objective   PE Blood pressure (!) 147/86, pulse 93, temperature 98.6 F (37 C), temperature source Oral, resp. rate 17, height 5\' 3"  (1.6 m), weight 45.4 kg, SpO2 97 %. Constitutional: NAD; conversant; no deformities Eyes: Moist conjunctiva; no lid lag; anicteric; PERRL Neck: Trachea midline; no thyromegaly Lungs: Normal respiratory effort; no tactile fremitus CV: RRR; no palpable thrills; no pitting edema GI: Abd Severe distention and abdominal tenderness MSK: Normal range of motion of extremities; no clubbing/cyanosis Psychiatric: Appropriate affect; alert and oriented x3 Lymphatic: No palpable cervical or axillary lymphadenopathy  Results for orders placed or performed during the hospital encounter of 12/07/22 (from the past 24 hour(s))  Comprehensive metabolic panel     Status: Abnormal   Collection Time: 12/07/22  3:40 AM  Result Value Ref Range   Sodium 132 (L) 135 - 145 mmol/L   Potassium 4.0 3.5 - 5.1 mmol/L   Chloride 92 (L) 98 - 111 mmol/L   CO2 24 22 - 32 mmol/L   Glucose, Bld 153 (H) 70 - 99 mg/dL   BUN 22 8 - 23 mg/dL   Creatinine, Ser 4.80 0.44 - 1.00 mg/dL   Calcium 16.5 (H) 8.9 - 10.3 mg/dL   Total Protein 8.2 (H) 6.5 - 8.1 g/dL   Albumin 5.1 (H) 3.5 - 5.0 g/dL   AST 23 15 - 41 U/L   ALT 16 0 - 44 U/L   Alkaline Phosphatase 61 38 - 126 U/L   Total Bilirubin 1.1 0.3 - 1.2 mg/dL   GFR, Estimated >53 >74 mL/min   Anion gap 16 (H) 5 - 15  Lipase, blood     Status: None   Collection Time: 12/07/22  3:40 AM  Result Value Ref Range   Lipase 50 11 - 51 U/L  CBC with Differential      Status: Abnormal   Collection Time: 12/07/22  3:40 AM  Result Value Ref Range   WBC 17.4 (H) 4.0 - 10.5 K/uL   RBC 4.84 3.87 - 5.11 MIL/uL   Hemoglobin 16.2 (H) 12.0 - 15.0 g/dL   HCT 82.7 (H) 07.8 - 67.5 %   MCV 97.1 80.0 - 100.0 fL   MCH 33.5 26.0 - 34.0 pg   MCHC 34.5 30.0 - 36.0 g/dL   RDW 44.9 20.1 - 00.7 %   Platelets 291 150 - 400 K/uL   nRBC 0.0 0.0 - 0.2 %   Neutrophils Relative % 84 %   Neutro Abs 14.6 (H) 1.7 - 7.7 K/uL   Lymphocytes Relative  8 %   Lymphs Abs 1.4 0.7 - 4.0 K/uL   Monocytes Relative 7 %   Monocytes Absolute 1.2 (H) 0.1 - 1.0 K/uL   Eosinophils Relative 0 %   Eosinophils Absolute 0.0 0.0 - 0.5 K/uL   Basophils Relative 0 %   Basophils Absolute 0.1 0.0 - 0.1 K/uL   Immature Granulocytes 1 %   Abs Immature Granulocytes 0.12 (H) 0.00 - 0.07 K/uL     Imaging Orders         CT ABDOMEN PELVIS W CONTRAST     1. Severe small-bowel obstruction, as above, the appearance of which suggests probable internal hernia. The largest dilated loop of small bowel measures up to 8.1 cm in diameter. Emergent surgical consultation is strongly recommended. 2. Colonic diverticulosis without evidence of acute diverticulitis at this time. 3. Small hiatal hernia. 4. Aortic atherosclerosis, in addition to three-vessel coronary artery disease. Assessment for potential risk factor modification, dietary therapy or pharmacologic therapy may be warranted, if clinically indicated. 5. Small indeterminate lesion in the upper pole of the left kidney measuring 1.2 cm in diameter. Follow-up outpatient nonemergent abdominal MRI with and without IV gadolinium is recommended in the near future after resolution of the patient's acute illness to characterize this lesion. 6. Additional incidental findings, as above.        DG Abd Portable 1 View      Assessment and Plan   Deborah [name] is an 77 y.o. female with severe abdominal pain, found on CT to have a severe small bowel  obstruction with concern for internal hernia or closed-loop obstruction.  She has had an NG tube for some time at [facility], but has had no relief, improvement, or really any change in her symptoms.  Due to the severity of the small bowel distention, concern for closed-loop obstruction, and concerning findings on abdominal exam, I recommended emergent exploratory laparotomy.  I discussed the procedure itself as well as its risk, benefits, and alternatives with the patient.  We discussed the perioperative care and recovery.  After full discussion all questions answered the patient granted consent to proceed.  We will proceed to the operating room as soon as time is available.    ICD-10-CM   1. Small bowel obstruction  K56.609        Quentin OrePaul J Wasyl Dornfeld, MD  [facility] Use [email or web address] to contact on call provider  New Patient Billing: [phone] - High MDM

## 2022-12-07 NOTE — ED Notes (Signed)
NG repositioned , 53 on the NG.

## 2022-12-07 NOTE — Transfer of Care (Signed)
Immediate Anesthesia Transfer of Care Note  Patient: Deborah [name]. Mcveigh  Procedure(s) Performed: OPEN RIGHT COLECTOMY (Abdomen)  Patient Location: PACU  Anesthesia Type:General  Level of Consciousness: awake, alert , and oriented  Airway & Oxygen Therapy: Patient Spontanous Breathing  Post-op Assessment: Report given to RN and Post -op Vital signs reviewed and stable  Post vital signs: Reviewed and stable  Last Vitals:  Vitals Value Taken Time  BP 187/95 12/07/22 1346  Temp    Pulse 102 12/07/22 1351  Resp 12 12/07/22 1351  SpO2 92% 12/07/22 1351  Vitals shown include unvalidated device data.  Last Pain:  Vitals:   12/07/22 1017  TempSrc: Oral  PainSc: 0-No pain         Complications: No notable events documented.

## 2022-12-07 NOTE — Discharge Instructions (Addendum)
WOUND CARE: - midline dressing to be changed daily - supplies: sterile saline, gauze, scissors, tape  - remove dressing and all packing carefully, moistening with sterile saline as needed to avoid packing/internal dressing sticking to the wound. - clean edges of skin around the wound with water/gauze, making sure there is no tape debris or leakage left on skin that could cause skin irritation or breakdown. - dampen and clean gauze with sterile saline and pack wound from wound base to skin level, making sure to take note of any possible areas of wound tracking, tunneling and packing appropriately. Wound can be packed loosely. Trim gauze to size if a whole gauze is not required. - cover wound with a dry gauze and secure with tape.  - write the date/time on the dry dressing/tape to better track when the last dressing change occurred. - change dressing as needed if leakage occurs, wound gets contaminated, or patient requests to shower. - patient may shower daily with wound open (i.e. remove all packing) and following the shower the wound should be dried and a clean dressing placed.        POST OP INSTRUCTIONS AFTER COLON SURGERY  DIET: Be sure to include lots of fluids daily to stay hydrated - 64oz of water per day (8, 8 oz glasses).  Avoid fast food or heavy meals for the first couple of weeks as your are more likely to get nauseated. Avoid raw/uncooked fruits or vegetables for the first 4 weeks (its ok to have these if they are blended into smoothie form). If you have fruits/vegetables, make sure they are cooked until soft enough to mash on the roof of your mouth and chew your food well. Otherwise, diet as tolerated.  Take your usually prescribed home medications unless otherwise directed.  PAIN CONTROL: Pain is best controlled by a usual combination of three different methods TOGETHER: Ice/Heat Over the counter pain medication Prescription pain medication Most patients will experience some  swelling and bruising around the surgical site.  Ice packs or heating pads (30-60 minutes up to 6 times a day) will help. Some people prefer to use ice alone, heat alone, alternating between ice & heat.  Experiment to what works for you.  Swelling and bruising can take several weeks to resolve.   It is helpful to take an over-the-counter pain medication regularly for the first few weeks: Ibuprofen (Motrin/Advil) -  tabs - take 3 tabs ( ) every 6 hours as needed for pain (unless you have been directed previously to avoid NSAIDs/ibuprofen) Acetaminophen (Tylenol) - you may take  every 6 hours as needed. You can take this with motrin as they act differently on the body. If you are taking a narcotic pain medication that has acetaminophen in it, do not take over the counter tylenol at the same time. NOTE: You may take both of these medications together - most patients  find it most helpful when alternating between the two (i.e. Ibuprofen at 6am, tylenol at 9am, ibuprofen at 12pm ..Marland Kitchen) A  prescription for pain medication should be given to you upon discharge.  Take your pain medication as prescribed if your pain is not adequatly controlled with the over-the-counter pain reliefs mentioned above.  Avoid getting constipated.  Between the surgery and the pain medications, it is common to experience some constipation.  Increasing fluid intake and taking a fiber supplement (such as Metamucil, Citrucel, FiberCon, MiraLax, etc) 1-2 times a day regularly will usually help prevent this problem from occurring.  A mild  laxative (prune juice, Milk of Magnesia, MiraLax, etc) should be taken according to package directions if there are no bowel movements after 48 hours.    Dressing: Your incisions are covered in Dermabond which is like sterile superglue for the skin. This will come off on it's own in a couple weeks. It is waterproof and you may bathe normally starting the day after your surgery in a shower. Avoid  baths/pools/lakes/oceans until your wounds have fully healed.  ACTIVITIES as tolerated:   Avoid heavy lifting (>10lbs or 1 gallon of milk) for the next 6 weeks. You may resume regular daily activities as tolerated--such as daily self-care, walking, climbing stairs--gradually increasing activities as tolerated.  If you can walk 30 minutes without difficulty, it is safe to try more intense activity such as jogging, treadmill, bicycling, low-impact aerobics.  DO NOT PUSH THROUGH PAIN.  Let pain be your guide: If it hurts to do something, don't do it. You may drive when you are no longer taking prescription pain medication, you can comfortably wear a seatbelt, and you can safely maneuver your car and apply brakes.  FOLLOW UP in our office Please call [facility] at [phone] to set up an appointment to see your surgeon in the office for a follow-up appointment approximately 2 weeks after your surgery. Make sure that you call for this appointment the day you arrive home to insure a convenient appointment time.  9. If you have disability or family leave forms that need to be completed, you may have them completed by your primary care physician's office; for return to work instructions, please ask our office staff and they will be happy to assist you in obtaining this documentation   When to call us [phone]: Poor pain control Reactions / problems with new medications (rash/itching, etc)  Fever over 101.5 F (38.5 C) Inability to urinate Nausea/vomiting Worsening swelling or bruising Continued bleeding from incision. Increased pain, redness, or drainage from the incision  The clinic staff is available to answer your questions during regular business hours (8:30am-5pm).  Please don't hesitate to call and ask to speak to one of our nurses for clinical concerns.   A surgeon from [facility] is always on call at the hospitals   If you have a medical emergency, go to the nearest  emergency room or call 911.  [facility] [address], [place], [place]  [address] MAIN: [phone] FAX: [phone] [email or web address]

## 2022-12-07 NOTE — ED Provider Notes (Addendum)
I assumed care of the patient at 0 700.  Briefly the patient is a 77 year old female who is awaiting ED to ED transfer due to a small bowel obstruction.  She was awaiting a KUB post NG tube placement.  On my independent interpretation it appears that the NG tube is kinked.  Confirmed by radiology read.  Will attempt to reposition.  Tube repositioned but transport is here to take her ED to ED.  Do not want to delay transport for repeat x-ray. Still with gastric output.   Melene Plan, DO 12/07/22 0755    Melene Plan, DO 12/07/22 [phone]

## 2022-12-07 NOTE — ED Provider Notes (Signed)
Patient transferred here from [facility] ER for small bowel obstruction. NG tube in place. General surgery overnight requested ED to ED transport for surgical consultation.   Physical Exam  BP (!) 147/86 (BP Location: Right Arm)   Pulse 93   Temp 98.6 F (37 C) (Oral)   Resp 17   Ht 5\' 3"  (1.6 m)   Wt 45.4 kg   SpO2 97%   BMI 17.71 kg/m   Physical Exam Vitals and nursing note reviewed.  Constitutional:      Appearance: Normal appearance.  HENT:     Head: Normocephalic and atraumatic.     Nose:     Comments: NG tube in place, gastric contents draining Eyes:     Conjunctiva/sclera: Conjunctivae normal.  Pulmonary:     Effort: Pulmonary effort is normal. No respiratory distress.  Skin:    General: Skin is warm and dry.  Neurological:     Mental Status: She is alert.  Psychiatric:        Mood and Affect: Mood normal.        Behavior: Behavior normal.    ED Course / MDM    Medical Decision Making Amount and/or Complexity of Data Reviewed Labs: ordered. Radiology: ordered.  Risk Prescription drug management.  On my exam patient is lying comfortably in exam bed, NG tube in place. States morphine given previously helped significantly with pain. No nausea at this time.  3212 -- Consulted with surgeon Dr Dossie Der who will come see patient.   0900 -- Admitted by surgery service. The patient appears reasonably stabilized for admission considering the current resources, flow, and capabilities available in the ED at this time, and I doubt any other [facility] requiring further screening and/or treatment in the ED prior to admission.   Jeanella Flattery 12/07/22 0901    Glendora Score, MD 12/07/22 2014

## 2022-12-07 NOTE — Op Note (Signed)
Patient: Deborah [name]. Bata (20-Dec-1945, [id number])  Date of Surgery: 12/07/2022   Preoperative Diagnosis: Bowel obstruction  Postoperative Diagnosis: [name]  Surgical Procedure: OPEN RIGHT COLECTOMY: 49000 (CPT)   Operative Team Members:  Surgeon(s) and Role:    * Deborah [name], Deborah Hopes, MD - Primary   Anesthesiologist: Deborah Median, DO CRNA: Deborah Goldsmith, CRNA; Deborah Perla, CRNA   Anesthesia: General   Fluids:  Total I/O In: 1103.7 [I.V.:1003.7; IV Piggyback:100] Out: 25 [Blood:25]  Complications: None  Drains: None  Specimen:  ID Type Source Tests Collected by Time Destination  1 : Right Colon Tissue PATH Soft tissue SURGICAL PATHOLOGY Deborah [name], Deborah Hopes, MD 12/07/2022 1250      Disposition:  PACU - hemodynamically stable.  Plan of Care: Admit to inpatient     Indications for Procedure: Deborah [name] is a 77 y.o. female who presented with abdominal pain.  CT was concerning for a bowel obstruction.  Her symptoms did not improve with nasogastric tube decompression and her abdominal exam remained concerning so I recommended exploratory laparotomy with possible bowel resection.  The procedure itself as well as its risks, benefits and alternatives were discussed.  The risks discussed included but were not limited to the risk of infection, bleeding, damage to nearby structures, and possible additional surgery or procedures postoperatively.  After a full discussion and all questions answered the patient granted consent to proceed.  Findings: [name]   Description of Procedure:   On the date stated above the patient was taken to the operating room suite and placed in supine position.  General endotracheal anesthesia was induced.  A timeout was completed verifying the correct patient, procedure, positioning, and equipment needed for the case.  The patient's abdomen was prepped and draped in usual sterile fashion.  I made a midline laparotomy  incision and entered the abdomen without any trauma to the underlying viscera.  There were adhesions between the previous Pfannenstiel incision and the omentum which were divided using electrocautery.  The abdomen was inspected.  The small bowel was run to the from the ligament of Treitz to the terminal ileum.  The cecum was massively distended.  A point of obstruction was identified in the ascending colon with some omental adhesions in this area as well to the abdominal sidewall which were divided using electrocautery.  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  I decided perform a right colectomy.  I divided the small bowel near the terminal ileum using a white load of the endoscopic linear 60 mm stapler.  I mobilized the right colon out of the retroperitoneum near the area of obstruction.  The cecum was already well mobilized by the pathology.  The hepatic flexure was mobilized.  I visualized the middle colic artery and perfusion to the colon and picked a location on the proximal transverse colon and the colon was divided using a white load of the 60 mm endoscopic linear stapler.  The mesocolon was divided using the LigaSure.  The right colic vessels were ligated using a 2-0 silk suture and then divided using the LigaSure.  The right colon was passed off the field as a specimen.  I ran the bowel to ensure I was not twisting the small bowel.  The anastomosis was created by making enterotomies along the tinea of the transverse colon and on the antimesenteric border of the terminal ileum near the staple  line.  The 60 mm white load of the endoscopic linear stapler was inserted into these enterotomies and fired to create the anastomosis.  The common enterotomy was closed using a final firing of the 60 mm white load of the endoscopic linear stapler.  The staple lines were oversewn using 2-0 Vicryl suture.  The mesenteric defect  was closed using running silk suture.  The anastomosis was returned to the abdomen the abdomen was irrigated with multiple liters of saline irrigation.  The midline laparotomy was closed using running 0 PDS suture.  The skin was closed loosely using staples and a sterile dressing was applied.  All sponge needle counts were correct at the end of this case.    At the end of the case we reviewed the infection status of the case. Patient: Deborah [name] Emergency General Surgery Service Patient Case: Emergent Infection Present At Time Of Surgery (PATOS):  Some contamination related to creating the ileocolic anastomosis  Deborah Drape, MD General, Bariatric, & Minimally Invasive Surgery [facility], [place]

## 2022-12-07 NOTE — Anesthesia Procedure Notes (Signed)
Procedure Name: Intubation Date/Time: 12/07/2022 12:13 PM  Performed by: Lelon Perla, CRNAPre-anesthesia Checklist: Patient identified, Emergency Drugs available, Suction available and Patient being monitored Patient Re-evaluated:Patient Re-evaluated prior to induction Oxygen Delivery Method: Circle system utilized Preoxygenation: Pre-oxygenation with 100% oxygen Induction Type: IV induction Ventilation: Mask ventilation without difficulty Laryngoscope Size: Mac and 3 Grade View: Grade II Tube type: Oral Tube size: 7.0 mm Number of attempts: 1 Airway Equipment and Method: Stylet and Oral airway Placement Confirmation: ETT inserted through vocal cords under direct vision, positive ETCO2 and breath sounds checked- equal and bilateral Secured at: 21 cm Tube secured with: Tape Dental Injury: Teeth and Oropharynx as per pre-operative assessment  Comments: CRNA attempt x2 grade II b with MAC 3 unable to pass ETT. MD attempt x1 succesfully passed ETT with Grade II b view.

## 2022-12-07 NOTE — ED Notes (Signed)
Patient hooked up to LIS to NG tube placed at [facility], provider at bedside.

## 2022-12-08 ENCOUNTER — Encounter (HOSPITAL_COMMUNITY): Payer: Self-pay | Admitting: Surgery

## 2022-12-08 LAB — BASIC METABOLIC PANEL
Anion gap: 11 (ref 5–15)
BUN: 15 mg/dL (ref 8–23)
CO2: 24 mmol/L (ref 22–32)
Calcium: 8.4 mg/dL — ABNORMAL LOW (ref 8.9–10.3)
Chloride: 98 mmol/L (ref 98–111)
Creatinine, Ser: 0.59 mg/dL (ref 0.44–1.00)
GFR, Estimated: >60 mL/min (ref >60)
Glucose, Bld: 110 mg/dL — ABNORMAL HIGH (ref 70–99)
Potassium: 4.1 mmol/L (ref 3.5–5.1)
Sodium: 133 mmol/L — ABNORMAL LOW (ref 135–145)

## 2022-12-08 LAB — CBC
HCT: 33.9 % — ABNORMAL LOW (ref 36.0–46.0)
Hemoglobin: 12 g/dL (ref 12.0–15.0)
MCH: 34 pg (ref 26.0–34.0)
MCHC: 35.4 g/dL (ref 30.0–36.0)
MCV: 96 fL (ref 80.0–100.0)
Platelets: 214 10*3/uL (ref 150–400)
RBC: 3.53 MIL/uL — ABNORMAL LOW (ref 3.87–5.11)
RDW: 13.2 % (ref 11.5–15.5)
WBC: 9.2 10*3/uL (ref 4.0–10.5)
nRBC: 0 % (ref 0.0–0.2)

## 2022-12-08 MED ORDER — ASPIRIN 81 MG PO CHEW
81.0000 mg | CHEWABLE_TABLET | Freq: Every day | ORAL | Status: DC
  Administered 2022-12-08 – 2022-12-18 (×11): 81 mg via ORAL
  Filled 2022-12-08 (×10): qty 1

## 2022-12-08 MED ORDER — OXYCODONE HCL 5 MG PO TABS: 5.0000 mg | ORAL_TABLET | ORAL | Status: DC | PRN

## 2022-12-08 MED ORDER — BOOST / RESOURCE BREEZE PO LIQD CUSTOM
1.0000 | Freq: Three times a day (TID) | ORAL | Status: DC
  Administered 2022-12-08 – 2022-12-10 (×6): 1 via ORAL

## 2022-12-08 MED ORDER — METHIMAZOLE 5 MG PO TABS
5.0000 mg | ORAL_TABLET | ORAL | Status: DC
  Administered 2022-12-08 – 2022-12-16 (×5): 5 mg via ORAL
  Filled 2022-12-08 (×7): qty 1

## 2022-12-08 MED ORDER — METOPROLOL TARTRATE 25 MG PO TABS
25.0000 mg | ORAL_TABLET | Freq: Two times a day (BID) | ORAL | Status: DC
  Administered 2022-12-08 – 2022-12-18 (×20): 25 mg via ORAL
  Filled 2022-12-08 (×20): qty 1

## 2022-12-08 MED ORDER — OXYCODONE HCL 5 MG PO TABS: 2.5000 mg | ORAL_TABLET | ORAL | Status: DC | PRN

## 2022-12-08 MED ORDER — TRAMADOL HCL 50 MG PO TABS: 50.0000 mg | ORAL_TABLET | Freq: Four times a day (QID) | ORAL | Status: DC | PRN

## 2022-12-08 MED ORDER — ACETAMINOPHEN 325 MG PO TABS
650.0000 mg | ORAL_TABLET | Freq: Four times a day (QID) | ORAL | Status: DC
  Administered 2022-12-08 – 2022-12-09 (×3): 650 mg via ORAL
  Filled 2022-12-08 (×3): qty 2

## 2022-12-08 MED ORDER — PRAVASTATIN SODIUM 40 MG PO TABS
40.0000 mg | ORAL_TABLET | Freq: Every day | ORAL | Status: DC
  Administered 2022-12-08 – 2022-12-16 (×6): 40 mg via ORAL
  Filled 2022-12-08 (×8): qty 1

## 2022-12-08 NOTE — Evaluation (Signed)
Physical Therapy Evaluation Patient Details Name: Deborah [name] MRN: [id number] DOB: Jan 12, 1946 Today's Date: 12/08/2022  History of Present Illness  15 female presents to ED on 4/7 with abdominal pain, n/v. CT abd/pelvis shows severe SBO, the appearance of which suggest a probable internal hernia. S/p open R colectomy on 4/7. PMH includes CAD s/p CABG, HLD, HTN, grave's disease, cancer, bilat RTC repair.  Clinical Impression   Pt presents with abdominal pain, impaired bed mobility s/p abdominal surgery, increased time and effort to mobilize, and decreased activity tolerance. Pt to benefit from acute PT to address deficits. Pt ambulated room distance with RW, pt limited in distance by pain and severe nausea. PT anticipates pt will progress well. PT to progress mobility as tolerated, and will continue to follow acutely.         Recommendations for follow up therapy are one component of a multi-disciplinary discharge planning process, led by the attending physician.  Recommendations may be updated based on patient status, additional functional criteria and insurance authorization.  Follow Up Recommendations       Assistance Recommended at Discharge Intermittent Supervision/Assistance  Patient can return home with the following  A little help with walking and/or transfers;A little help with bathing/dressing/bathroom    Equipment Recommendations Rolling walker (2 wheels)  Recommendations for Other Services       Functional Status Assessment Patient has had a recent decline in their functional status and demonstrates the ability to make significant improvements in function in a reasonable and predictable amount of time.     Precautions / Restrictions Precautions Precautions: Other (comment) Precaution Comments: abdominal - log roll to/from EOB, abdominal bracing with coughing with pillow vs blanket rolls Restrictions Weight Bearing Restrictions: No      Mobility  Bed  Mobility Overal bed mobility: Needs Assistance Bed Mobility: Rolling, Sidelying to Sit, Sit to Sidelying Rolling: Supervision Sidelying to sit: Supervision, HOB elevated     Sit to sidelying: HOB elevated, Min assist General bed mobility comments: pt required cueing for management of abdominal area and teaching/instructions for log rolling; pt used gaurd rails to help herself rise to EOB; PT held pt's legs to return to supine    Transfers Overall transfer level: Needs assistance Equipment used: Rolling walker (2 wheels) Transfers: Sit to/from Stand Sit to Stand: Supervision           General transfer comment: pt initiated stand, increased time to rise    Ambulation/Gait Ambulation/Gait assistance: Min guard Gait Distance (Feet): 20 Feet Assistive device: Rolling walker (2 wheels) Gait Pattern/deviations: Step-through pattern, Decreased stride length, Trunk flexed Gait velocity: decr     General Gait Details: cues for upright posture, placement in RW. limited in tolerance by nausea  Stairs            Wheelchair Mobility    Modified Rankin (Stroke Patients Only)       Balance Overall balance assessment: Needs assistance Sitting-balance support: Feet supported, Bilateral upper extremity supported Sitting balance-Leahy Scale: Good     Standing balance support: No upper extremity supported, During functional activity Standing balance-Leahy Scale: Fair                               Pertinent Vitals/Pain Pain Assessment Pain Assessment: Faces Faces Pain Scale: Hurts little more Pain Location: abdomen Pain Descriptors / Indicators: Guarding Pain Intervention(s): Limited activity within patient's tolerance, Monitored during session, Repositioned    Home Living Family/patient  expects to be discharged to:: Private residence Living Arrangements: Alone   Type of Home: Independent living facility Home Access: Elevator;Stairs to enter   [employer] [facility] [employer]: 3 floor apt; pt lives on 2nd floor and takes stairs     Home Equipment: Grab bars - toilet;Grab bars - tub/shower;Shower seat - built in      Prior Function Prior Level of Function : Independent/Modified Independent                     Hand Dominance   Dominant Hand: Right    Extremity/Trunk Assessment   Upper Extremity Assessment Upper Extremity Assessment: Defer to OT evaluation    Lower Extremity Assessment Lower Extremity Assessment: Overall WFL for tasks assessed    Cervical / Trunk Assessment Cervical / Trunk Assessment: Other exceptions Cervical / Trunk Exceptions: scoliosis  Communication   Communication: No difficulties  Cognition Arousal/Alertness: Awake/alert Behavior During Therapy: WFL for tasks assessed/performed Overall Cognitive Status: Within Functional Limits for tasks assessed                                 General Comments: pt very nauseous, but resisted vomiting during session        General Comments      Exercises     Assessment/Plan    PT Assessment Patient needs continued PT services  PT Problem List Decreased mobility;Decreased activity tolerance;Decreased knowledge of use of DME;Pain;Decreased safety awareness       PT Treatment Interventions DME instruction;Therapeutic activities;Gait training;Therapeutic exercise;Patient/family education;Balance training;Functional mobility training;Neuromuscular re-education    PT Goals (Current goals can be found in the Care Plan section)  Acute Rehab PT Goals Patient Stated Goal: home PT Goal Formulation: With patient Time For Goal Achievement: 12/22/22 Potential to Achieve Goals: Good    Frequency Min 3X/week     Co-evaluation               AM-PAC PT "6 Clicks" Mobility  Outcome Measure Help needed turning from your back to your side while in a flat bed without using bedrails?: A Little Help needed moving from lying on your back to  sitting on the side of a flat bed without using bedrails?: A Little Help needed moving to and from a bed to a chair (including a wheelchair)?: A Little Help needed standing up from a chair using your arms (e.g., wheelchair or bedside chair)?: A Little Help needed to walk in hospital room?: A Little Help needed climbing 3-5 steps with a railing? : A Little 6 Click Score: 18    End of Session   Activity Tolerance: Patient tolerated treatment well;Other (comment) (limited by nausea) Patient left: in bed;with call bell/phone within reach;with nursing/sitter in room Nurse Communication: Mobility status PT Visit Diagnosis: Other abnormalities of gait and mobility (R26.89)    Time: 2353-6144 PT Time Calculation (min) (ACUTE ONLY): 32 min   Charges:   PT Evaluation $PT Eval Low Complexity: 1 Low PT Treatments $Therapeutic Activity: 8-22 mins        Marye Round, PT DPT Acute Rehabilitation Services Pager [phone]  Office [phone]   Tyrone Apple E Christain [place] 12/08/2022, 1:38 PM

## 2022-12-08 NOTE — Progress Notes (Signed)
Progress Note  1 Day Post-Op  Subjective: Still feeling distended and "full" but less than from before surgery. Abdominal pain also present but improved. Feeling sore and taking tylenol for pain. Denies nausea, emesis, SHOB. No flatus or BM. Has not been oob yet since surgery    Objective: Vital signs in last 24 hours: Temp:  [97.7 F (36.5 C)-99.5 F (37.5 C)] 98.8 F (37.1 C) (04/08 0753) Pulse Rate:  [76-99] 87 (04/08 0753) Resp:  [1-25] 16 (04/08 0753) BP: (123-193)/(63-100) 128/76 (04/08 0753) SpO2:  [91 %-99 %] 99 % (04/08 0753) Weight:  [45.4 kg] 45.4 kg (04/07 1129) Last BM Date : 12/05/22  Intake/Output from previous day: 04/07 0701 - 04/08 0700 In: 1343.5 [I.V.:1223.5; NG/GT:20; IV Piggyback:100] Out: 1400 [Urine:1375; Blood:25] Intake/Output this shift: No intake/output data recorded.  PE: General: pleasant, WD, female who is laying in bed in NAD Heart: regular, rate, and rhythm.  Lungs: CTAB, no wheezes, rhonchi, or rales noted.  Respiratory effort nonlabored on supp O2 via [place] Abd: soft, +BS, mild distension. Moderate TTP greatest over incision which is covered with abd - cdi MSK: all 4 extremities are symmetrical with no cyanosis, clubbing, or edema. SCDs in place. No calf TTP Skin: warm and dry Psych: A&Ox3 with an appropriate affect.    Lab Results:  Recent Labs    12/07/22 1552 12/08/22 0424  WBC 11.8* 9.2  HGB 13.1 12.0  HCT 39.5 33.9*  PLT 258 214   BMET Recent Labs    12/07/22 0340 12/07/22 1552 12/08/22 0424  NA 132*  --  133*  K 4.0  --  4.1  CL 92*  --  98  CO2 24  --  24  GLUCOSE 153*  --  110*  BUN 22  --  15  CREATININE 0.74 0.56 0.59  CALCIUM 10.8*  --  8.4*   PT/INR No results for input(s): "LABPROT", "INR" in the last 72 hours. CMP     Component Value Date/Time   NA 133 (L) 12/08/2022 0424   K 4.1 12/08/2022 0424   CL 98 12/08/2022 0424   CO2 24 12/08/2022 0424   GLUCOSE 110 (H) 12/08/2022 0424   BUN 15 12/08/2022  0424   CREATININE 0.59 12/08/2022 0424   CALCIUM 8.4 (L) 12/08/2022 0424   PROT 8.2 (H) 12/07/2022 0340   ALBUMIN 5.1 (H) 12/07/2022 0340   AST 23 12/07/2022 0340   ALT 16 12/07/2022 0340   ALKPHOS 61 12/07/2022 0340   BILITOT 1.1 12/07/2022 0340   GFRNONAA >60 12/08/2022 0424   GFRAA >60 03/04/2018 0333   Lipase     Component Value Date/Time   LIPASE 50 12/07/2022 0340       Studies/Results: DG Abd Portable 1 View  Result Date: 12/07/2022 CLINICAL DATA:  77 year old female NG tube placement. EXAM: PORTABLE ABDOMEN - 1 VIEW COMPARISON:  CT Abdomen and Pelvis 0456 hours today. FINDINGS: Portable AP supine view at 0645 hours. The enteric tube courses to the level of the gastric hernia, gastroesophageal junction but then is folded back on itself, at the level of the diaphragm. Lung bases remain clear. Prior CABG. Stable visible bowel gas pattern, with excreted renal IV contrast now. Scoliosis. IMPRESSION: 1. Enteric tube is kinked at the level of the hiatal hernia. Recommend repositioning attempt with repeat KUB. 2. Otherwise stable from CT Abdomen and Pelvis 0456 hours today. Electronically Signed   By: Odessa Fleming M.D.   On: 12/07/2022 07:21   CT ABDOMEN PELVIS  W CONTRAST  Result Date: 12/07/2022 CLINICAL DATA:  77 year old female with history of acute onset of nonlocalized abdominal pain. EXAM: CT ABDOMEN AND PELVIS WITH CONTRAST TECHNIQUE: Multidetector CT imaging of the abdomen and pelvis was performed using the standard protocol following bolus administration of intravenous contrast. RADIATION DOSE REDUCTION: This exam was performed according to the departmental dose-optimization program which includes automated exposure control, adjustment of the mA and/or kV according to patient size and/or use of iterative reconstruction technique. CONTRAST:  100mL OMNIPAQUE IOHEXOL 300 MG/ML  SOLN COMPARISON:  CT of the chest, abdomen and pelvis 03/28/2018. FINDINGS: Lower chest: Atherosclerotic  calcifications in the descending thoracic aorta as well as the left anterior descending, left circumflex and right coronary arteries. Status post median sternotomy. Moderate-sized hiatal hernia. Hepatobiliary: No suspicious cystic or solid hepatic lesions. No intra or extrahepatic biliary ductal dilatation. Gallbladder is unremarkable in appearance. Pancreas: No pancreatic mass. No pancreatic ductal dilatation. No pancreatic or peripancreatic fluid collections or inflammatory changes. Spleen: Unremarkable. Adrenals/Urinary Tract: Multiple subcentimeter low-attenuation lesions in both kidneys, too small to characterize, but statistically likely to represent cysts (no imaging follow-up recommended). In addition, in the upper pole of the left kidney (coronal image 25 of series 5) there is a 1.2 cm intermediate attenuation (32 HU) lesion which may have some internal architecture, considered indeterminate. No hydroureteronephrosis. Urinary bladder is nearly completely decompressed, but otherwise unremarkable in appearance. Bilateral adrenal glands are normal in appearance. Stomach/Bowel: Stomach is only mildly distended with gas and fluid. Proximal small bowel (duodenum and proximal jejunum) appears relatively decompressed however, just distal to this there is a severely dilated segment of bowel in the left side of the abdomen measuring up to 8.1 cm in diameter, which presumably reflects a portion of the jejunum (axial image 44 of series 2) with a large air-fluid level within it. There is an acute waist like narrowing in the right-side of the abdomen at the distal aspect of this dilated loop of bowel (axial image 51 of series 2), beyond which there are multiple other borderline dilated mildly dilated loops of small bowel, with several air-fluid levels measuring up to 3.1 cm in diameter. The distal aspect of the ileum, and the entire colon otherwise appear completely decompressed. Numerous colonic diverticuli are noted,  without definite focal surrounding inflammatory changes to suggest an acute diverticulitis at this time. The appendix is not confidently identified and may be surgically absent. Regardless, there are no inflammatory changes noted adjacent to the cecum to suggest the presence of an acute appendicitis at this time. Vascular/Lymphatic: Atherosclerosis throughout the abdominal aorta and pelvic vasculature, without evidence of aneurysm or dissection. No lymphadenopathy noted in the abdomen or pelvis. Reproductive: Uterus and ovaries are atrophic, but otherwise unremarkable in appearance. Other: No significant volume of ascites.  No pneumoperitoneum. Musculoskeletal: Dextroscoliosis of the lumbar spine. There are no aggressive appearing lytic or blastic lesions noted in the visualized portions of the skeleton. IMPRESSION: 1. Severe small-bowel obstruction, as above, the appearance of which suggests probable internal hernia. The largest dilated loop of small bowel measures up to 8.1 cm in diameter. Emergent surgical consultation is strongly recommended. 2. Colonic diverticulosis without evidence of acute diverticulitis at this time. 3. Small hiatal hernia. 4. Aortic atherosclerosis, in addition to three-vessel coronary artery disease. Assessment for potential risk factor modification, dietary therapy or pharmacologic therapy may be warranted, if clinically indicated. 5. Small indeterminate lesion in the upper pole of the left kidney measuring 1.2 cm in diameter. Follow-up outpatient nonemergent  abdominal MRI with and without IV gadolinium is recommended in the near future after resolution of the patient's acute illness to characterize this lesion. 6. Additional incidental findings, as above. Critical Value/emergent results were called by telephone at the time of interpretation on 12/07/2022 at 6:07 am to provider Geoffery Lyons, who verbally acknowledged these results. Electronically Signed   By: Trudie Reed M.D.   On:  12/07/2022 06:09    Anti-infectives: Anti-infectives (From admission, onward)    Start     Dose/Rate Route Frequency Ordered Stop   12/07/22 1000  ceFAZolin (ANCEF) IVPB 2g/100 mL premix        2 g 200 mL/hr over 30 Minutes Intravenous To 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 Royanne Foots - initial imaging concerning for SBO but OR findings as above - surgical path pending - discontinue NGT and start CLD. Monitor bowel function - dc foley - encouraged OOB. PT ordered. Ordered IS  FEN: CLD, LR @ 100 ml/hr ID: ancef periop VTE: lovenox Foley: periop, TOV today  Dispo: lives in independent living at [place]. There are rehab options there if needed    LOS: 1 day   Eric Form, [facility] 12/08/2022, 7:59 AM Please see Amion for pager number during day hours 7:00am-4:30pm

## 2022-12-09 LAB — CBC
HCT: 37.5 % (ref 36.0–46.0)
Hemoglobin: 12.6 g/dL (ref 12.0–15.0)
MCH: 32.9 pg (ref 26.0–34.0)
MCHC: 33.6 g/dL (ref 30.0–36.0)
MCV: 97.9 fL (ref 80.0–100.0)
Platelets: 241 10*3/uL (ref 150–400)
RBC: 3.83 MIL/uL — ABNORMAL LOW (ref 3.87–5.11)
RDW: 12.8 % (ref 11.5–15.5)
WBC: 8.2 10*3/uL (ref 4.0–10.5)
nRBC: 0 % (ref 0.0–0.2)

## 2022-12-09 LAB — BASIC METABOLIC PANEL
Anion gap: 9 (ref 5–15)
BUN: 18 mg/dL (ref 8–23)
CO2: 26 mmol/L (ref 22–32)
Calcium: 8 mg/dL — ABNORMAL LOW (ref 8.9–10.3)
Chloride: 98 mmol/L (ref 98–111)
Creatinine, Ser: 0.5 mg/dL (ref 0.44–1.00)
GFR, Estimated: >60 mL/min (ref >60)
Glucose, Bld: 116 mg/dL — ABNORMAL HIGH (ref 70–99)
Potassium: 3.8 mmol/L (ref 3.5–5.1)
Sodium: 133 mmol/L — ABNORMAL LOW (ref 135–145)

## 2022-12-09 MED ORDER — PANTOPRAZOLE SODIUM 40 MG IV SOLR
40.0000 mg | INTRAVENOUS | Status: DC
  Administered 2022-12-09 – 2022-12-17 (×9): 40 mg via INTRAVENOUS
  Filled 2022-12-09 (×9): qty 10

## 2022-12-09 MED ORDER — METOPROLOL TARTRATE 5 MG/5ML IV SOLN: 5.0000 mg | Freq: Four times a day (QID) | INTRAVENOUS | Status: DC | PRN

## 2022-12-09 MED ORDER — ALUM & MAG HYDROXIDE-SIMETH 200-200-20 MG/5ML PO SUSP
15.0000 mL | ORAL | Status: DC | PRN
  Administered 2022-12-09: 15 mL via ORAL
  Filled 2022-12-09: qty 30

## 2022-12-09 MED ORDER — ACETAMINOPHEN 500 MG PO TABS
1000.0000 mg | ORAL_TABLET | Freq: Four times a day (QID) | ORAL | Status: DC
  Administered 2022-12-09 (×3): 1000 mg via ORAL
  Filled 2022-12-09 (×12): qty 2

## 2022-12-09 NOTE — Progress Notes (Signed)
Mobility Specialist - Progress Note   12/09/22 1445  Mobility  Activity Ambulated with assistance in hallway  Level of Assistance Standby assist, set-up cues, supervision of patient - no hands on  Assistive Device None  Distance Ambulated (ft) 230 ft  Activity Response Tolerated well  Mobility Referral Yes  $Mobility charge 1 Mobility   Pt was received EOB and agreeable to mobility. Pt c/o feeling wobbly during ambulation. No faults throughout session. Pt was returned to chair with all needs met.   Alda Lea  Mobility Specialist Please contact via Special educational needs teacher or Rehab office at [phone]

## 2022-12-09 NOTE — Progress Notes (Signed)
Physical Therapy Treatment Patient Details Name: Deborah [name]. [place] MRN: [id number] DOB: [birth year] Today's Date: 12/09/2022   History of Present Illness 64 female presents to ED on 4/7 with abdominal pain, n/v. CT abd/pelvis shows severe SBO, the appearance of which suggest a probable internal hernia. S/p open R colectomy on 4/7. PMH includes CAD s/p CABG, HLD, HTN, grave's disease, cancer, bilat RTC repair.    PT Comments    Pt improving in activity tolerance and balance, ambulating hallway distance with SL support. Pt with improving pain tolerance and upright posture during mobility, and no reports of acute nausea today. PT to continue to follow, recommending mobility specialist following for mobilizing OOB multiple times a day.      Recommendations for follow up therapy are one component of a multi-disciplinary discharge planning process, led by the attending physician.  Recommendations may be updated based on patient status, additional functional criteria and insurance authorization.  Follow Up Recommendations       Assistance Recommended at Discharge Intermittent Supervision/Assistance  Patient can return home with the following A little help with walking and/or transfers;A little help with bathing/dressing/bathroom   Equipment Recommendations  Rolling walker (2 wheels)    Recommendations for Other Services       Precautions / Restrictions Precautions Precautions: Other (comment);Fall Precaution Comments: abdominal; pt aware of log rolling from PT's instructions last session Restrictions Weight Bearing Restrictions: No     Mobility  Bed Mobility Overal bed mobility: Needs Assistance Bed Mobility: Sidelying to Sit Rolling: Supervision Sidelying to sit: Supervision, HOB elevated       General bed mobility comments: pt utilized gaurd rails from [facility] elevated to R sidelying, then to sit EOB    Transfers Overall transfer level: Needs assistance Equipment used: Rolling  walker (2 wheels) Transfers: Sit to/from Stand Sit to Stand: Supervision           General transfer comment: supervision for safety, increased time to rise    Ambulation/Gait Ambulation/Gait assistance: Supervision Gait Distance (Feet): 230 Feet Assistive device: Rolling walker (2 wheels), IV Pole Gait Pattern/deviations: Step-through pattern, Trunk flexed Gait velocity: decr     General Gait Details: for safety; initially using RW, transitioned to IV pole given pt steadiness. Cuing for hallway navigation, AD use   Stairs             Wheelchair Mobility    Modified Rankin (Stroke Patients Only)       Balance Overall balance assessment: Mild deficits observed, not formally tested Sitting-balance support: Feet supported, No upper extremity supported Sitting balance-Leahy Scale: Good Sitting balance - Comments: pt's sitting balance independent with PT supervision; pt able to stand and walk upright with seemingly good core stability   Standing balance support: Reliant on assistive device for balance Standing balance-Leahy Scale: Fair                              Cognition Arousal/Alertness: Awake/alert Behavior During Therapy: WFL for tasks assessed/performed Overall Cognitive Status: Within Functional Limits for tasks assessed                                          Exercises      General Comments        Pertinent Vitals/Pain Pain Assessment Pain Assessment: Faces Faces Pain Scale: Hurts a little bit Pain Location:  abdomen Pain Descriptors / Indicators: Tender, Sore Pain Intervention(s): Limited activity within patient's tolerance, Monitored during session    Home Living                          Prior Function            PT Goals (current goals can now be found in the care plan section) Acute Rehab PT Goals Patient Stated Goal: home PT Goal Formulation: With patient Time For Goal Achievement:  12/22/22 Potential to Achieve Goals: Good Progress towards PT goals: Progressing toward goals    Frequency    Min 3X/week      PT Plan Current plan remains appropriate    Co-evaluation              AM-PAC PT "6 Clicks" Mobility   Outcome Measure  Help needed turning from your back to your side while in a flat bed without using bedrails?: None Help needed moving from lying on your back to sitting on the side of a flat bed without using bedrails?: A Little Help needed moving to and from a bed to a chair (including a wheelchair)?: A Little Help needed standing up from a chair using your arms (e.g., wheelchair or bedside chair)?: A Little Help needed to walk in hospital room?: A Little Help needed climbing 3-5 steps with a railing? : A Little 6 Click Score: 19    End of Session   Activity Tolerance: Patient tolerated treatment well Patient left: in chair;with call bell/phone within reach;with chair alarm set Nurse Communication: Mobility status PT Visit Diagnosis: Other abnormalities of gait and mobility (R26.89)     Time: 2831-5176 PT Time Calculation (min) (ACUTE ONLY): 18 min  Charges:  $Therapeutic Activity: 8-22 mins                     Marye Round, PT DPT Acute Rehabilitation Services Pager [phone]  Office [phone]    Tyrone Apple E Christain [place] 12/09/2022, 11:39 AM

## 2022-12-09 NOTE — Progress Notes (Addendum)
Progress Note  2 Days Post-Op  Subjective: Having significant nausea and taking zofran and compazine for this with some relief though it persists. Has had clear emesis x1. Feeling some burning consistent with reflux. Has sipped on some water and breeze but admits very little PO intake. Distension and pain are about the same as yesterday. No flatus or BM. Taking tylenol for pain. Getting out of bed and ambulating including with PT. Denies respiratory complaints. Voiding following catheter removal  Objective: Vital signs in last 24 hours: Temp:  [98 F (36.7 C)-98.7 F (37.1 C)] 98 F (36.7 C) (04/09 0738) Pulse Rate:  [84-95] 88 (04/09 0738) Resp:  [16-18] 17 (04/09 0738) BP: (138-172)/(68-88) 167/85 (04/09 0738) SpO2:  [94 %-97 %] 94 % (04/09 0738) Last BM Date : 12/05/22  Intake/Output from previous day: 04/08 0701 - 04/09 0700 In: -  Out: 350 [Urine:350] Intake/Output this shift: No intake/output data recorded.  PE: General: pleasant, WD, female who is laying in bed in NAD Heart: regular, rate, and rhythm.  Lungs: Respiratory effort nonlabored on room air Abd: soft, +BS, mild to moderate distension. Moderate TTP greatest over incision which is clean and intact with widely spaced staples. Scant bloody drainage from distal margin. No erythema MSK: all 4 extremities are symmetrical with no cyanosis, clubbing, or edema. No calf TTP Skin: warm and dry Psych: A&Ox3 with an appropriate affect.    Lab Results:  Recent Labs    12/08/22 0424 12/09/22 0554  WBC 9.2 8.2  HGB 12.0 12.6  HCT 33.9* 37.5  PLT 214 241    BMET Recent Labs    12/08/22 0424 12/09/22 0554  NA 133* 133*  K 4.1 3.8  CL 98 98  CO2 24 26  GLUCOSE 110* 116*  BUN 15 18  CREATININE 0.59 0.50  CALCIUM 8.4* 8.0*    PT/INR No results for input(s): "LABPROT", "INR" in the last 72 hours. CMP     Component Value Date/Time   NA 133 (L) 12/09/2022 0554   K 3.8 12/09/2022 0554   CL 98 12/09/2022  0554   CO2 26 12/09/2022 0554   GLUCOSE 116 (H) 12/09/2022 0554   BUN 18 12/09/2022 0554   CREATININE 0.50 12/09/2022 0554   CALCIUM 8.0 (L) 12/09/2022 0554   PROT 8.2 (H) 12/07/2022 0340   ALBUMIN 5.1 (H) 12/07/2022 0340   AST 23 12/07/2022 0340   ALT 16 12/07/2022 0340   ALKPHOS 61 12/07/2022 0340   BILITOT 1.1 12/07/2022 0340   GFRNONAA >60 12/09/2022 0554   GFRAA >60 03/04/2018 0333   Lipase     Component Value Date/Time   LIPASE 50 12/07/2022 0340       Studies/Results: No results found.  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 Dossie Der - initial imaging concerning for SBO but OR findings as above - surgical path pending - having significant nausea. Prn antiemetics. Will need NGT replacement if recurrent emesis which I discussed with her - back down to sips/chips and AROBF - voiding after foley removal - encouraged OOB. PT ordered. Ordered IS  FEN: NPO/sips/chips, LR @ 100 ml/hr ID: ancef periop  VTE: lovenox Foley: periop, TOV 4/8, voiding  Hyponatremia - chronic, cont IVF Underweight - BMI 17.72. resume diet when able CAD Thyroid disease - home med HLD - home med HTN - home med  Dispo: lives in independent living at [facility]. There are rehab options there if needed. PT reccs RW and no follow up    LOS: 2 days   Eric Form, [facility] Surgery 12/09/2022, 8:10 AM Please see Amion for pager number during day hours 7:00am-4:30pm

## 2022-12-10 LAB — BASIC METABOLIC PANEL
Anion gap: 13 (ref 5–15)
BUN: 19 mg/dL (ref 8–23)
CO2: 22 mmol/L (ref 22–32)
Calcium: 8.1 mg/dL — ABNORMAL LOW (ref 8.9–10.3)
Chloride: 97 mmol/L — ABNORMAL LOW (ref 98–111)
Creatinine, Ser: 0.58 mg/dL (ref 0.44–1.00)
GFR, Estimated: >60 mL/min (ref >60)
Glucose, Bld: 96 mg/dL (ref 70–99)
Potassium: 3.6 mmol/L (ref 3.5–5.1)
Sodium: 132 mmol/L — ABNORMAL LOW (ref 135–145)

## 2022-12-10 LAB — CBC
HCT: 35.5 % — ABNORMAL LOW (ref 36.0–46.0)
Hemoglobin: 12 g/dL (ref 12.0–15.0)
MCH: 32.9 pg (ref 26.0–34.0)
MCHC: 33.8 g/dL (ref 30.0–36.0)
MCV: 97.3 fL (ref 80.0–100.0)
Platelets: 234 10*3/uL (ref 150–400)
RBC: 3.65 MIL/uL — ABNORMAL LOW (ref 3.87–5.11)
RDW: 12.6 % (ref 11.5–15.5)
WBC: 7.7 10*3/uL (ref 4.0–10.5)
nRBC: 0 % (ref 0.0–0.2)

## 2022-12-10 NOTE — Care Management Important Message (Signed)
Important Message  Patient Details  Name: Deborah [name] MRN: [id number] Date of Birth: [birth year]   Medicare Important Message Given:  Yes     Zyah Gomm Stefan Church 12/10/2022, 2:41 PM

## 2022-12-10 NOTE — Progress Notes (Signed)
Progress Note  3 Days Post-Op  Subjective: Passing flatus and had bm this am. Still feeling nauseas and bloated but slightly less than yesterday. Abdominal pain is stable and controlled. Has been mobilizing.   Friend at bedside  Objective: Vital signs in last 24 hours: Temp:  [98 F (36.7 C)-98.9 F (37.2 C)] 98 F (36.7 C) (04/10 0741) Pulse Rate:  [87-96] 91 (04/10 0741) Resp:  [16-18] 18 (04/10 0741) BP: (147-159)/(78-82) 157/82 (04/10 0741) SpO2:  [93 %-96 %] 94 % (04/10 0741) Last BM Date : 12/05/22  Intake/Output from previous day: 04/09 0701 - 04/10 0700 In: 793.6 [I.V.:793.6] Out: -  Intake/Output this shift: No intake/output data recorded.  PE: General: pleasant, WD, female who is laying in bed in NAD Heart: regular, rate, and rhythm.  Lungs: Respiratory effort nonlabored on room air Abd: soft, +BS, mild distension - improved from yesterday. mild TTP greatest over incision which is clean and intact with widely spaced staples. No erythema MSK: all 4 extremities are symmetrical with no cyanosis, clubbing, or edema. No calf TTP Skin: warm and dry Psych: A&Ox3 with an appropriate affect.    Lab Results:  Recent Labs    12/09/22 0554 12/10/22 0458  WBC 8.2 7.7  HGB 12.6 12.0  HCT 37.5 35.5*  PLT 241 234    BMET Recent Labs    12/09/22 0554 12/10/22 0458  NA 133* 132*  K 3.8 3.6  CL 98 97*  CO2 26 22  GLUCOSE 116* 96  BUN 18 19  CREATININE 0.50 0.58  CALCIUM 8.0* 8.1*    PT/INR No results for input(s): "LABPROT", "INR" in the last 72 hours. CMP     Component Value Date/Time   NA 132 (L) 12/10/2022 0458   K 3.6 12/10/2022 0458   CL 97 (L) 12/10/2022 0458   CO2 22 12/10/2022 0458   GLUCOSE 96 12/10/2022 0458   BUN 19 12/10/2022 0458   CREATININE 0.58 12/10/2022 0458   CALCIUM 8.1 (L) 12/10/2022 0458   PROT 8.2 (H) 12/07/2022 0340   ALBUMIN 5.1 (H) 12/07/2022 0340   AST 23 12/07/2022 0340   ALT 16 12/07/2022 0340   ALKPHOS 61  12/07/2022 0340   BILITOT 1.1 12/07/2022 0340   GFRNONAA >60 12/10/2022 0458   GFRAA >60 03/04/2018 0333   Lipase     Component Value Date/Time   LIPASE 50 12/07/2022 0340       Studies/Results: No results found.  Anti-infectives: Anti-infectives (From admission, onward)    Start     Dose/Rate Route Frequency Ordered Stop   12/07/22 1000  ceFAZolin (ANCEF) IVPB 2g/100 mL premix        2 g 200 mL/hr over 30 Minutes Intravenous To [facility] 12/07/22 0859 12/07/22 1224        Assessment/Plan [name] - POD3 s/p open R colectomy 4/7 Dr. Dossie Der - initial imaging concerning for SBO but OR findings as above - surgical path pending - having bowel function but still with some nausea. CLD this am and possibly advance today - encouraged OOB. PT ordered. Ordered IS  FEN: cld, breeze, LR @ 100 ml/hr ID: ancef periop VTE: lovenox Foley: periop, TOV 4/8, voiding  Hyponatremia - chronic, cont IVF Underweight - BMI 17.72. resume diet when able CAD Thyroid disease - home med HLD - home med HTN - home med  Dispo: lives in independent living at [place]. There are rehab options there if needed. PT reccs RW and no follow up  LOS: 3 days   Eric Form, [facility] Surgery 12/10/2022, 9:13 AM Please see Amion for pager number during day hours 7:00am-4:30pm

## 2022-12-11 ENCOUNTER — Inpatient Hospital Stay (HOSPITAL_COMMUNITY): Payer: Medicare Other

## 2022-12-11 LAB — CBC
HCT: 33.1 % — ABNORMAL LOW (ref 36.0–46.0)
Hemoglobin: 11.6 g/dL — ABNORMAL LOW (ref 12.0–15.0)
MCH: 33.4 pg (ref 26.0–34.0)
MCHC: 35 g/dL (ref 30.0–36.0)
MCV: 95.4 fL (ref 80.0–100.0)
Platelets: 233 10*3/uL (ref 150–400)
RBC: 3.47 MIL/uL — ABNORMAL LOW (ref 3.87–5.11)
RDW: 12.3 % (ref 11.5–15.5)
WBC: 7 10*3/uL (ref 4.0–10.5)
nRBC: 0 % (ref 0.0–0.2)

## 2022-12-11 LAB — URINALYSIS, ROUTINE W REFLEX MICROSCOPIC
Bilirubin Urine: NEGATIVE
Glucose, UA: NEGATIVE mg/dL
Ketones, ur: 20 mg/dL — AB
Leukocytes,Ua: NEGATIVE
Nitrite: POSITIVE — AB
Protein, ur: NEGATIVE mg/dL
Specific Gravity, Urine: 1.014 (ref 1.005–1.030)
pH: 6 (ref 5.0–8.0)

## 2022-12-11 LAB — BASIC METABOLIC PANEL
Anion gap: 8 (ref 5–15)
BUN: 12 mg/dL (ref 8–23)
CO2: 24 mmol/L (ref 22–32)
Calcium: 7.7 mg/dL — ABNORMAL LOW (ref 8.9–10.3)
Chloride: 97 mmol/L — ABNORMAL LOW (ref 98–111)
Creatinine, Ser: 0.45 mg/dL (ref 0.44–1.00)
GFR, Estimated: >60 mL/min (ref >60)
Glucose, Bld: 110 mg/dL — ABNORMAL HIGH (ref 70–99)
Potassium: 3.1 mmol/L — ABNORMAL LOW (ref 3.5–5.1)
Sodium: 129 mmol/L — ABNORMAL LOW (ref 135–145)

## 2022-12-11 LAB — MAGNESIUM: Magnesium: 1.5 mg/dL — ABNORMAL LOW (ref 1.7–2.4)

## 2022-12-11 LAB — SURGICAL PATHOLOGY

## 2022-12-11 MED ORDER — DOCUSATE SODIUM 100 MG PO CAPS
100.0000 mg | ORAL_CAPSULE | Freq: Two times a day (BID) | ORAL | Status: DC
  Administered 2022-12-11 – 2022-12-18 (×4): 100 mg via ORAL
  Filled 2022-12-11 (×12): qty 1

## 2022-12-11 MED ORDER — ENSURE ENLIVE PO LIQD
237.0000 mL | Freq: Three times a day (TID) | ORAL | Status: DC
  Administered 2022-12-11 – 2022-12-16 (×7): 237 mL via ORAL

## 2022-12-11 MED ORDER — SULFAMETHOXAZOLE-TRIMETHOPRIM 800-160 MG PO TABS
1.0000 | ORAL_TABLET | Freq: Two times a day (BID) | ORAL | Status: DC
  Administered 2022-12-11 – 2022-12-12 (×2): 1 via ORAL
  Filled 2022-12-11 (×2): qty 1

## 2022-12-11 MED ORDER — SODIUM CHLORIDE 1 G PO TABS
1.0000 g | ORAL_TABLET | Freq: Two times a day (BID) | ORAL | Status: DC
  Administered 2022-12-12: 1 g via ORAL
  Filled 2022-12-11 (×2): qty 1

## 2022-12-11 MED ORDER — POLYETHYLENE GLYCOL 3350 17 G PO PACK: 17.0000 g | PACK | Freq: Every day | ORAL | Status: DC | PRN

## 2022-12-11 MED ORDER — POTASSIUM CHLORIDE 20 MEQ PO PACK
40.0000 meq | PACK | Freq: Two times a day (BID) | ORAL | Status: AC
  Administered 2022-12-11 (×2): 40 meq via ORAL
  Filled 2022-12-11 (×2): qty 2

## 2022-12-11 NOTE — Progress Notes (Signed)
Physical Therapy Treatment Patient Details Name: Deborah [name]. [place]field MRN: [id number] DOB: 03/13/1946 Today's Date: 12/11/2022   History of Present Illness 24 female presents to ED on 4/7 with abdominal pain, n/v. CT abd/pelvis shows severe SBO, the appearance of which suggest a probable internal hernia. S/p open R colectomy on 4/7. PMH includes CAD s/p CABG, HLD, HTN, grave's disease, cancer, bilat RTC repair.    PT Comments    Pt reporting severe nausea, states she has been vomiting some today. Pt agreeable to OOB with encouragement, though pt with limited tolerance given bouts of emesis x2. RN informed of foul-smelling emesis x12 fl oz, pt also stating she had a large, watery BM prior to PT arrival. Pt mobilizing at supervision level at this time, anticipate continued progress with mobility once n/v are improved.      Recommendations for follow up therapy are one component of a multi-disciplinary discharge planning process, led by the attending physician.  Recommendations may be updated based on patient status, additional functional criteria and insurance authorization.  Follow Up Recommendations       Assistance Recommended at Discharge Intermittent Supervision/Assistance  Patient can return home with the following A little help with walking and/or transfers;A little help with bathing/dressing/bathroom   Equipment Recommendations  Rolling walker (2 wheels)    Recommendations for Other Services       Precautions / Restrictions Precautions Precautions: Other (comment);Fall Precaution Comments: abdominal Restrictions Weight Bearing Restrictions: No     Mobility  Bed Mobility Overal bed mobility: Needs Assistance Bed Mobility: Rolling, Sidelying to Sit Rolling: Supervision Sidelying to sit: Supervision       General bed mobility comments: for safety, use of bedrails and increased time    Transfers Overall transfer level: Needs assistance Equipment used: Rolling walker (2  wheels) Transfers: Sit to/from Stand Sit to Stand: Supervision                Ambulation/Gait Ambulation/Gait assistance: Supervision Gait Distance (Feet): 15 Feet Assistive device: None Gait Pattern/deviations: Step-through pattern, Decreased stride length, Trunk flexed Gait velocity: decr     General Gait Details: cues for upright posture, pt pausing for emesis x2   Stairs             Wheelchair Mobility    Modified Rankin (Stroke Patients Only)       Balance Overall balance assessment: Mild deficits observed, not formally tested Sitting-balance support: Feet supported, No upper extremity supported Sitting balance-Leahy Scale: Good     Standing balance support: No upper extremity supported Standing balance-Leahy Scale: Fair                              Cognition Arousal/Alertness: Awake/alert Behavior During Therapy: WFL for tasks assessed/performed Overall Cognitive Status: Within Functional Limits for tasks assessed                                 General Comments: nausous, fatigued        Exercises      General Comments General comments (skin integrity, edema, etc.): RN informed of foul-smelling emesis x12 fl oz, pt also stating she had a large, watery BM prior to PT arrival      Pertinent Vitals/Pain Pain Assessment Pain Assessment: Faces Faces Pain Scale: Hurts even more Pain Location: abdomen Pain Descriptors / Indicators: Tender, Sore Pain Intervention(s): Limited activity within patient's tolerance,  Monitored during session, Repositioned    Home Living                          Prior Function            PT Goals (current goals can now be found in the care plan section) Acute Rehab PT Goals Patient Stated Goal: home PT Goal Formulation: With patient Time For Goal Achievement: 12/22/22 Potential to Achieve Goals: Good Progress towards PT goals: Progressing toward goals    Frequency     Min 3X/week      PT Plan Current plan remains appropriate;Other (comment) (n/v, bm)    Co-evaluation              AM-PAC PT "6 Clicks" Mobility   Outcome Measure  Help needed turning from your back to your side while in a flat bed without using bedrails?: None Help needed moving from lying on your back to sitting on the side of a flat bed without using bedrails?: A Little Help needed moving to and from a bed to a chair (including a wheelchair)?: A Little Help needed standing up from a chair using your arms (e.g., wheelchair or bedside chair)?: A Little Help needed to walk in hospital room?: A Little Help needed climbing 3-5 steps with a railing? : A Little 6 Click Score: 19    End of Session   Activity Tolerance: Patient tolerated treatment well Patient left: with call bell/phone within reach;in bed Nurse Communication: Mobility status PT Visit Diagnosis: Other abnormalities of gait and mobility (R26.89)     Time: 2800-3491 PT Time Calculation (min) (ACUTE ONLY): 13 min  Charges:  $Therapeutic Activity: 8-22 mins                     Marye Round, PT DPT Acute Rehabilitation Services Pager [phone]  Office [phone]    Tyrone Apple E Christain [place] 12/11/2022, 3:17 PM

## 2022-12-11 NOTE — Progress Notes (Signed)
Progress Note  4 Days Post-Op  Subjective: Has not passed much flatus or another bm since yesterday morning. Only sipped on clears and finished maybe 1/4 of breeze. She thinks poor PO intake is probably both nausea and low appetite. She does not eat much at baseline. Nausea is the same as yesterday without emesis. She is ambulating. She is having urinary urgency and urgency incontinence which she does not have at baseline  Friend at bedside  Objective: Vital signs in last 24 hours: Temp:  [98.5 F (36.9 C)-98.7 F (37.1 C)] 98.7 F (37.1 C) (04/11 0340) Pulse Rate:  [90-98] 98 (04/11 0340) Resp:  [13-18] 16 (04/11 0340) BP: (146-156)/(71-84) 156/82 (04/11 0340) SpO2:  [91 %-94 %] 91 % (04/11 0340) Last BM Date :  (prior to surgery but passing gas)  Intake/Output from previous day: No intake/output data recorded. Intake/Output this shift: No intake/output data recorded.  PE: General: pleasant, WD, female who is laying in bed in NAD Heart: regular, rate, and rhythm.  Lungs: Respiratory effort nonlabored on room air Abd: soft, +BS, mild distension - stable to slightly worse from yesterday. mild TTP greatest over incision which is clean and intact with widely spaced staples. No erythema MSK: all 4 extremities are symmetrical with no cyanosis, clubbing, or edema. No calf TTP Skin: warm and dry Psych: A&Ox3 with an appropriate affect.    Lab Results:  Recent Labs    12/10/22 0458 12/11/22 0356  WBC 7.7 7.0  HGB 12.0 11.6*  HCT 35.5* 33.1*  PLT 234 233    BMET Recent Labs    12/10/22 0458 12/11/22 0356  NA 132* 129*  K 3.6 3.1*  CL 97* 97*  CO2 22 24  GLUCOSE 96 110*  BUN 19 12  CREATININE 0.58 0.45  CALCIUM 8.1* 7.7*    PT/INR No results for input(s): "LABPROT", "INR" in the last 72 hours. CMP     Component Value Date/Time   NA 129 (L) 12/11/2022 0356   K 3.1 (L) 12/11/2022 0356   CL 97 (L) 12/11/2022 0356   CO2 24 12/11/2022 0356   GLUCOSE 110 (H)  12/11/2022 0356   BUN 12 12/11/2022 0356   CREATININE 0.45 12/11/2022 0356   CALCIUM 7.7 (L) 12/11/2022 0356   PROT 8.2 (H) 12/07/2022 0340   ALBUMIN 5.1 (H) 12/07/2022 0340   AST 23 12/07/2022 0340   ALT 16 12/07/2022 0340   ALKPHOS 61 12/07/2022 0340   BILITOT 1.1 12/07/2022 0340   GFRNONAA >60 12/11/2022 0356   GFRAA >60 03/04/2018 0333   Lipase     Component Value Date/Time   LIPASE 50 12/07/2022 0340       Studies/Results: No results found.  Anti-infectives: Anti-infectives (From admission, onward)    Start     Dose/Rate Route Frequency Ordered Stop   12/07/22 1000  ceFAZolin (ANCEF) IVPB 2g/100 mL premix        2 g 200 mL/hr over 30 Minutes Intravenous To [facility] Surgical 12/07/22 0859 12/07/22 1224        Assessment/Plan [name] - POD4 s/p open R colectomy 4/7 Dr. Dossie Der - initial imaging concerning for SBO but OR findings as above - surgical path pending - having some BF. Ongoing nausea and poor PO intake. Advance to FLD/ensure but encouraged her to go slow.  Daily colace - will check UA for urinary incontinence  - encouraged OOB. PT ordered. IS  FEN: FLE, ensure, LR @ 100 ml/hr, PO K for hypokalemia ID: ancef  periop VTE: lovenox Foley: periop, TOV 4/8, voiding  Hyponatremia - chronic, cont IVF. Salt tabs today Underweight - BMI 17.72. resume diet when able CAD Thyroid disease - home med HLD - home med HTN - home med  Dispo: lives in independent living at [place]. There are rehab options there if needed. PT reccs RW and no follow up    LOS: 4 days   Eric Form, [facility] Surgery 12/11/2022, 7:44 AM Please see Amion for pager number during day hours 7:00am-4:30pm

## 2022-12-12 ENCOUNTER — Other Ambulatory Visit: Payer: Self-pay

## 2022-12-12 LAB — CBC
HCT: 38.1 % (ref 36.0–46.0)
Hemoglobin: 13 g/dL (ref 12.0–15.0)
MCH: 32.9 pg (ref 26.0–34.0)
MCHC: 34.1 g/dL (ref 30.0–36.0)
MCV: 96.5 fL (ref 80.0–100.0)
Platelets: 308 10*3/uL (ref 150–400)
RBC: 3.95 MIL/uL (ref 3.87–5.11)
RDW: 12.2 % (ref 11.5–15.5)
WBC: 10 10*3/uL (ref 4.0–10.5)
nRBC: 0 % (ref 0.0–0.2)

## 2022-12-12 LAB — PHOSPHORUS: Phosphorus: 3.2 mg/dL (ref 2.5–4.6)

## 2022-12-12 LAB — BASIC METABOLIC PANEL
Anion gap: 13 (ref 5–15)
BUN: 14 mg/dL (ref 8–23)
CO2: 23 mmol/L (ref 22–32)
Calcium: 8.3 mg/dL — ABNORMAL LOW (ref 8.9–10.3)
Chloride: 94 mmol/L — ABNORMAL LOW (ref 98–111)
Creatinine, Ser: 0.55 mg/dL (ref 0.44–1.00)
GFR, Estimated: >60 mL/min (ref >60)
Glucose, Bld: 87 mg/dL (ref 70–99)
Potassium: 3.4 mmol/L — ABNORMAL LOW (ref 3.5–5.1)
Sodium: 130 mmol/L — ABNORMAL LOW (ref 135–145)

## 2022-12-12 LAB — MAGNESIUM: Magnesium: 1.6 mg/dL — ABNORMAL LOW (ref 1.7–2.4)

## 2022-12-12 LAB — GLUCOSE, CAPILLARY: Glucose-Capillary: 113 mg/dL — ABNORMAL HIGH (ref 70–99)

## 2022-12-12 MED ORDER — INSULIN ASPART 100 UNIT/ML IJ SOLN: 0.0000 [IU] | Freq: Four times a day (QID) | INTRAMUSCULAR | Status: DC

## 2022-12-12 MED ORDER — CHLORHEXIDINE GLUCONATE CLOTH 2 % EX PADS
6.0000 | MEDICATED_PAD | Freq: Every day | CUTANEOUS | Status: DC
  Administered 2022-12-12 – 2022-12-17 (×6): 6 via TOPICAL

## 2022-12-12 MED ORDER — POTASSIUM CHLORIDE 10 MEQ/100ML IV SOLN
10.0000 meq | INTRAVENOUS | Status: AC
  Administered 2022-12-12 (×2): 10 meq via INTRAVENOUS
  Filled 2022-12-12 (×2): qty 100

## 2022-12-12 MED ORDER — TRAVASOL 10 % IV SOLN
INTRAVENOUS | Status: AC
  Filled 2022-12-12: qty 350.6

## 2022-12-12 MED ORDER — SODIUM CHLORIDE 0.9% FLUSH: 10.0000 mL | INTRAVENOUS | Status: DC | PRN

## 2022-12-12 MED ORDER — SODIUM CHLORIDE 0.9% FLUSH
10.0000 mL | Freq: Two times a day (BID) | INTRAVENOUS | Status: DC
  Administered 2022-12-12: 40 mL
  Administered 2022-12-13 – 2022-12-17 (×8): 10 mL

## 2022-12-12 MED ORDER — SULFAMETHOXAZOLE-TRIMETHOPRIM 800-160 MG PO TABS
1.0000 | ORAL_TABLET | Freq: Two times a day (BID) | ORAL | Status: DC
  Administered 2022-12-12 – 2022-12-14 (×5): 1 via ORAL
  Filled 2022-12-12 (×6): qty 1

## 2022-12-12 MED ORDER — MAGNESIUM SULFATE 2 GM/50ML IV SOLN
2.0000 g | Freq: Once | INTRAVENOUS | Status: AC
  Administered 2022-12-12: 2 g via INTRAVENOUS
  Filled 2022-12-12 (×2): qty 50

## 2022-12-12 MED ORDER — LACTATED RINGERS IV SOLN: INTRAVENOUS | Status: AC

## 2022-12-12 NOTE — TOC Progression Note (Signed)
Transition of Care (TOC) - Progression Note    Patient Details  Name: Deborah [name]. Kaczanowski MRN: [id number] Date of Birth: [birth year]  Transition of Care [facility]) CM/SW Contact  Erin Sons, [place] Phone Number: 12/12/2022, 10:31 AM  Clinical Narrative:     CSW received call from [facility]. Pt is from their ILF. CSW provided update and explained current PT rec is for [facility]. [facility] has SNF available for their residents if needed. TOC will continue to follow.   Expected Discharge Plan: OP Rehab Barriers to Discharge: Continued Medical Work up  Expected Discharge Plan and Services       Living arrangements for the past 2 months: Independent Living Facility                                       Social Determinants of Health (SDOH) Interventions SDOH Screenings   Food Insecurity: No Food Insecurity (12/07/2022)  Housing: Low Risk  (12/07/2022)  Transportation Needs: No Transportation Needs (12/07/2022)  Utilities: Not At Risk (12/07/2022)  Depression (PHQ2-9): Low Risk  (06/24/2019)  Financial Resource Strain: Low Risk  (05/04/2018)  Physical Activity: Sufficiently Active (05/04/2018)  Stress: Stress Concern Present (05/04/2018)  Tobacco Use: Medium Risk (12/08/2022)    Readmission Risk Interventions     No data to display

## 2022-12-12 NOTE — Progress Notes (Addendum)
Progress Note  5 Days Post-Op  Subjective: Several significant loose bowel movements overnight. Had nausea and emesis x2 yesterday and once overnight. Feeling less nauseas this am. Same to a little less distended. Incontinence better  Friends and family at bedside  Objective: Vital signs in last 24 hours: Temp:  [98.3 F (36.8 C)-98.8 F (37.1 C)] 98.7 F (37.1 C) (04/12 0747) Pulse Rate:  [86-106] 97 (04/12 0747) Resp:  [16-19] 19 (04/12 0747) BP: (139-181)/(77-99) 149/87 (04/12 0747) SpO2:  [93 %-96 %] 94 % (04/12 0747) Last BM Date : 12/11/22  Intake/Output from previous day: 04/11 0701 - 04/12 0700 In: 2494 [P.O.:120; I.V.:2374] Out: 2 [Emesis/NG output:1; Stool:1] Intake/Output this shift: No intake/output data recorded.  PE: General: pleasant, WD, female who is laying in bed in NAD Heart: regular, rate, and rhythm.  Lungs: Respiratory effort nonlabored on room air Abd: soft, +BS, mild distension stable from yesterday. mild TTP greatest over incision which has purulence tinged blood drainage from portion near umbilicus. Staples removed with some bloody purulent drainage. Wound base intact. See below MSK: all 4 extremities are symmetrical with no cyanosis, clubbing, or edema. No calf TTP Skin: warm and dry Psych: A&Ox3 with an appropriate affect.     Lab Results:  Recent Labs    12/11/22 0356 12/12/22 0432  WBC 7.0 10.0  HGB 11.6* 13.0  HCT 33.1* 38.1  PLT 233 308    BMET Recent Labs    12/11/22 0356 12/12/22 0432  NA 129* 130*  K 3.1* 3.4*  CL 97* 94*  CO2 24 23  GLUCOSE 110* 87  BUN 12 14  CREATININE 0.45 0.55  CALCIUM 7.7* 8.3*    PT/INR No results for input(s): "LABPROT", "INR" in the last 72 hours. CMP     Component Value Date/Time   NA 130 (L) 12/12/2022 0432   K 3.4 (L) 12/12/2022 0432   CL 94 (L) 12/12/2022 0432   CO2 23 12/12/2022 0432   GLUCOSE 87 12/12/2022 0432   BUN 14 12/12/2022 0432   CREATININE 0.55 12/12/2022 0432    CALCIUM 8.3 (L) 12/12/2022 0432   PROT 8.2 (H) 12/07/2022 0340   ALBUMIN 5.1 (H) 12/07/2022 0340   AST 23 12/07/2022 0340   ALT 16 12/07/2022 0340   ALKPHOS 61 12/07/2022 0340   BILITOT 1.1 12/07/2022 0340   GFRNONAA >60 12/12/2022 0432   GFRAA >60 03/04/2018 0333   Lipase     Component Value Date/Time   LIPASE 50 12/07/2022 0340       Studies/Results: DG Abd Portable 1V  Result Date: 12/11/2022 CLINICAL DATA:  Abdominal distension, recent right hemicolectomy EXAM: PORTABLE ABDOMEN - 1 VIEW COMPARISON:  12/07/2022 FINDINGS: The dilation of small-bowel loops measuring up to 4.6 cm in diameter. Small amount of gas is present in colon. Stomach is not distended. Skin staples are seen. Dextroscoliosis is seen in lumbar spine. Degenerative changes are noted in lumbar spine. IMPRESSION: There is dilation of small-bowel loops measuring up to 4.6 cm in diameter suggesting ileus or partial small bowel obstruction. Electronically Signed   By: Ernie Avena M.D.   On: 12/11/2022 10:17    Anti-infectives: Anti-infectives (From admission, onward)    Start     Dose/Rate Route Frequency Ordered Stop   12/11/22 2200  sulfamethoxazole-trimethoprim (BACTRIM DS) 800-160 MG per tablet 1 tablet        1 tablet Oral Every 12 hours 12/11/22 1609 12/14/22 0959   12/07/22 1000  ceFAZolin (ANCEF) IVPB 2g/100 mL  premix        2 g 200 mL/hr over 30 Minutes Intravenous To [facility] Surgical 12/07/22 0859 12/07/22 1224        Assessment/Plan [name] - POD5 s/p open R colectomy 4/7 Dr. Dossie Der - initial imaging concerning for SBO but OR findings as above - surgical path negative for malignancy - some bowel function. Xray yesterday with distentiosn and distended on exam this am. Okay for sips of clears. - due to prolonged poor po intake have ordered PICC/TPN - drainage from wound today and all but 2 staples removed. Wound shallow with base intact. Bid WTD dressing changes. Abd binder.  On bactrim for UTI - abdominal binder ordered  - encouraged OOB. PT ordered. IS  UTI - UA concerning for UTI. UCX pending. Bactrim start 4/11  FEN: sips of clears, LR @ 100 ml/hr, IV K for hypokalemia ID: ancef periop, bactrim 4/11>4/15 VTE: lovenox Foley: periop, TOV 4/8, voiding  Hyponatremia - chronic, cont IVF. Salt tabs today Underweight - BMI 17.72. resume diet when able CAD Thyroid disease - home med HLD - home med HTN - home med  Dispo: lives in independent living at [place]. There are rehab options there if needed. PT reccs RW and no follow up    LOS: 5 days   Eric Form, [facility] Surgery 12/12/2022, 8:04 AM Please see Amion for pager number during day hours 7:00am-4:30pm

## 2022-12-12 NOTE — Progress Notes (Signed)
Initial Nutrition Assessment  DOCUMENTATION CODES:   Severe malnutrition in context of social or environmental circumstances, Underweight  INTERVENTION:  Start TPN- titrate to goal slowly - patient at high risk for refeeding  -replete electrolytes as necessary   Communicate with RPH about macro needs  Education about TPN  Education on adequate nutrition and increased needs  Weekly weight  NUTRITION DIAGNOSIS:   Severe Malnutrition related to social / environmental circumstances as evidenced by severe fat depletion, severe muscle depletion.  GOAL:   Patient will meet greater than or equal to 90% of their needs  MONITOR:   I & O's, Labs, Weight trends, Diet advancement  REASON FOR ASSESSMENT:   Consult New TPN/TNA  ASSESSMENT:  77 y.o. female with PMHx including CAD s/p CABG, HLD, HTN, grave's disease, cancer, bilat RTC repair admitted due to abdominal pain related to small bowel obstruction with concern for internal hernia or closed loop obstruction.  Poor po and nausea  S/p open right colectomy 4/7  Passing a little flatus, had a bm yesterday Labs: Na 130, K+ 3.4, mag 1.5 Meds: colace, protonix, pravachol, salt tab, bactrim ds, lactated ringers @50  ml/hr  Wt: stable wt  PO: NPO  I/O's:  +7.4 L   Visited patient at bedside who reports her last meal was last Thurs, 4/4 (spinach salad and steak). She required emergent surgery that Sunday. Patient is not a big eater at baseline.   Interview was concluded prematurely due to IV nurse reporting to place PICC line.   NUTRITION - FOCUSED PHYSICAL EXAM:  Flowsheet Row Most Recent Value  Orbital Region Severe depletion  Upper Arm Region Severe depletion  Thoracic and Lumbar Region Unable to assess  Buccal Region Severe depletion  Temple Region Moderate depletion  Clavicle Bone Region Severe depletion  Clavicle and Acromion Bone Region Severe depletion  Scapular Bone Region Unable to assess  Dorsal Hand Severe  depletion  Patellar Region Severe depletion  Anterior Thigh Region Severe depletion  Posterior Calf Region Severe depletion  Edema (RD Assessment) Mild  Hair Reviewed  Eyes Reviewed  Mouth Reviewed  Skin Reviewed  Nails Reviewed       Diet Order:   Diet Order             Diet NPO time specified Except for: Sips with Meds, Ice Chips  Diet effective now                   EDUCATION NEEDS:   Education needs have been addressed  Skin:  Skin Assessment: Skin Integrity Issues: Skin Integrity Issues:: Incisions Incisions: abdomen  Last BM:  4/12  Height:   Ht Readings from Last 1 Encounters:  12/07/22 5' 2.99" (1.6 m)    Weight:   Wt Readings from Last 1 Encounters:  12/07/22 45.4 kg    BMI:  Body mass index is 17.72 kg/m.  Estimated Nutritional Needs:   Kcal:  1300-1600 kcal  Protein:  70-80 g  Fluid:  >/= 1.5 L   Leodis Rains, RDN, LDN  Clinical Nutrition

## 2022-12-12 NOTE — Plan of Care (Signed)

## 2022-12-12 NOTE — Progress Notes (Signed)
Mobility Specialist - Progress Note   12/12/22 1055  Mobility  Activity Ambulated independently in hallway  Level of Assistance Independent  Assistive Device Other (Comment) (Iv Pole)  Distance Ambulated (ft) 300 ft  Activity Response Tolerated well  Mobility Referral Yes  $Mobility charge 1 Mobility   Pt was received in hallway ambulating with family. Pt expressed feeling much better than yesterday. No complaints throughout session. Pt was left ambulating halls with family and all needs met.   Deborah [name]  Mobility Specialist Please contact via Special educational needs teacher or Rehab office at [phone]

## 2022-12-12 NOTE — Progress Notes (Addendum)
PHARMACY - TOTAL PARENTERAL NUTRITION CONSULT NOTE   Indication: Prolonged ileus  Patient Measurements: Height: 5' 2.99" (160 cm) Weight: 45.4 kg (100 lb) IBW/kg (Calculated) : 52.38 TPN AdjBW (KG): 45.4 Body mass index is 17.72 kg/m. Usual Weight: approx. 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 Patient underwent a R colectomy on 4/7. Patient with BM on 4/11 but imaging on 4/11 showing potential ileus or partial SBO. Patient with low PO intake at baseline, in past several days patient has had poor PO intake reporting finishing around 1/4 of a breeze now NPO. Intake limited by nausea. Pharmacy consulted for TPN management.   Patient reports typically eating 2 meals a day PTA. Last full meal that she can remember was on 4/4 prior to the onset of abdominal pain. She normally will eat lunch and dinner. Lunch typically is a 1/2 a sandwich with some type of protein. Dinner common a small protein (steak) with some vegetables (salad). Patient reports her appetite is historically low, no recent weight gain/loss. She says she is typically around 100lbs.   Glucose / Insulin: no hx of DM, CBG < 180, no SSI ordered  Electrolytes: K: 3.4, Na: 130, Cl: 94, HCO3: 23, Mag: 1.6, Phos: 3.2  Goal K > 4 and Mag > 2 for ileus  Renal: Scr < 1, BUN: 14  Hepatic: 4/6 LFTS WNL, albumin: 5.1, alk phos/T-bili WNL, will get TG tomorrow  Intake / Output: MIVF- LR @ 33mL/hr, PO intake, 70mL documented NG output, 1x stool, emesis, urine occurrence charted  GI Imaging: 4/11 KUB: potential ileus or SBO  GI Surgeries / Procedures:  4/7: open R colectomy  4/7 CT abdomen: severe SBO   Central access: 4/12  TPN start date: 4/12   Nutritional Goals: Goal TPN rate is 60 mL/hr (provides 70 g of protein and 1352 kcals per day)  RD Assessment:  70-80 g of protein  1300-1600kcal per day  per discussion with RD   Current Nutrition:  NPO TPN   Plan:  Start  TPN at half rate 34mL/hr at 1800, will provide 30g AA and 675 kcal per day.  Electrolytes in TPN: Na 34mEq/L, K 35mEq/L, Ca 59mEq/L, Mg 5mEq/L, and Phos 70mmol/L. Cl:Ac 2:1 20 mEq Kcl IV ordered per primary team for replacement.  Will order 2g IV mag for supplementation.  Add standard MVI and trace elements to TPN Initiate very Sensitive q6h SSI and adjust as needed  Will add 100mg  of thiamine x 7 days per RD request due to risk of refeeding.  Reduce MIVF to 20 mL/hr at 1800 Monitor TPN labs on Mon/Thurs, daily until stabilization   Estill Batten, PharmD, BCCCP  12/12/2022,9:07 AM

## 2022-12-12 NOTE — Progress Notes (Signed)
Peripherally Inserted Central Catheter Placement  The IV Nurse has discussed with the patient and/or persons authorized to consent for the patient, the purpose of this procedure and the potential benefits and risks involved with this procedure.  The benefits include less needle sticks, lab draws from the catheter, and the patient may be discharged home with the catheter. Risks include, but not limited to, infection, bleeding, blood clot (thrombus formation), and puncture of an artery; nerve damage and irregular heartbeat and possibility to perform a PICC exchange if needed/ordered by physician.  Alternatives to this procedure were also discussed.  Bard Power PICC patient education guide, fact sheet on infection prevention and patient information card has been provided to patient /or left at bedside.    PICC Placement Documentation  PICC Double Lumen 12/12/22 Right Basilic 33 cm 0 cm (Active)  Indication for Insertion or Continuance of Line Administration of hyperosmolar/irritating solutions (i.e. TPN, Vancomycin, etc.) 12/12/22 1221  Exposed Catheter (cm) 0 cm 12/12/22 1221  Site Assessment Clean, Dry, Intact 12/12/22 1221  Lumen #1 Status Flushed;Saline locked;Blood return noted 12/12/22 1221  Lumen #2 Status Flushed;Saline locked;Blood return noted 12/12/22 1221  Dressing Type Transparent;Securing device 12/12/22 1221  Dressing Status Antimicrobial disc in place 12/12/22 1221  Safety Lock Intact 12/12/22 1221  Line Adjustment (NICU/IV Team Only) No 12/12/22 1221  Dressing Intervention New dressing;Other (Comment) 12/12/22 1221  Dressing Change Due 12/19/22 12/12/22 1221       Reginia Forts Albarece 12/12/2022, 12:24 PM

## 2022-12-13 LAB — COMPREHENSIVE METABOLIC PANEL
ALT: 73 U/L — ABNORMAL HIGH (ref 0–44)
AST: 52 U/L — ABNORMAL HIGH (ref 15–41)
Albumin: 2.7 g/dL — ABNORMAL LOW (ref 3.5–5.0)
Alkaline Phosphatase: 43 U/L (ref 38–126)
Anion gap: 8 (ref 5–15)
BUN: 9 mg/dL (ref 8–23)
CO2: 26 mmol/L (ref 22–32)
Calcium: 7.8 mg/dL — ABNORMAL LOW (ref 8.9–10.3)
Chloride: 96 mmol/L — ABNORMAL LOW (ref 98–111)
Creatinine, Ser: 0.51 mg/dL (ref 0.44–1.00)
GFR, Estimated: >60 mL/min (ref >60)
Glucose, Bld: 131 mg/dL — ABNORMAL HIGH (ref 70–99)
Potassium: 3.6 mmol/L (ref 3.5–5.1)
Sodium: 130 mmol/L — ABNORMAL LOW (ref 135–145)
Total Bilirubin: 0.6 mg/dL (ref 0.3–1.2)
Total Protein: 5.1 g/dL — ABNORMAL LOW (ref 6.5–8.1)

## 2022-12-13 LAB — CBC
HCT: 32.5 % — ABNORMAL LOW (ref 36.0–46.0)
Hemoglobin: 11.6 g/dL — ABNORMAL LOW (ref 12.0–15.0)
MCH: 33.7 pg (ref 26.0–34.0)
MCHC: 35.7 g/dL (ref 30.0–36.0)
MCV: 94.5 fL (ref 80.0–100.0)
Platelets: 313 10*3/uL (ref 150–400)
RBC: 3.44 MIL/uL — ABNORMAL LOW (ref 3.87–5.11)
RDW: 12.3 % (ref 11.5–15.5)
WBC: 9.7 10*3/uL (ref 4.0–10.5)
nRBC: 0 % (ref 0.0–0.2)

## 2022-12-13 LAB — TRIGLYCERIDES: Triglycerides: 94 mg/dL (ref <150)

## 2022-12-13 LAB — GLUCOSE, CAPILLARY
Glucose-Capillary: 119 mg/dL — ABNORMAL HIGH (ref 70–99)
Glucose-Capillary: 123 mg/dL — ABNORMAL HIGH (ref 70–99)
Glucose-Capillary: 125 mg/dL — ABNORMAL HIGH (ref 70–99)
Glucose-Capillary: 147 mg/dL — ABNORMAL HIGH (ref 70–99)
Glucose-Capillary: 168 mg/dL — ABNORMAL HIGH (ref 70–99)

## 2022-12-13 LAB — PHOSPHORUS: Phosphorus: 2.5 mg/dL (ref 2.5–4.6)

## 2022-12-13 LAB — URINE CULTURE

## 2022-12-13 LAB — MAGNESIUM: Magnesium: 1.8 mg/dL (ref 1.7–2.4)

## 2022-12-13 MED ORDER — MAGNESIUM SULFATE 2 GM/50ML IV SOLN
2.0000 g | Freq: Once | INTRAVENOUS | Status: AC
  Administered 2022-12-13: 2 g via INTRAVENOUS
  Filled 2022-12-13: qty 50

## 2022-12-13 MED ORDER — POTASSIUM CHLORIDE 10 MEQ/100ML IV SOLN
10.0000 meq | INTRAVENOUS | Status: AC
  Administered 2022-12-13 (×4): 10 meq via INTRAVENOUS
  Filled 2022-12-13 (×4): qty 100

## 2022-12-13 MED ORDER — SODIUM PHOSPHATES 45 MMOLE/15ML IV SOLN
15.0000 mmol | Freq: Once | INTRAVENOUS | Status: AC
  Administered 2022-12-13: 15 mmol via INTRAVENOUS
  Filled 2022-12-13: qty 5

## 2022-12-13 MED ORDER — TRAVASOL 10 % IV SOLN
INTRAVENOUS | Status: AC
  Filled 2022-12-13: qty 701.3

## 2022-12-13 NOTE — Progress Notes (Signed)
Progress Note  6 Days Post-Op  Subjective: Several significant loose bowel movements overnight. Had nausea and emesis x2 yesterday and once overnight. Feeling less nauseas this am. Same to a little less distended. Incontinence better  Friends and family at bedside  Objective: Vital signs in last 24 hours: Temp:  [97.5 F (36.4 C)-98.3 F (36.8 C)] 98.3 F (36.8 C) (04/13 0839) Pulse Rate:  [90-114] 114 (04/13 0839) Resp:  [18] 18 (04/13 0513) BP: (132-164)/(74-88) 132/83 (04/13 0839) SpO2:  [93 %-96 %] 96 % (04/13 0839) Last BM Date : 12/12/22  Intake/Output from previous day: 04/12 0701 - 04/13 0700 In: 551.4 [I.V.:551.4] Out: -  Intake/Output this shift: No intake/output data recorded.  PE: General: pleasant, Heart: regular, rate, and rhythm.  Lungs: Respiratory effort nonlabored on room air Abd: soft, mild to moderate distension. mild TTP greatest over incision - packing changed - clean, no purulence. Wound base intact. Skin: warm and dry Psych: A&Ox3 with an appropriate affect.    Lab Results:  Recent Labs    12/12/22 0432 12/13/22 0610  WBC 10.0 9.7  HGB 13.0 11.6*  HCT 38.1 32.5*  PLT 308 313   BMET Recent Labs    12/12/22 0432 12/13/22 0610  NA 130* 130*  K 3.4* 3.6  CL 94* 96*  CO2 23 26  GLUCOSE 87 131*  BUN 14 9  CREATININE 0.55 0.51  CALCIUM 8.3* 7.8*   PT/INR No results for input(s): "LABPROT", "INR" in the last 72 hours. CMP     Component Value Date/Time   NA 130 (L) 12/13/2022 0610   K 3.6 12/13/2022 0610   CL 96 (L) 12/13/2022 0610   CO2 26 12/13/2022 0610   GLUCOSE 131 (H) 12/13/2022 0610   BUN 9 12/13/2022 0610   CREATININE 0.51 12/13/2022 0610   CALCIUM 7.8 (L) 12/13/2022 0610   PROT 5.1 (L) 12/13/2022 0610   ALBUMIN 2.7 (L) 12/13/2022 0610   AST 52 (H) 12/13/2022 0610   ALT 73 (H) 12/13/2022 0610   ALKPHOS 43 12/13/2022 0610   BILITOT 0.6 12/13/2022 0610   GFRNONAA >60 12/13/2022 0610   GFRAA >60 03/04/2018 0333    Lipase     Component Value Date/Time   LIPASE 50 12/07/2022 0340       Studies/Results: [place] EKG SITE RITE  Result Date: 12/12/2022 If Site Rite image not attached, placement could not be confirmed due to current cardiac rhythm.   Anti-infectives: Anti-infectives (From admission, onward)    Start     Dose/Rate Route Frequency Ordered Stop   12/12/22 2200  sulfamethoxazole-trimethoprim (BACTRIM DS) 800-160 MG per tablet 1 tablet        1 tablet Oral Every 12 hours 12/12/22 1057 12/15/22 2159   12/11/22 2200  sulfamethoxazole-trimethoprim (BACTRIM DS) 800-160 MG per tablet 1 tablet  Status:  Discontinued        1 tablet Oral Every 12 hours 12/11/22 1609 12/12/22 1057   12/07/22 1000  ceFAZolin (ANCEF) IVPB 2g/100 mL premix        2 g 200 mL/hr over 30 Minutes Intravenous To [facility] 12/07/22 0859 12/07/22 1224        Assessment/Plan [name] - POD6 s/p open R colectomy 4/7 Dr. Dossie Der - initial imaging concerning for SBO but OR findings as above - surgical path negative for malignancy - some bowel function. Xray yesterday with distentiosn and distended on exam this am. Okay for sips of clears. - due to prolonged poor po intake have  ordered PICC/TPN - drainage from wound today and all but 2 staples removed. Wound shallow with base intact. Bid WTD dressing changes. Abd binder. On bactrim for UTI - abdominal binder ordered  - encouraged OOB. PT ordered. IS  UTI - UA concerning for UTI. UCX pending. Bactrim start 4/11  FEN: sips of clears, LR @ 100 ml/hr, IV K for hypokalemia - awaiting resolution of ileus and distention ID: ancef periop, bactrim 4/11>4/15 VTE: lovenox Foley: periop, TOV 4/8, voiding  Hyponatremia - chronic, cont IVF. Salt tabs today Underweight - BMI 17.72. resume diet when able CAD Thyroid disease - home med HLD - home med HTN - home med  Dispo: lives in independent living at [place]. There are rehab options there if  needed. PT reccs RW and no follow up    LOS: 6 days   Andria Meuse, MD

## 2022-12-13 NOTE — Plan of Care (Signed)

## 2022-12-13 NOTE — Progress Notes (Signed)
PHARMACY - TOTAL PARENTERAL NUTRITION CONSULT NOTE   Indication: Prolonged ileus  Patient Measurements: Height: 5' 2.99" (160 cm) Weight: 45.4 kg (100 lb) IBW/kg (Calculated) : 52.38 TPN AdjBW (KG): 45.4 Body mass index is 17.72 kg/m. Usual Weight: approx. 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 Patient underwent a R colectomy on 4/7. Patient with BM on 4/11 but imaging on 4/11 showing potential ileus or partial SBO. Patient with low PO intake at baseline, in past several days patient has had poor PO intake reporting finishing around 1/4 of a breeze now NPO. Intake limited by nausea. Pharmacy consulted for TPN management.   Patient reports typically eating 2 meals a day PTA. Last full meal that she can remember was on 4/4 prior to the onset of abdominal pain. She normally will eat lunch and dinner. Lunch typically is a 1/2 a sandwich with some type of protein. Dinner common a small protein (steak) with some vegetables (salad). Patient reports her appetite is historically low, no recent weight gain/loss. She says she is typically around 100lbs.   Glucose / Insulin: no hx of DM, CBG < 180, on SSI (0 units/24h) Electrolytes: Na: 130, K: 3.6, Cl: 96, Mag: 1.8, Phos: 2.5  Goal K > 4 and Mag > 2 for ileus  Renal: Scr < 1, BUN: 14  Hepatic: AST/ALT: 52/73, albumin: 2.7, alk phos/T-bili WNL, TG: 94 Intake / Output: MIVF- LR @ 56mL/hr, stool: x3 occurences, urine: x4 occurrences GI Imaging: 4/11 KUB: potential ileus or SBO  GI Surgeries / Procedures:  4/7: open R colectomy  4/7 CT abdomen: severe SBO   Central access: 4/12  TPN start date: 4/12   Nutritional Goals: Goal TPN rate is 60 mL/hr (provides 70 g of protein and 1352 kcals per day)  RD Assessment:  70-80 g of protein  1300-1600kcal per day  >/=   Current Nutrition:  NPO TPN   Plan:  Increase TPN to goal rate of 15mL/hr at 1800, to provide 70g of protein and 1352  kcals, 100% of nutritional requirements. Electrolytes in TPN: Na 17mEq/L, K 40mEq/L, Ca 37mEq/L, Mg 75mEq/L, and Phos increased to 18mmol/L. Cl:Ac 2:1 Add standard MVI and trace elements to TPN Thiamine 100mg  x 7 days added to TPN per RD request due to risk of refeeding.  Give NaPhos IV x1, KCl IV x4 runs, Magnesium sulfate 2g IV x1 in addition to TPN. Continue very Sensitive q6h SSI and adjust as needed  Discontinue MIVF at 1800 Monitor TPN labs daily until stable, then every Mon/Thurs    Wilburn Cornelia, PharmD, BCPS Clinical Pharmacist 12/13/2022 9:30 AM   Please refer to [facility] for pharmacy phone number

## 2022-12-14 LAB — COMPREHENSIVE METABOLIC PANEL
ALT: 51 U/L — ABNORMAL HIGH (ref 0–44)
AST: 27 U/L (ref 15–41)
Albumin: 2.5 g/dL — ABNORMAL LOW (ref 3.5–5.0)
Alkaline Phosphatase: 39 U/L (ref 38–126)
Anion gap: 5 (ref 5–15)
BUN: 8 mg/dL (ref 8–23)
CO2: 25 mmol/L (ref 22–32)
Calcium: 7.7 mg/dL — ABNORMAL LOW (ref 8.9–10.3)
Chloride: 100 mmol/L (ref 98–111)
Creatinine, Ser: 0.52 mg/dL (ref 0.44–1.00)
GFR, Estimated: >60 mL/min (ref >60)
Glucose, Bld: 140 mg/dL — ABNORMAL HIGH (ref 70–99)
Potassium: 3.9 mmol/L (ref 3.5–5.1)
Sodium: 130 mmol/L — ABNORMAL LOW (ref 135–145)
Total Bilirubin: 0.4 mg/dL (ref 0.3–1.2)
Total Protein: 4.9 g/dL — ABNORMAL LOW (ref 6.5–8.1)

## 2022-12-14 LAB — GLUCOSE, CAPILLARY
Glucose-Capillary: 149 mg/dL — ABNORMAL HIGH (ref 70–99)
Glucose-Capillary: 131 mg/dL — ABNORMAL HIGH (ref 70–99)
Glucose-Capillary: 120 mg/dL — ABNORMAL HIGH (ref 70–99)

## 2022-12-14 LAB — CBC
HCT: 32.4 % — ABNORMAL LOW (ref 36.0–46.0)
Hemoglobin: 11.1 g/dL — ABNORMAL LOW (ref 12.0–15.0)
MCH: 33.3 pg (ref 26.0–34.0)
MCHC: 34.3 g/dL (ref 30.0–36.0)
MCV: 97.3 fL (ref 80.0–100.0)
Platelets: 353 10*3/uL (ref 150–400)
RBC: 3.33 MIL/uL — ABNORMAL LOW (ref 3.87–5.11)
RDW: 12.5 % (ref 11.5–15.5)
WBC: 9.4 10*3/uL (ref 4.0–10.5)
nRBC: 0 % (ref 0.0–0.2)

## 2022-12-14 LAB — URINE CULTURE: Culture: >=100000 — AB

## 2022-12-14 LAB — MAGNESIUM: Magnesium: 2 mg/dL (ref 1.7–2.4)

## 2022-12-14 LAB — PHOSPHORUS: Phosphorus: 3.5 mg/dL (ref 2.5–4.6)

## 2022-12-14 MED ORDER — TRAVASOL 10 % IV SOLN
INTRAVENOUS | Status: AC
  Filled 2022-12-14: qty 701.3

## 2022-12-14 NOTE — Progress Notes (Signed)
7 Days Post-Op   Subjective/Chief Complaint: Patient has had several more loose BM. No nausea or vomiting Less distended   Objective: Vital signs in last 24 hours: Temp:  [97.6 F (36.4 C)-98.6 F (37 C)] 98.4 F (36.9 C) (04/14 0825) Pulse Rate:  [90-99] 90 (04/14 0825) Resp:  [16-17] 16 (04/14 0825) BP: (140-143)/(72-87) 140/72 (04/14 0825) SpO2:  [94 %-97 %] 95 % (04/14 0825) Last BM Date : 12/13/22  Intake/Output from previous day: 04/13 0701 - 04/14 0700 In: 788.6 [I.V.:488.6; IV Piggyback:300] Out: -  Intake/Output this shift: Total I/O In: 10 [I.V.:10] Out: -   WDWN in NAD Abd - soft, mildly distended; incisional tenderness Midline wound - some drainage on packing.  NS wet to dry dressing replaced; fascia intact  Lab Results:  Recent Labs    12/13/22 0610 12/14/22 0352  WBC 9.7 9.4  HGB 11.6* 11.1*  HCT 32.5* 32.4*  PLT 313 353   BMET Recent Labs    12/13/22 0610 12/14/22 0352  NA 130* 130*  K 3.6 3.9  CL 96* 100  CO2 26 25  GLUCOSE 131* 140*  BUN 9 8  CREATININE 0.51 0.52  CALCIUM 7.8* 7.7*   PT/INR No results for input(s): "LABPROT", "INR" in the last 72 hours. ABG No results for input(s): "PHART", "HCO3" in the last 72 hours.  Invalid input(s): "PCO2", "PO2"  Studies/Results: No results found.  Anti-infectives: Anti-infectives (From admission, onward)    Start     Dose/Rate Route Frequency Ordered Stop   12/12/22 2200  sulfamethoxazole-trimethoprim (BACTRIM DS) 800-160 MG per tablet 1 tablet        1 tablet Oral Every 12 hours 12/12/22 1057 12/15/22 2159   12/11/22 2200  sulfamethoxazole-trimethoprim (BACTRIM DS) 800-160 MG per tablet 1 tablet  Status:  Discontinued        1 tablet Oral Every 12 hours 12/11/22 1609 12/12/22 1057   12/07/22 1000  ceFAZolin (ANCEF) IVPB 2g/100 mL premix        2 g 200 mL/hr over 30 Minutes Intravenous To [facility] 12/07/22 0859 12/07/22 1224       Assessment/Plan: [name] -  POD7 s/p open R colectomy 4/7 Dr. Dossie Der - initial imaging concerning for SBO but OR findings as above - surgical path negative for malignancy - some bowel function. Xray yesterday with distentiosn and distended on exam this am. Okay for sips of clears. - due to prolonged poor po intake have ordered PICC/TPN - drainage from wound today and all but 2 staples removed. Wound shallow with base intact. WTD dressing changes. Abd binder. On bactrim for UTI - abdominal binder ordered  - encouraged OOB. PT ordered. IS   UTI - UA concerning for UTI. UCX pending. Bactrim start 4/11   FEN: Clear liquids, LR @ 100 ml/hr, IV K for hypokalemia - ID: ancef periop, bactrim 4/11>4/15 VTE: lovenox Foley: periop, TOV 4/8, voiding   Hyponatremia - chronic, cont IVF. Salt tabs today Underweight - BMI 17.72. resume diet when able CAD Thyroid disease - home med HLD - home med HTN - home med   Dispo: lives in independent living at [place]. Will likely need HHRN for dressing changes. PT reccs RW and no follow up  Hopefully ADAT and wean TPN over the next few days.  Discharge in 2-4 days.    LOS: 7 days   Deborah [name] 12/14/2022

## 2022-12-14 NOTE — Progress Notes (Signed)
PHARMACY - TOTAL PARENTERAL NUTRITION CONSULT NOTE   Indication: Prolonged ileus  Patient Measurements: Height: 5' 2.99" (160 cm) Weight: 45.4 kg (100 lb) IBW/kg (Calculated) : 52.38 TPN AdjBW (KG): 45.4 Body mass index is 17.72 kg/m. Usual Weight: approx. 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 Patient underwent a R colectomy on 4/7. Patient with BM on 4/11 but imaging on 4/11 showing potential ileus or partial SBO. Patient with low PO intake at baseline, in past several days patient has had poor PO intake reporting finishing around 1/4 of a breeze now NPO. Intake limited by nausea. Pharmacy consulted for TPN management.   Patient reports typically eating 2 meals a day PTA. Last full meal that she can remember was on 4/4 prior to the onset of abdominal pain. She normally will eat lunch and dinner. Lunch typically is a 1/2 a sandwich with some type of protein. Dinner common a small protein (steak) with some vegetables (salad). Patient reports her appetite is historically low, no recent weight gain/loss. She says she is typically around 100lbs.   Glucose / Insulin: no hx of DM, CBG < 180, on SSI (0 units/24h) Electrolytes: Na: 130, K: 3.9, Mag: 2, others WNL Goal K > 4 and Mag > 2 for ileus  Renal: Scr < 1, BUN WNL Hepatic: AST/ALT trending down to 27/51, albumin: 2.5, alk phos/T-bili WNL, TG: 94 (4/13) Intake / Output: output not charted, LBM: 4/13 GI Imaging: 4/11 KUB: potential ileus or SBO  GI Surgeries / Procedures:  4/7: open R colectomy  4/7 CT abdomen: severe SBO   Central access: 4/12  TPN start date: 4/12   Nutritional Goals: Goal TPN rate is 60 mL/hr (provides 70 g of protein and 1352 kcals per day)  RD Assessment:  70-80 g of protein  1300-1600kcal per day  >/=   Current Nutrition:  NPO TPN   Plan:  Continue TPN at goal rate of 73mL/hr at 1800, to provide 70g of protein and 1352 kcals, 100% of nutritional  requirements. Electrolytes in TPN: Na 16mEq/L, K 89mEq/L, Ca 22mEq/L, Mg 59mEq/L, and Phos increased to 68mmol/L. Cl:Ac 2:1 Add standard MVI and trace elements to TPN Thiamine 100mg  x 7 days added to TPN per RD request due to risk of refeeding (remove on 4/19) Continue very Sensitive q6h SSI and adjust as needed - may be able to discontinue on 4/15 Request RN to document output while on TPN Monitor TPN labs daily until stable, then every Mon/Thurs  With return of bowel function, f/u ability to resume enteral nutrition/wean TPN   Wilburn Cornelia, PharmD, BCPS Clinical Pharmacist 12/14/2022 9:30 AM   Please refer to [facility] for pharmacy phone number

## 2022-12-15 DIAGNOSIS — E43 Unspecified severe protein-calorie malnutrition: Secondary | ICD-10-CM | POA: Insufficient documentation

## 2022-12-15 LAB — COMPREHENSIVE METABOLIC PANEL
ALT: 46 U/L — ABNORMAL HIGH (ref 0–44)
AST: 29 U/L (ref 15–41)
Albumin: 2.7 g/dL — ABNORMAL LOW (ref 3.5–5.0)
Alkaline Phosphatase: 38 U/L (ref 38–126)
Anion gap: 10 (ref 5–15)
BUN: 14 mg/dL (ref 8–23)
CO2: 24 mmol/L (ref 22–32)
Calcium: 8.5 mg/dL — ABNORMAL LOW (ref 8.9–10.3)
Chloride: 96 mmol/L — ABNORMAL LOW (ref 98–111)
Creatinine, Ser: 0.48 mg/dL (ref 0.44–1.00)
GFR, Estimated: >60 mL/min (ref >60)
Glucose, Bld: 120 mg/dL — ABNORMAL HIGH (ref 70–99)
Potassium: 4.4 mmol/L (ref 3.5–5.1)
Sodium: 130 mmol/L — ABNORMAL LOW (ref 135–145)
Total Bilirubin: 0.3 mg/dL (ref 0.3–1.2)
Total Protein: 5.4 g/dL — ABNORMAL LOW (ref 6.5–8.1)

## 2022-12-15 LAB — GLUCOSE, CAPILLARY: Glucose-Capillary: 103 mg/dL — ABNORMAL HIGH (ref 70–99)

## 2022-12-15 LAB — TRIGLYCERIDES: Triglycerides: 80 mg/dL (ref <150)

## 2022-12-15 LAB — MAGNESIUM: Magnesium: 1.9 mg/dL (ref 1.7–2.4)

## 2022-12-15 LAB — PHOSPHORUS: Phosphorus: 4.1 mg/dL (ref 2.5–4.6)

## 2022-12-15 MED ORDER — LEVOFLOXACIN 250 MG PO TABS
250.0000 mg | ORAL_TABLET | Freq: Every day | ORAL | Status: AC
  Administered 2022-12-15 – 2022-12-17 (×3): 250 mg via ORAL
  Filled 2022-12-15 (×3): qty 1

## 2022-12-15 MED ORDER — TRAVASOL 10 % IV SOLN
INTRAVENOUS | Status: AC
  Filled 2022-12-15: qty 350.6

## 2022-12-15 NOTE — Progress Notes (Signed)
8 Days Post-Op   Subjective/Chief Complaint: Continues to feel better. More loose stools overnight. Tolerated clears without n/v/abdominal pain. Distension resolved. Pain controlled. Mobilizing. No respiratory complaints. Urinary incontinence improved but was not present PTA   Objective: Vital signs in last 24 hours: Temp:  [98.2 F (36.8 C)-98.4 F (36.9 C)] 98.2 F (36.8 C) (04/15 0842) Pulse Rate:  [94-102] 97 (04/15 0842) Resp:  [16-18] 18 (04/15 0842) BP: (101-125)/(62-78) 125/78 (04/15 0842) SpO2:  [94 %-98 %] 98 % (04/15 0842) Last BM Date : 12/14/22  Intake/Output from previous day: 04/14 0701 - 04/15 0700 In: 10 [I.V.:10] Out: -  Intake/Output this shift: No intake/output data recorded.  WDWN in NAD Abd - soft, mildly distended; incisional tenderness Midline wound - some drainage on packing.  NS wet to dry dressing replaced; fascia intact. Changed by me this am. See below    Lab Results:  Recent Labs    12/13/22 0610 12/14/22 0352  WBC 9.7 9.4  HGB 11.6* 11.1*  HCT 32.5* 32.4*  PLT 313 353    BMET Recent Labs    12/14/22 0352 12/15/22 0514  NA 130* 130*  K 3.9 4.4  CL 100 96*  CO2 25 24  GLUCOSE 140* 120*  BUN 8 14  CREATININE 0.52 0.48  CALCIUM 7.7* 8.5*    PT/INR No results for input(s): "LABPROT", "INR" in the last 72 hours. ABG No results for input(s): "PHART", "HCO3" in the last 72 hours.  Invalid input(s): "PCO2", "PO2"  Studies/Results: No results found.  Anti-infectives: Anti-infectives (From admission, onward)    Start     Dose/Rate Route Frequency Ordered Stop   12/15/22 0930  levofloxacin (LEVAQUIN) tablet 250 mg        250 mg Oral Daily 12/15/22 0834 12/18/22 0959   12/12/22 2200  sulfamethoxazole-trimethoprim (BACTRIM DS) 800-160 MG per tablet 1 tablet  Status:  Discontinued        1 tablet Oral Every 12 hours 12/12/22 1057 12/15/22 0834   12/11/22 2200  sulfamethoxazole-trimethoprim (BACTRIM DS) 800-160 MG per tablet 1  tablet  Status:  Discontinued        1 tablet Oral Every 12 hours 12/11/22 1609 12/12/22 1057   12/07/22 1000  ceFAZolin (ANCEF) IVPB 2g/100 mL premix        2 g 200 mL/hr over 30 Minutes Intravenous To [facility] 12/07/22 0859 12/07/22 1224       Assessment/Plan: [name] - POD8 s/p open R colectomy 4/7 Dr. Dossie Der - initial imaging concerning for SBO but OR findings as above - surgical path negative for malignancy - ileus post op improving. Distension resolved. No nausea. Tolerated clears and ensure - due to prolonged poor po intake have ordered PICC/TPN - half rate today - superficial wound infection and all but 2 stables removed. Wound shallow with base intact. Daily WTD dressing changes. Abd binder - abdominal binder ordered  - encouraged OOB. PT ordered. IS   UTI - UA concerning for UTI.  Bactrim start 4/11. UCX with resistant e coli. Discussed with pharmacy and will switch to levaquin x3 days   FEN: FLD/ensure ID: ancef periop, bactrim 4/11>4/15, levaquin 4/15>4/17 VTE: lovenox Foley: periop, TOV 4/8, voiding   Hyponatremia - chronic, cont IVF Underweight - BMI 17.72. resume diet when able CAD Thyroid disease - home med HLD - home med HTN - home med   Dispo: lives in independent living at [place]. Will likely need HHRN for dressing changes. PT reccs RW and no follow  up Half rate TPN today and FLD. Hopefully home in next few days   LOS: 8 days    Deborah [name], [facility] 12/15/2022, 11:01 AM Please see [facility] for pager number during day hours 7:00am-4:30pm

## 2022-12-15 NOTE — Progress Notes (Signed)
  Progress Note   Date: 12/15/2022  Patient Name: Deborah [name]        MRN#: [id number]  Clarification of diagnosis:  severe malnutrition in setting of gastrointestinal surgery and post operative ileus

## 2022-12-15 NOTE — Progress Notes (Signed)
PHARMACY - TOTAL PARENTERAL NUTRITION CONSULT NOTE   Indication: Prolonged ileus  Patient Measurements: Height: 5' 2.99" (160 cm) Weight: 45.4 kg (100 lb) IBW/kg (Calculated) : 52.38 TPN AdjBW (KG): 45.4 Body mass index is 17.72 kg/m. Usual Weight: approx. 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 Patient underwent a R colectomy on 4/7. Patient with BM on 4/11 but imaging on 4/11 showing potential ileus or partial SBO. Patient with low PO intake at baseline, in past several days patient has had poor PO intake reporting finishing around 1/4 of a breeze now NPO. Intake limited by nausea. Pharmacy consulted for TPN management.   Patient reports typically eating 2 meals a day PTA. Last full meal that she can remember was on 4/4 prior to the onset of abdominal pain. She normally will eat lunch and dinner. Lunch typically is a 1/2 a sandwich with some type of protein. Dinner common a small protein (steak) with some vegetables (salad). Patient reports her appetite is historically low, no recent weight gain/loss. She says she is typically around 100lbs.   Glucose / Insulin: no hx of DM, CBG < 180, on SSI (0 units/24h) Electrolytes: Na: 130, K: 4.4, Mag: 1.9, others WNL Renal: Scr < 1, BUN WNL Hepatic: AST/ALT WNL, albumin: 2.7, TG: 80  Intake / Output: output not charted, LBM last charted: 4/13 GI Imaging: 4/11 KUB: potential ileus or SBO  GI Surgeries / Procedures:  4/7: open R colectomy  4/7 CT abdomen: severe SBO   Central access: 4/12  TPN start date: 4/12   Nutritional Goals: Goal TPN rate is 60 mL/hr (provides 70 g of protein and 1352 kcals per day)  RD Assessment:  70-80 g of protein  1300-1600kcal per day  >/=   Current Nutrition:  NPO TPN - decrease to half rate  Plan:  Decrease TPN to half rate of 59mL/hr at 1800, to provide 35g of protein and 676 kcals, 50% of nutritional requirements. Electrolytes in TPN: Na  18mEq/L, K 35mEq/L, Ca 51mEq/L, Mg 71mEq/L, and Phos increased to 73mmol/L. Cl:Ac 2:1 Add standard MVI and trace elements to TPN Thiamine 100mg  x 7 days added to TPN per RD request due to risk of refeeding (remove on 4/19) D/c very Sensitive q6h SSI on 4/15 Monitor TPN labs daily until stable, then every Mon/Thurs  With return of bowel function, wean TPN   Jeanella Cara, PharmD, [facility] Clinical Pharmacist Please see AMION for all Pharmacists' Contact Phone Numbers 12/15/2022, 8:40 AM

## 2022-12-15 NOTE — Progress Notes (Signed)
Physical Therapy Treatment and Discharge Patient Details Name: Daizha Alphonse. Satterthwaite MRN: [id number] DOB: 1946-05-14 Today's Date: 12/15/2022   History of Present Illness 68 female presents to ED on 4/7 with abdominal pain, n/v. CT abd/pelvis shows severe SBO, the appearance of which suggest a probable internal hernia. S/p open R colectomy on 4/7. PMH includes CAD s/p CABG, HLD, HTN, grave's disease, cancer, bilat RTC repair.    PT Comments    Excellent progress. Has been ambulating independently in hallways. Challenged with DGI scoring 20 pts indicating low fall risk. Reviewed gradual progression with gait and exercises, needs MD clearance before any light resistance training. Does not require HHPT based on progress and current status. May benefit from OPPT, but might elect to see her training with whom she is familiar with at her ILF. All questions answered. Goals met or adequate for d/c based on mobility status and low fall risk, mobilizing indepedently. Should remain active and mobile during admission, followed by mobility specialists to prevent decline in function. D/c from PT at this time.   Recommendations for follow up therapy are one component of a multi-disciplinary discharge planning process, led by the attending physician.  Recommendations may be updated based on patient status, additional functional criteria and insurance authorization.     Assistance Recommended at Discharge PRN  Patient can return home with the following Assistance with cooking/housework;Assist for transportation   Equipment Recommendations  None recommended by PT    Recommendations for Other Services       Precautions / Restrictions Precautions Precautions: Other (comment) Precaution Comments: abdominal Required Braces or Orthoses:  (binder) Restrictions Weight Bearing Restrictions: No     Mobility  Bed Mobility               General bed mobility comments: sitting EOB    Transfers Overall  transfer level: Modified independent Equipment used: None               General transfer comment: No assistance needed. Stable upon rising.    Ambulation/Gait Ambulation/Gait assistance: Modified independent (Device/Increase time) Gait Distance (Feet): 450 Feet Assistive device: None Gait Pattern/deviations: Trunk flexed, WFL(Within Functional Limits) Gait velocity: decr Gait velocity interpretation: 1.31 - 2.62 ft/sec, indicative of limited community ambulator   General Gait Details: Grossly Mod I. Demonstrates intermittent instability when challenged with dynamic tasks but able to self correct.   Stairs Stairs:  Set designer.)           Wheelchair Mobility    Modified Rankin (Stroke Patients Only)       Balance Overall balance assessment: Modified Independent         Standing balance support: No upper extremity supported Standing balance-Leahy Scale: Good                   Standardized Balance Assessment Standardized Balance Assessment : Dynamic Gait Index   Dynamic Gait Index Level Surface: Normal Change in Gait Speed: Normal Gait with Horizontal Head Turns: Mild Impairment Gait with Vertical Head Turns: Normal Gait and Pivot Turn: Normal Step Over Obstacle: Mild Impairment Step Around Obstacles: Normal Steps: Moderate Impairment Total Score: 20      Cognition Arousal/Alertness: Awake/alert Behavior During Therapy: WFL for tasks assessed/performed Overall Cognitive Status: Within Functional Limits for tasks assessed  Exercises Other Exercises Other Exercises: Reviewed standing balance exercises with counter support. Follow-up at d/c with her fitness center for continued progression with light exercises and need for clearance from MD before resistance training.    General Comments        Pertinent Vitals/Pain Pain Assessment Pain Assessment: Faces Faces Pain Scale:  Hurts a little bit Pain Location: abdomen Pain Descriptors / Indicators: Tender, Sore Pain Intervention(s): Monitored during session    Home Living                          Prior Function            PT Goals (current goals can now be found in the care plan section) Acute Rehab PT Goals Patient Stated Goal: home PT Goal Formulation: With patient Time For Goal Achievement: 12/22/22 Potential to Achieve Goals: Good Progress towards PT goals: Goals met/education completed, patient discharged from PT    Frequency    Min 3X/week      PT Plan Discharge plan needs to be updated;Equipment recommendations need to be updated    Co-evaluation              AM-PAC PT "6 Clicks" Mobility   Outcome Measure  Help needed turning from your back to your side while in a flat bed without using bedrails?: None Help needed moving from lying on your back to sitting on the side of a flat bed without using bedrails?: None Help needed moving to and from a bed to a chair (including a wheelchair)?: None Help needed standing up from a chair using your arms (e.g., wheelchair or bedside chair)?: None Help needed to walk in hospital room?: None Help needed climbing 3-5 steps with a railing? : None 6 Click Score: 24    End of Session Equipment Utilized During Treatment: Gait belt Activity Tolerance: Patient tolerated treatment well Patient left: with call bell/phone within reach;in bed Nurse Communication: Mobility status PT Visit Diagnosis: Other abnormalities of gait and mobility (R26.89)     Time: 1610-9604 PT Time Calculation (min) (ACUTE ONLY): 21 min  Charges:  $Therapeutic Activity: 8-22 mins                     Kathlyn Sacramento, PT, DPT Physical Therapist Acute Rehabilitation Services [facility] [place] [facility]    Berton Mount 12/15/2022, 9:43 AM

## 2022-12-16 MED ORDER — ENSURE ENLIVE PO LIQD
237.0000 mL | Freq: Three times a day (TID) | ORAL | Status: DC
  Administered 2022-12-16 – 2022-12-18 (×7): 237 mL via ORAL

## 2022-12-16 MED ORDER — ORAL CARE MOUTH RINSE: 15.0000 mL | OROMUCOSAL | Status: DC | PRN

## 2022-12-16 NOTE — Progress Notes (Signed)
9 Days Post-Op   Subjective/Chief Complaint: Continues to feel better. Still with some loose stools as expected. Doesn't really have much appetite or want to eat the FLD.  Is drinking her ensures well.  No nausea.   Objective: Vital signs in last 24 hours: Temp:  [98.6 F (37 C)] 98.6 F (37 C) (04/16 0400) Pulse Rate:  [84-98] 96 (04/16 0400) Resp:  [18] 18 (04/16 0400) BP: (116-135)/(63-76) 116/65 (04/16 0400) SpO2:  [99 %-100 %] 99 % (04/16 0400) Last BM Date : 12/14/22  Intake/Output from previous day: 04/15 0701 - 04/16 0700 In: 1195.1 [I.V.:1195.1] Out: -  Intake/Output this shift: No intake/output data recorded.  WDWN in NAD Abd - soft, mildly distended; incisional tenderness Midline wound - clean and packed.  No purulent drainage noted.      Lab Results:  Recent Labs    12/14/22 0352  WBC 9.4  HGB 11.1*  HCT 32.4*  PLT 353   BMET Recent Labs    12/14/22 0352 12/15/22 0514  NA 130* 130*  K 3.9 4.4  CL 100 96*  CO2 25 24  GLUCOSE 140* 120*  BUN 8 14  CREATININE 0.52 0.48  CALCIUM 7.7* 8.5*   PT/INR No results for input(s): "LABPROT", "INR" in the last 72 hours. ABG No results for input(s): "PHART", "HCO3" in the last 72 hours.  Invalid input(s): "PCO2", "PO2"  Studies/Results: No results found.  Anti-infectives: Anti-infectives (From admission, onward)    Start     Dose/Rate Route Frequency Ordered Stop   12/15/22 0930  levofloxacin (LEVAQUIN) tablet 250 mg        250 mg Oral Daily 12/15/22 0834 12/18/22 0959   12/12/22 2200  sulfamethoxazole-trimethoprim (BACTRIM DS) 800-160 MG per tablet 1 tablet  Status:  Discontinued        1 tablet Oral Every 12 hours 12/12/22 1057 12/15/22 0834   12/11/22 2200  sulfamethoxazole-trimethoprim (BACTRIM DS) 800-160 MG per tablet 1 tablet  Status:  Discontinued        1 tablet Oral Every 12 hours 12/11/22 1609 12/12/22 1057   12/07/22 1000  ceFAZolin (ANCEF) IVPB 2g/100 mL premix        2 g 200 mL/hr  over 30 Minutes Intravenous To [facility]Stay [facility] 12/07/22 0859 12/07/22 1224       Assessment/Plan: POD 9, s/p open R colectomy for [name], By Dr. Dossie Der on 4/7 - ileus post op improving. Distension resolving. No nausea. Tolerated fulls (not eating much of this) and ensure - wean TNA to off today.  Increase Ensure to QID, but also adv to soft diet, which she finds more appetizing. - superficial wound infection and all but 2 stables removed. Wound shallow with base intact. Daily WTD dressing changes. Abd binder - abdominal binder ordered  - encouraged OOB. PT ordered. IS   UTI - UA concerning for UTI.  Bactrim start 4/11. UCX with ESBL today. Discussed with pharmacy and switched to levaquin x3 days, yesterday   FEN: soft/ensure/wean TNA to off ID: ancef periop, bactrim 4/11>4/15, levaquin 4/15>4/17 VTE: lovenox Foley: periop, TOV 4/8, voiding   Hyponatremia - chronic, SLIV Underweight - BMI 17.72. resume diet CAD Thyroid disease - home med HLD - home med HTN - home med   Dispo: to [facility] in rehab.  Likely home on Thursday pending oral intake, diet tolerance   LOS: 9 days    Letha Cape, [facility] Surgery 12/16/2022, 9:49 AM Please see Amion for pager number during day  hours 7:00am-4:30pm

## 2022-12-16 NOTE — Progress Notes (Signed)
Nutrition Follow-up  DOCUMENTATION CODES:   Severe malnutrition in context of social or environmental circumstances, Underweight  INTERVENTION:  Continue Ensure Enlive po QID as tolerated, each supplement provides 350 kcal and 20 grams of protein [employer] Essentials once daily at night to try, each packet mixed with 8 ounces of 2% milk provides 13 grams of protein and 260 calories. Request updated measured weight  NUTRITION DIAGNOSIS:   Severe Malnutrition related to social / environmental circumstances as evidenced by severe fat depletion, severe muscle depletion. - remains applicable  GOAL:   Patient will meet greater than or equal to 90% of their needs - goal met via TPN, although weaning today; will address with nutrition supplements  MONITOR:   I & O's, Labs, Weight trends, Diet advancement  REASON FOR ASSESSMENT:   Consult New TPN/TNA  ASSESSMENT:   77 y.o. female with PMHx including CAD s/p CABG, HLD, HTN, grave's disease, cancer, bilat RTC repair admitted due to abdominal pain related to small bowel obstruction with concern for internal hernia or closed loop obstruction.  4/7 - s/p R open colectomy for cecal volvulus  Plans to d/c TPN today.   Pt reports that she remains with a very minimal appetite which has been a chronic problem. She does not enjoy Ensure very much but states that she is able to tolerate this as she knows this will help increase her nutritional adequacy. Surgery increasing frequency to QID. She is agreeable to trying a carnation instant breakfast to determine if these are better tolerated and will continue at home if so.   At time of visit, she had not eaten any solid foods yet but was planning to order lunch just after.   Denies n/v, having some loose stools.   Medications: colace, tapazole, protonix  Labs: sodium 130, CBG's 103 x24 hours  Diet Order:   Diet Order             DIET SOFT Room service appropriate? Yes; Fluid  consistency: Thin  Diet effective now                   EDUCATION NEEDS:   Education needs have been addressed  Skin:  Skin Assessment: Skin Integrity Issues: Skin Integrity Issues:: Incisions Incisions: abdomen  Last BM:  4/16 (type 6 medium)  Height:   Ht Readings from Last 1 Encounters:  12/07/22 5' 2.99" (1.6 m)    Weight:   Wt Readings from Last 1 Encounters:  12/07/22 45.4 kg   BMI:  Body mass index is 17.72 kg/m.  Estimated Nutritional Needs:   Kcal:  1300-1600 kcal  Protein:  70-80 g  Fluid:  >/= 1.5 L  Drusilla Kanner, RDN, LDN Clinical Nutrition

## 2022-12-17 NOTE — TOC Progression Note (Addendum)
Transition of Care (TOC) - Progression Note    Patient Details  Name: Deborah [name]. Schmutz MRN: [id number] Date of Birth: 02-11-1946  Transition of Care [facility]) CM/SW Contact  Erin Sons, [place] Phone Number: 12/17/2022, 11:38 AM  Clinical Narrative:     CSW notified that pt stable for DC tomorrow and that pt states she is going to [facility] SNF level of care. Pt currently recommended for OP PT. CSW left message with [facility] SNF liaison requesting return call.   Received return call shortly after and confirmed that [facility] is expecting her to DC to their SNF. They are requesting pt takes PTAR as their transport will not be available for pt. CSW explained pt is walking independently and may not need; [facility] felt she would need PTAR. CSW to complete fl2 and fax in hub.  1430: CSW met with pt and confirmed plan for her to go to [facility] SNF with anticipated DC of tomorrow. CSW explained PTAR and potential that she may receive a bill. She will think about transport preference and consider arranging family/friends to transport her. TOC to follow up in the morning.   Expected Discharge Plan: OP Rehab Barriers to Discharge: Continued Medical Work up  Expected Discharge Plan and Services       Living arrangements for the past 2 months: Independent Living Facility                                       Social Determinants of Health (SDOH) Interventions SDOH Screenings   Food Insecurity: No Food Insecurity (12/07/2022)  Housing: Low Risk  (12/07/2022)  Transportation Needs: No Transportation Needs (12/07/2022)  Utilities: Not At Risk (12/07/2022)  Depression (PHQ2-9): Low Risk  (06/24/2019)  Financial Resource Strain: Low Risk  (05/04/2018)  Physical Activity: Sufficiently Active (05/04/2018)  Stress: Stress Concern Present (05/04/2018)  Tobacco Use: Medium Risk (12/08/2022)    Readmission Risk Interventions     No data to display

## 2022-12-17 NOTE — NC FL2 (Signed)
[place] MEDICAID FL2 LEVEL OF CARE FORM     IDENTIFICATION  Patient Name: Deborah [name]. Kemmer Birthdate: [birth year] Sex: female Admission Date (Current Location): 12/07/2022  [place] and [place] Number:  Producer, television/film/video and Address:  [facility] [place], [address], [place], [place] [address]      Provider Number: [id number]  Attending Physician Name and Address:  Montez Morita, Md, MD  Relative Name and Phone Number:  Netta Corrigan (Niece)  [phone] [facility])    Current Level of Care: Hospital Recommended Level of Care: Skilled Nursing Facility Prior Approval Number:    Date Approved/Denied:   PASRR Number: [id number]  Discharge Plan: SNF    Current Diagnoses: Patient Active Problem List   Diagnosis Date Noted   Protein-calorie malnutrition, severe 12/15/2022   Small bowel obstruction 12/07/2022   Elevated blood pressure reading 07/19/2021   CAD (coronary artery disease) 03/03/2018   Unstable angina 02/26/2018   Hyperlipemia 02/26/2018   NSTEMI (non-ST elevated myocardial infarction) 02/26/2018    Orientation RESPIRATION BLADDER Height & Weight     Self, Time, Situation, Place  Normal Continent Weight: 100 lb (45.4 kg) Height:  5' 2.99" (160 cm)  BEHAVIORAL SYMPTOMS/MOOD NEUROLOGICAL BOWEL NUTRITION STATUS      Continent Diet (see d/c summary)  AMBULATORY STATUS COMMUNICATION OF NEEDS Skin   Supervision Verbally Surgical wounds (incision abdomen)                       Personal Care Assistance Level of Assistance  Feeding, Bathing, Dressing Bathing Assistance: Limited assistance Feeding assistance: Independent Dressing Assistance: Limited assistance     Functional Limitations Info  Sight, Hearing, Speech Sight Info: Adequate Hearing Info: Adequate Speech Info: Adequate    SPECIAL CARE FACTORS FREQUENCY  PT (By licensed PT), OT (By licensed OT)     PT Frequency: 5x/week OT Frequency: 5x/week            Contractures  Contractures Info: Not present    Additional Factors Info  Code Status, Allergies Code Status Info: full code Allergies Info: Atorvastatin, Simvastatin, Rosuvastatin           Current Medications (12/17/2022):  This is the current hospital active medication list Current Facility-Administered Medications  Medication Dose Route Frequency Provider Last Rate Last Admin   acetaminophen (TYLENOL) tablet 1,000 mg  1,000 mg Oral Q6H Eric Form, PA-C   1,000 mg at 12/09/22 2322   alum & mag hydroxide-simeth (MAALOX/MYLANTA) 200-200-20 MG/5ML suspension 15 mL  15 mL Oral Q4H PRN Eric Form, PA-C   15 mL at 12/09/22 0818   aspirin chewable tablet 81 mg  81 mg Oral Daily Joaquim Nam, RPH   81 mg at 12/17/22 0845   Chlorhexidine Gluconate Cloth 2 % PADS 6 each  6 each Topical Daily Eric Form, PA-C   6 each at 12/17/22 2440   docusate sodium (COLACE) capsule 100 mg  100 mg Oral BID Eric Form, PA-C   100 mg at 12/16/22 2158   enoxaparin (LOVENOX) injection 30 mg  30 mg Subcutaneous Daily Stechschulte, Hyman Hopes, MD   30 mg at 12/17/22 0845   feeding supplement (ENSURE ENLIVE / ENSURE PLUS) liquid 237 mL  237 mL Oral TID WC & HS Barnetta Chapel, PA-C   237 mL at 12/17/22 1220   HYDROmorphone (DILAUDID) injection 0.5 mg  0.5 mg Intravenous Q3H PRN Stechschulte, Hyman Hopes, MD       methimazole (TAPAZOLE)  tablet 5 mg  5 mg Oral Once per day on Mon Tue Thu Fri Joaquim Nam, RPH   5 mg at 12/16/22 6962   methocarbamol (ROBAXIN) 500 mg in dextrose 5 % 50 mL IVPB  500 mg Intravenous Q6H PRN Stechschulte, Hyman Hopes, MD       metoprolol tartrate (LOPRESSOR) injection 5 mg  5 mg Intravenous Q6H PRN Eric Form, PA-C       metoprolol tartrate (LOPRESSOR) tablet 25 mg  25 mg Oral BID Joaquim Nam, RPH   25 mg at 12/16/22 2158   ondansetron (ZOFRAN) injection 4 mg  4 mg Intravenous Q6H PRN Stechschulte, Hyman Hopes, MD   4 mg at 12/12/22 0010   Oral care mouth rinse  15 mL Mouth Rinse PRN  Poenaru, Petru D, RN       oxyCODONE (Oxy IR/ROXICODONE) immediate release tablet 5 mg  5 mg Oral Q4H PRN Eric Form, PA-C       pantoprazole (PROTONIX) injection 40 mg  40 mg Intravenous Q24H Eric Form, PA-C   40 mg at 12/17/22 0845   polyethylene glycol (MIRALAX / GLYCOLAX) packet 17 g  17 g Oral Daily PRN Eric Form, PA-C       pravastatin (PRAVACHOL) tablet 40 mg  40 mg Oral QHS Joaquim Nam, RPH   40 mg at 12/16/22 2158   prochlorperazine (COMPAZINE) injection 10 mg  10 mg Intravenous Q4H PRN Stechschulte, Hyman Hopes, MD   10 mg at 12/09/22 0819   sodium chloride flush (NS) 0.9 % injection 10-40 mL  10-40 mL Intracatheter Q12H Eric Form, PA-C   10 mL at 12/17/22 9528   sodium chloride flush (NS) 0.9 % injection 10-40 mL  10-40 mL Intracatheter PRN Eric Form, PA-C       traMADol Janean Sark) tablet 50 mg  50 mg Oral Q6H PRN Eric Form, PA-C         Discharge Medications: Please see discharge summary for a list of discharge medications.  Relevant Imaging Results:  Relevant Lab Results:   Additional Information SS#: [id number]  Erin Sons, LCSW

## 2022-12-17 NOTE — Care Plan (Signed)
Patient Deborah [name], no pain. Dressing change done, wound healing, no signs of infection, red granulation tissues noted. Patient was educated on how to do the dressing change self in case that is needed after discharge. No complains noted during the day.  Patient would like to know how long she should be in isolation once she will be discharged from the hospital due to her ESBL in the urine and would like to talk with the doctor about this before discharge.

## 2022-12-17 NOTE — Progress Notes (Signed)
10 Days Post-Op   Subjective/Chief Complaint: Continues to feel better. Ate some solid food yesterday and today.  Still drinking her Ensure.  Mobilizing well.   Objective: Vital signs in last 24 hours: Temp:  [98.1 F (36.7 C)-98.7 F (37.1 C)] 98.5 F (36.9 C) (04/17 0400) Pulse Rate:  [92-105] 93 (04/17 0845) Resp:  [16-18] 16 (04/17 0400) BP: (102-124)/(64-69) 102/66 (04/17 0845) SpO2:  [95 %-98 %] 98 % (04/17 0400) Last BM Date : 12/16/22  Intake/Output from previous day: 04/16 0701 - 04/17 0700 In: 687 [P.O.:687] Out: -  Intake/Output this shift: No intake/output data recorded.  WDWN in NAD Abd - soft, ND; incisional tenderness Midline wound - clean and packed.  No purulent drainage noted.    Lab Results:  No results for input(s): "WBC", "HGB", "HCT", "PLT" in the last 72 hours.  BMET Recent Labs    12/15/22 0514  NA 130*  K 4.4  CL 96*  CO2 24  GLUCOSE 120*  BUN 14  CREATININE 0.48  CALCIUM 8.5*   PT/INR No results for input(s): "LABPROT", "INR" in the last 72 hours. ABG No results for input(s): "PHART", "HCO3" in the last 72 hours.  Invalid input(s): "PCO2", "PO2"  Studies/Results: No results found.  Anti-infectives: Anti-infectives (From admission, onward)    Start     Dose/Rate Route Frequency Ordered Stop   12/15/22 0930  levofloxacin (LEVAQUIN) tablet 250 mg        250 mg Oral Daily 12/15/22 0834 12/17/22 0845   12/12/22 2200  sulfamethoxazole-trimethoprim (BACTRIM DS) 800-160 MG per tablet 1 tablet  Status:  Discontinued        1 tablet Oral Every 12 hours 12/12/22 1057 12/15/22 0834   12/11/22 2200  sulfamethoxazole-trimethoprim (BACTRIM DS) 800-160 MG per tablet 1 tablet  Status:  Discontinued        1 tablet Oral Every 12 hours 12/11/22 1609 12/12/22 1057   12/07/22 1000  ceFAZolin (ANCEF) IVPB 2g/100 mL premix        2 g 200 mL/hr over 30 Minutes Intravenous To [facility] 12/07/22 0859 12/07/22 1224        Assessment/Plan: POD 10, s/p open R colectomy for [name], By Dr. Dossie Der on 4/7 - ileus resolved. -Tolerating soft diet and ensure - TNA off - superficial wound infection and all but 2 stables removed. Wound shallow with base intact. Daily WTD dressing changes. Wound is clean and packed.  DC staples 4/18 prior to DC - encouraged OOB. PT ordered. IS   UTI - UA concerning for UTI.  Bactrim start 4/11. UCX with ESBL today. Discussed with pharmacy and switched to levaquin x3 days.   FEN: soft/ensure ID: ancef periop, bactrim 4/11>4/15, levaquin 4/15>4/17 VTE: lovenox Foley: periop, TOV 4/8, voiding   Hyponatremia - chronic, SLIV Underweight - BMI 17.72. resume diet CAD Thyroid disease - home med HLD - home med HTN - home med   Dispo: to [facility] in rehab on 4/18   LOS: 10 days    Letha Cape, [facility] Surgery 12/17/2022, 11:22 AM Please see Amion for pager number during day hours 7:00am-4:30pm

## 2022-12-18 MED ORDER — PANTOPRAZOLE SODIUM 40 MG PO TBEC: 40.0000 mg | DELAYED_RELEASE_TABLET | Freq: Every day | ORAL | Status: DC

## 2022-12-18 MED ORDER — ACETAMINOPHEN 500 MG PO TABS: 1000.0000 mg | ORAL_TABLET | Freq: Four times a day (QID) | ORAL | 0 refills | Status: DC | PRN

## 2022-12-18 NOTE — Discharge Summary (Signed)
Patient ID: Deborah [name] [id number] 1945/09/10 77 y.o.  Admit date: 12/07/2022 Discharge date: 12/18/2022  Admitting Diagnosis: SBO  Discharge Diagnosis Patient Active Problem List   Diagnosis Date Noted   Protein-calorie malnutrition, severe 12/15/2022   Small bowel obstruction 12/07/2022   Elevated blood pressure reading 07/19/2021   CAD (coronary artery disease) 03/03/2018   Unstable angina 02/26/2018   Hyperlipemia 02/26/2018   NSTEMI (non-ST elevated myocardial infarction) 02/26/2018  [name], s/p R colectomy  Consultants none  Reason for Admission: Deborah Sine B. Wire is an 77 y.o. female who is here for abdominal pain.   Her pain started on Thursday.  It comes and goes in waves.  It has gotten progressively worse over the last few days.  She started having severe vomiting and worse pain yesterday so she presented to the [facility] emergency department.   Her only abdominal surgery was years ago in her 84s when she had a Pfannenstiel incision for a cyst removal from her right ovary.  In 2019 she had a coronary artery bypass graft and does have a hernia in her epigastric region from her drain site.  Procedures 12/07/22, Dr. Dossie Der Open right colectomy  Hospital Course:  The patient was admitted and due to minimal improvement in NGT decompression at [facility] ED prior to being transferred, along with severe dilatation and concern for significant process, the patient was taken to the OR upon arrival to [facility].  She was found to have a cecal volvulus intra-operatively and underwent a right colectomy.  She tolerated this well.  Due to poor PO intake and mild post op ileus, she was started on TNA.  She did develop a wound infection and her wound was opened on POD 5 and started on daily dressing changes.  Her bowel function returned, TNA weaned, and diet advanced.  She was tolerating a soft diet, with small intake, but mostly Ensures at the time of discharge with no issues.  She  was only taking Tylenol for pain and she did not want any narcotics at discharge.  She was noted to have a UA on admission.  This grew ESBL.  Her abx therapy was transitioned from Bactrim to Levaquin to complete treatment for ESBL.    Otherwise, she was seen by PT and recommended outpatient PT which [facility] will provide upon return to the rehab unit.  She was otherwise medically stable throughout her admission.  She was stable on POD 11 for DC home.  Physical Exam: Gen: NAD Abd: soft, appropriately tender, but minimal, wound is clean and packed.  +BS  Allergies as of 12/18/2022       Reactions   Atorvastatin Other (See Comments)   Muscle and joint aches   Simvastatin Other (See Comments)   Muscle and joint aches   Rosuvastatin Other (See Comments)   Muscle and joint aches        Medication List     TAKE these medications    acetaminophen 500 MG tablet Commonly known as: TYLENOL Take 2 tablets (1,000 mg total) by mouth every 6 (six) hours as needed.   aspirin 81 MG tablet Take 81 mg by mouth daily.   CALCIUM+D3 PO Take 1 tablet by mouth 2 (two) times daily after a meal.   methimazole 5 MG tablet Commonly known as: TAPAZOLE Take 5 mg by mouth 4 (four) times a week.   metoprolol tartrate 25 MG tablet Commonly known as: LOPRESSOR Take 1 tablet (25 mg total) by mouth 2 (two)  times daily. What changed: how much to take   One-A-Day Womens Formula Tabs Take 1 tablet by mouth daily.   Praluent 75 MG/ML Soaj Generic drug: Alirocumab Inject 1 Syringe into the skin every 14 (fourteen) days.   pravastatin 40 MG tablet Commonly known as: PRAVACHOL Take 40 mg by mouth at bedtime.   Vitamin D-3 25 MCG (1000 UT) Caps Take 1,000 Units by mouth at bedtime.          Follow-up Information     Stechschulte, Hyman Hopes, MD Follow up on 01/01/2023.   Specialty: Surgery Why: 11:10am, Arrive 30 minutes prior to your appointment time, Please bring your insurance card and photo  ID Contact information: [address] [place] [address] [phone]         Cleatis Polka., MD Follow up.   Specialty: Internal Medicine Why: As needed Contact information: [address] [place] [address] [phone]                 Signed: Barnetta Chapel, [facility] Surgery 12/18/2022, 8:38 AM Please see Amion for pager number during day hours 7:00am-4:30pm, 7-11:30am on Weekends

## 2022-12-18 NOTE — Progress Notes (Signed)
Gave report to the facility.

## 2022-12-18 NOTE — TOC Progression Note (Signed)
Transition of Care (TOC) - Progression Note    Patient Details  Name: Deborah [name]. Tomasini MRN: [id number] Date of Birth: 09-Jun-1946  Transition of Care [facility]) CM/SW Contact  Erin Sons, [place] Phone Number: 12/18/2022, 10:43 AM  Clinical Narrative:     RN on unit reported they already called report and arranged transport to [facility]. CSW called and updated admissions coordinator with [facility]. CSW sent DC summary in hub.    Expected Discharge Plan: Skilled Nursing Facility Barriers to Discharge: Continued Medical Work up  Expected Discharge Plan and Services       Living arrangements for the past 2 months: Independent Living Facility Expected Discharge Date: 12/18/22                                     Social Determinants of Health (SDOH) Interventions SDOH Screenings   Food Insecurity: No Food Insecurity (12/07/2022)  Housing: Low Risk  (12/07/2022)  Transportation Needs: No Transportation Needs (12/07/2022)  Utilities: Not At Risk (12/07/2022)  Depression (PHQ2-9): Low Risk  (06/24/2019)  Financial Resource Strain: Low Risk  (05/04/2018)  Physical Activity: Sufficiently Active (05/04/2018)  Stress: Stress Concern Present (05/04/2018)  Tobacco Use: Medium Risk (12/08/2022)    Readmission Risk Interventions     No data to display

## 2022-12-18 NOTE — Progress Notes (Signed)
PICC line removed per MD order. Patient education provided with teach back including stay in bed 30 minutes post removal; keep dressing clean, dry, & intact for 24 hours; signs of infection/notify MD for redness, swelling, pain, and/or discharge at site or fever.

## 2022-12-19 ENCOUNTER — Non-Acute Institutional Stay (SKILLED_NURSING_FACILITY): Payer: Medicare Other | Admitting: Internal Medicine

## 2022-12-19 ENCOUNTER — Encounter: Payer: Self-pay | Admitting: Internal Medicine

## 2022-12-19 DIAGNOSIS — E782 Mixed hyperlipidemia: Secondary | ICD-10-CM

## 2022-12-19 DIAGNOSIS — K56691 Other complete intestinal obstruction: Secondary | ICD-10-CM | POA: Diagnosis not present

## 2022-12-19 DIAGNOSIS — Z9889 Other specified postprocedural states: Secondary | ICD-10-CM | POA: Diagnosis not present

## 2022-12-19 DIAGNOSIS — I2510 Atherosclerotic heart disease of native coronary artery without angina pectoris: Secondary | ICD-10-CM

## 2022-12-19 DIAGNOSIS — T8130XS Disruption of wound, unspecified, sequela: Secondary | ICD-10-CM | POA: Diagnosis not present

## 2022-12-19 DIAGNOSIS — E0590 Thyrotoxicosis, unspecified without thyrotoxic crisis or storm: Secondary | ICD-10-CM | POA: Diagnosis not present

## 2022-12-19 DIAGNOSIS — M6259 Muscle wasting and atrophy, not elsewhere classified, multiple sites: Secondary | ICD-10-CM | POA: Diagnosis not present

## 2022-12-19 DIAGNOSIS — R531 Weakness: Secondary | ICD-10-CM | POA: Diagnosis not present

## 2022-12-19 NOTE — Progress Notes (Signed)
Provider:   Location:  [facility] Room Number: 157A Place of Service:  SNF (31)  PCP: Cleatis Polka., MD Patient Care Team: Cleatis Polka., MD as PCP - General (Internal Medicine) Lyn Records, MD (Inactive) as PCP - Cardiology (Cardiology)  Extended Emergency Contact Information Primary Emergency Contact: BALL,MELISSA Mobile Phone: [phone] Relation: Niece Secondary Emergency Contact: teer,Lenore Home Phone: [phone] Relation: Friend  Code Status: *** Goals of Care: Advanced Directive information    12/19/2022   11:26 AM  Advanced Directives  Does Patient Have a Medical Advance Directive? Yes  Type of Advance Directive Out of facility DNR (pink MOST or yellow form);Living will  Does patient want to make changes to medical advance directive? No - Patient declined      Chief Complaint  Patient presents with   New Admit To SNF    Patient is being seen for new Admit    Immunizations    Patient is due for pneumonia , shingles, and tetanus vaccine [facility] verified   Health Maintenance    Discussed the need for hep c screening,AWV, and bone density    HPI: Patient is a 77 y.o. female seen today for admission to SNF  Patient was admitted in the hospital from 04/7-04/18 for Cecal  volvulus s/p right colectomy followed by wound infection.  [place] [facility] She has a history of NSTEMI 620/2019 s/p CABG, atrial septal aneurysm, HLD, hypothyroidism, osteoarthritis  Patient went to ED with abdominal pain nausea vomiting.  Her CT scan showed SBO.  In the OR she was found to have cecal volvulus and underwent right colectomy.  Due to poor p.o. intake and ileus she was started on TNA.  Then she developed wound infection and her wound was opened on POD 5. She also was found to have a UTI which grew ESBL.  She was treated with Levaquin .  Patient eventually was started taking p.o. and was discharged to [facility] .  Patient is doing really good eating no nausea no abdominal pain she wants to go back to her apartment    Past Medical History:  Diagnosis Date   Arthritis    "qwhere" (02/26/2018)   Atrial septal aneurysm    Cancer    "chest" (02/26/2018)   Coronary artery disease    Graves' disease    High cholesterol    Mild carotid artery disease    Mild mitral regurgitation    Thyroid disease    Past Surgical History:  Procedure Laterality Date   ANTERIOR CRUCIATE LIGAMENT REPAIR Left    BASAL CELL CARCINOMA EXCISION     "chest" (02/26/2018)   CARDIAC CATHETERIZATION     CARPAL TUNNEL RELEASE Right    CATARACT EXTRACTION W/ INTRAOCULAR LENS  IMPLANT, BILATERAL Bilateral    CORONARY ARTERY BYPASS GRAFT N/A 03/02/2018   Procedure: CORONARY ARTERY BYPASS GRAFTING (CABG);  Surgeon: Delight Ovens, MD;  Location: [facility] OR;  Service: Open Heart Surgery;  Laterality: N/A;  Times 3 using left internal mammary artery to LAD and endoscopically harvested left saphenous vein to Diagonal and PDA.   DILATION AND CURETTAGE OF UTERUS     LAPAROTOMY N/A 12/07/2022   Procedure: OPEN RIGHT COLECTOMY;  Surgeon: Quentin Ore, MD;  Location: [facility] OR;  Service: General;  Laterality: N/A;   LEFT HEART CATH AND CORONARY ANGIOGRAPHY N/A 02/26/2018   Procedure: LEFT HEART CATH AND CORONARY ANGIOGRAPHY;  Surgeon: Marykay Lex, MD;  Location: [facility] INVASIVE CV LAB;  Service: Cardiovascular;  Laterality: N/A;   RIGHT OOPHORECTOMY Right 1970s   SHOULDER ARTHROSCOPY W/ ROTATOR CUFF REPAIR Bilateral    TEE WITHOUT CARDIOVERSION N/A 03/02/2018   Procedure: TRANSESOPHAGEAL ECHOCARDIOGRAM (TEE);  Surgeon: Delight Ovens, MD;  Location: [facility] OR;  Service: Open Heart Surgery;  Laterality: N/A;   TONSILLECTOMY     TRIGGER FINGER RELEASE Bilateral    "8 of them"    reports that she has quit smoking. Her smoking use included cigarettes. She has a 15.00 pack-year smoking history. She has never used smokeless tobacco.  She reports current alcohol use of about 6.0 standard drinks of alcohol per week. She reports that she does not use drugs. Social History   Socioeconomic History   Marital status: Widowed    Spouse name: John   Number of children: 0   Years of education: Not on file   Highest education level: Not on file  Occupational History   Not on file  Tobacco Use   Smoking status: Former    Packs/day: 0.50    Years: 30.00    Additional pack years: 0.00    Total pack years: 15.00    Types: Cigarettes   Smokeless tobacco: Never   Tobacco comments:    02/26/2018 "nothing since before 2000"  Vaping Use   Vaping Use: Never used  Substance and Sexual Activity   Alcohol use: Yes    Alcohol/week: 6.0 standard drinks of alcohol    Types: 6 Standard drinks or equivalent per week   Drug use: Never   Sexual activity: Not Currently  Other Topics Concern   Not on file  Social History Narrative   Not on file   Social Determinants of Health   Financial Resource Strain: Low Risk  (05/04/2018)   Overall Financial Resource Strain (CARDIA)    Difficulty of Paying Living Expenses: Not hard at all  Food Insecurity: No Food Insecurity (12/07/2022)   Hunger Vital Sign    Worried About Running Out of Food in the Last Year: Never true    Ran Out of Food in the Last Year: Never true  Transportation Needs: No Transportation Needs (12/07/2022)   PRAPARE - Administrator, Civil Service (Medical): No    Lack of Transportation (Non-Medical): No  Physical Activity: Sufficiently Active (05/04/2018)   Exercise Vital Sign    Days of Exercise per Week: 7 days    Minutes of Exercise per Session: 30 min  Stress: Stress Concern Present (05/04/2018)   [employer] of Occupational Health - Occupational Stress Questionnaire    Feeling of Stress : To some extent  Social Connections: Not on file  Intimate Partner Violence: Not At Risk (12/07/2022)   Humiliation, Afraid, Rape, and Kick questionnaire    Fear of  Current or Ex-Partner: No    Emotionally Abused: No    Physically Abused: No    Sexually Abused: No    Functional Status Survey:    Family History  Problem Relation Age of Onset   Osteoporosis Mother    CAD Father        1st Mi around age 63   Hypercholesterolemia Father    Hypercholesterolemia Sister    Multiple myeloma Brother     Health Maintenance  Topic Date Due   Pneumonia Vaccine 5+ Years old (1 of 2 - PCV) Never done   Hepatitis C Screening  Never done   Zoster Vaccines- Shingrix (1 of 2) Never done  DEXA SCAN  10/12/2009   DTaP/Tdap/Td (2 - Td or Tdap) 05/23/2015   Medicare Annual Wellness (AWV)  06/21/2022   COVID-19 Vaccine (3 - 2023-24 season) 01/04/2023 (Originally 05/02/2022)   INFLUENZA VACCINE  04/02/2023   HPV VACCINES  Aged Out    Allergies  Allergen Reactions   Atorvastatin Other (See Comments)    Muscle and joint aches   Simvastatin Other (See Comments)    Muscle and joint aches   Rosuvastatin Other (See Comments)    Muscle and joint aches    Outpatient Encounter Medications as of 12/19/2022  Medication Sig   acetaminophen (TYLENOL) 500 MG tablet Take 2 tablets (1,000 mg total) by mouth every 6 (six) hours as needed.   aspirin 81 MG tablet Take 81 mg by mouth daily.   Calcium Carb-Cholecalciferol (CALCIUM+D3 PO) Take 1 tablet by mouth 2 (two) times daily after a meal.   Cholecalciferol (VITAMIN D-3) 1000 units CAPS Take 1,000 Units by mouth at bedtime.   methimazole (TAPAZOLE) 5 MG tablet Take 5 mg by mouth 4 (four) times a week.   metoprolol tartrate (LOPRESSOR) 25 MG tablet Take 1 tablet (25 mg total) by mouth 2 (two) times daily. (Patient taking differently: Take 12.5 mg by mouth 2 (two) times daily.)   Multiple Vitamins-Calcium (ONE-A-DAY WOMENS FORMULA) TABS Take 1 tablet by mouth daily.   PRALUENT 75 MG/ML SOAJ Inject 1 Syringe into the skin every 14 (fourteen) days.   pravastatin (PRAVACHOL) 40 MG tablet Take 40 mg by mouth at bedtime.  (Patient not taking: Reported on 12/19/2022)   No facility-administered encounter medications on file as of 12/19/2022.    Review of Systems  Constitutional:  Negative for activity change and appetite change.  HENT: Negative.    Respiratory:  Negative for cough and shortness of breath.   Cardiovascular:  Negative for leg swelling.  Gastrointestinal:  Negative for constipation.  Genitourinary: Negative.   Musculoskeletal:  Negative for arthralgias, gait problem and myalgias.  Skin: Negative.   Neurological:  Negative for dizziness and weakness.  Psychiatric/Behavioral:  Negative for confusion, dysphoric mood and sleep disturbance.     Vitals:   12/19/22 1119  BP: 121/76  Pulse: (!) 108  Resp: 18  Temp: 97.9 F (36.6 C)  TempSrc: Temporal  SpO2: 96%  Weight: 94 lb 3.2 oz (42.7 kg)  Height:  (1.575 m)   Body mass index is 17.23 kg/m. Physical Exam Vitals reviewed.  Constitutional:      Appearance: Normal appearance.  HENT:     Head: Normocephalic.     Nose: Nose normal.     Mouth/Throat:     Mouth: Mucous membranes are moist.     Pharynx: Oropharynx is clear.  Eyes:     Pupils: Pupils are equal, round, and reactive to light.  Cardiovascular:     Rate and Rhythm: Normal rate and regular rhythm.     Pulses: Normal pulses.     Heart sounds: Normal heart sounds. No murmur heard. Pulmonary:     Effort: Pulmonary effort is normal.     Breath sounds: Normal breath sounds.  Abdominal:     General: Abdomen is flat. Bowel sounds are normal.     Palpations: Abdomen is soft.     Comments: Mid abdominal open wound very clean superficial and clean edges with no discharge  Musculoskeletal:        General: No swelling.     Cervical back: Neck supple.  Skin:    General: Skin  is warm.  Neurological:     General: No focal deficit present.     Mental Status: She is alert and oriented to person, place, and time.  Psychiatric:        Mood and Affect: Mood normal.         Thought Content: Thought content normal.     Labs reviewed: Basic Metabolic Panel: Recent Labs    12/13/22 0610 12/14/22 0352 12/15/22 0514  NA 130* 130* 130*  K 3.6 3.9 4.4  CL 96* 100 96*  CO2 26 25 24   GLUCOSE 131* 140* 120*  BUN 9 8 14   CREATININE 0.51 0.52 0.48  CALCIUM 7.8* 7.7* 8.5*  MG 1.8 2.0 1.9  PHOS 2.5 3.5 4.1   Liver Function Tests: Recent Labs    12/13/22 0610 12/14/22 0352 12/15/22 0514  AST 52* 27 29  ALT 73* 51* 46*  ALKPHOS 43 39 38  BILITOT 0.6 0.4 0.3  PROT 5.1* 4.9* 5.4*  ALBUMIN 2.7* 2.5* 2.7*   Recent Labs    12/07/22 0340  LIPASE 50   No results for input(s): "AMMONIA" in the last 8760 hours. CBC: Recent Labs    12/07/22 0340 12/07/22 1552 12/12/22 0432 12/13/22 0610 12/14/22 0352  WBC 17.4*   < > 10.0 9.7 9.4  NEUTROABS 14.6*  --   --   --   --   HGB 16.2*   < > 13.0 11.6* 11.1*  HCT 47.0*   < > 38.1 32.5* 32.4*  MCV 97.1   < > 96.5 94.5 97.3  PLT 291   < > 308 313 353   < > = values in this interval not displayed.   Cardiac Enzymes: No results for input(s): "CKTOTAL", "CKMB", "CKMBINDEX", "TROPONINI" in the last 8760 hours. BNP: Invalid input(s): "POCBNP" Lab Results  Component Value Date   HGBA1C 5.4 03/02/2018   No results found for: "TSH" No results found for: "VITAMINB12" No results found for: "FOLATE" No results found for: "IRON", "TIBC", "FERRITIN"  Imaging and Procedures obtained prior to SNF admission: DG Abd Portable 1 View  Result Date: 12/07/2022 CLINICAL DATA:  77 year old female NG tube placement. EXAM: PORTABLE ABDOMEN - 1 VIEW COMPARISON:  CT Abdomen and Pelvis 0456 hours today. FINDINGS: Portable AP supine view at 0645 hours. The enteric tube courses to the level of the gastric hernia, gastroesophageal junction but then is folded back on itself, at the level of the diaphragm. Lung bases remain clear. Prior CABG. Stable visible bowel gas pattern, with excreted renal IV contrast now. Scoliosis.  IMPRESSION: 1. Enteric tube is kinked at the level of the hiatal hernia. Recommend repositioning attempt with repeat KUB. 2. Otherwise stable from CT Abdomen and Pelvis 0456 hours today. Electronically Signed   By: Odessa Fleming M.D.   On: 12/07/2022 07:21   CT ABDOMEN PELVIS W CONTRAST  Result Date: 12/07/2022 CLINICAL DATA:  77 year old female with history of acute onset of nonlocalized abdominal pain. EXAM: CT ABDOMEN AND PELVIS WITH CONTRAST TECHNIQUE: Multidetector CT imaging of the abdomen and pelvis was performed using the standard protocol following bolus administration of intravenous contrast. RADIATION DOSE REDUCTION: This exam was performed according to the departmental dose-optimization program which includes automated exposure control, adjustment of the mA and/or kV according to patient size and/or use of iterative reconstruction technique. CONTRAST:  OMNIPAQUE IOHEXOL 300 MG/ML  SOLN COMPARISON:  CT of the chest, abdomen and pelvis 03/28/2018. FINDINGS: Lower chest: Atherosclerotic calcifications in the descending thoracic aorta as well  as the left anterior descending, left circumflex and right coronary arteries. Status post median sternotomy. Moderate-sized hiatal hernia. Hepatobiliary: No suspicious cystic or solid hepatic lesions. No intra or extrahepatic biliary ductal dilatation. Gallbladder is unremarkable in appearance. Pancreas: No pancreatic mass. No pancreatic ductal dilatation. No pancreatic or peripancreatic fluid collections or inflammatory changes. Spleen: Unremarkable. Adrenals/Urinary Tract: Multiple subcentimeter low-attenuation lesions in both kidneys, too small to characterize, but statistically likely to represent cysts (no imaging follow-up recommended). In addition, in the upper pole of the left kidney (coronal image 25 of series 5) there is a 1.2 cm intermediate attenuation (32 HU) lesion which may have some internal architecture, considered indeterminate. No  hydroureteronephrosis. Urinary bladder is nearly completely decompressed, but otherwise unremarkable in appearance. Bilateral adrenal glands are normal in appearance. Stomach/Bowel: Stomach is only mildly distended with gas and fluid. Proximal small bowel (duodenum and proximal jejunum) appears relatively decompressed however, just distal to this there is a severely dilated segment of bowel in the left side of the abdomen measuring up to 8.1 cm in diameter, which presumably reflects a portion of the jejunum (axial image 44 of series 2) with a large air-fluid level within it. There is an acute waist like narrowing in the right-side of the abdomen at the distal aspect of this dilated loop of bowel (axial image 51 of series 2), beyond which there are multiple other borderline dilated mildly dilated loops of small bowel, with several air-fluid levels measuring up to 3.1 cm in diameter. The distal aspect of the ileum, and the entire colon otherwise appear completely decompressed. Numerous colonic diverticuli are noted, without definite focal surrounding inflammatory changes to suggest an acute diverticulitis at this time. The appendix is not confidently identified and may be surgically absent. Regardless, there are no inflammatory changes noted adjacent to the cecum to suggest the presence of an acute appendicitis at this time. Vascular/Lymphatic: Atherosclerosis throughout the abdominal aorta and pelvic vasculature, without evidence of aneurysm or dissection. No lymphadenopathy noted in the abdomen or pelvis. Reproductive: Uterus and ovaries are atrophic, but otherwise unremarkable in appearance. Other: No significant volume of ascites.  No pneumoperitoneum. Musculoskeletal: Dextroscoliosis of the lumbar spine. There are no aggressive appearing lytic or blastic lesions noted in the visualized portions of the skeleton. IMPRESSION: 1. Severe small-bowel obstruction, as above, the appearance of which suggests probable  internal hernia. The largest dilated loop of small bowel measures up to 8.1 cm in diameter. Emergent surgical consultation is strongly recommended. 2. Colonic diverticulosis without evidence of acute diverticulitis at this time. 3. Small hiatal hernia. 4. Aortic atherosclerosis, in addition to three-vessel coronary artery disease. Assessment for potential risk factor modification, dietary therapy or pharmacologic therapy may be warranted, if clinically indicated. 5. Small indeterminate lesion in the upper pole of the left kidney measuring 1.2 cm in diameter. Follow-up outpatient nonemergent abdominal MRI with and without IV gadolinium is recommended in the near future after resolution of the patient's acute illness to characterize this lesion. 6. Additional incidental findings, as above. Critical Value/emergent results were called by telephone at the time of interpretation on 12/07/2022 at 6:07 am to provider Geoffery Lyons, who verbally acknowledged these results. Electronically Signed   By: Trudie Reed M.D.   On: 12/07/2022 06:09    Assessment/Plan 1. H/O abdominal surgery Doing well  [place] gwith nosassist Eating No Nausea Wants to go back to her apartment and therapy Discharged her Follow up with Surgeon  2. Wound disruption, sequela Needs Wet To Dry dressing QD  WS Staff will Provid ehe rhelp She will follo wwith Surgery  3. Coronary artery disease involving native coronary artery of native heart without angina pectoris Aspirin and statin  4. Mixed hyperlipidemia Statin and Praluent  5. Hyperthyroidism On Methimazole    Family/ staff Communication:   Labs/tests ordered:

## 2023-01-01 DIAGNOSIS — E0590 Thyrotoxicosis, unspecified without thyrotoxic crisis or storm: Secondary | ICD-10-CM | POA: Diagnosis not present

## 2023-01-01 DIAGNOSIS — E785 Hyperlipidemia, unspecified: Secondary | ICD-10-CM | POA: Diagnosis not present

## 2023-01-01 DIAGNOSIS — I25810 Atherosclerosis of coronary artery bypass graft(s) without angina pectoris: Secondary | ICD-10-CM | POA: Diagnosis not present

## 2023-01-01 DIAGNOSIS — K56609 Unspecified intestinal obstruction, unspecified as to partial versus complete obstruction: Secondary | ICD-10-CM | POA: Diagnosis not present

## 2023-01-01 DIAGNOSIS — I700 Atherosclerosis of aorta: Secondary | ICD-10-CM | POA: Diagnosis not present

## 2023-02-06 ENCOUNTER — Other Ambulatory Visit: Payer: Self-pay | Admitting: Adult Health

## 2023-02-06 MED ORDER — SILVER NITRATE-POT NITRATE 75-25 % EX MISC: CUTANEOUS | 0 refills | Status: DC

## 2023-02-10 ENCOUNTER — Encounter: Payer: Self-pay | Admitting: Internal Medicine

## 2023-02-10 ENCOUNTER — Non-Acute Institutional Stay: Payer: Medicare Other | Admitting: Internal Medicine

## 2023-02-10 VITALS — BP 132/70 | HR 76 | Temp 97.8°F | Resp 16 | Ht 62.0 in | Wt 97.4 lb

## 2023-02-10 DIAGNOSIS — I2510 Atherosclerotic heart disease of native coronary artery without angina pectoris: Secondary | ICD-10-CM

## 2023-02-10 DIAGNOSIS — T8130XS Disruption of wound, unspecified, sequela: Secondary | ICD-10-CM

## 2023-02-10 DIAGNOSIS — I10 Essential (primary) hypertension: Secondary | ICD-10-CM

## 2023-02-10 DIAGNOSIS — E782 Mixed hyperlipidemia: Secondary | ICD-10-CM | POA: Diagnosis not present

## 2023-02-10 DIAGNOSIS — Z9889 Other specified postprocedural states: Secondary | ICD-10-CM

## 2023-02-10 DIAGNOSIS — E0590 Thyrotoxicosis, unspecified without thyrotoxic crisis or storm: Secondary | ICD-10-CM

## 2023-02-10 NOTE — Progress Notes (Signed)
Location:  [facility] features editor of Service:  Clinic (12)  Provider:   Code Status:  Goals of Care:     02/10/2023    9:05 AM  Advanced Directives  Does Patient Have a Medical Advance Directive? Yes  Type of Advance Directive Out of facility DNR (pink MOST or yellow form);Living will  Does patient want to make changes to medical advance directive? No - Patient declined     Chief Complaint  Patient presents with   New Patient (Initial Visit)    Patient here to establish care     HPI: Patient is a 77 y.o. female seen today for medical management of chronic diseases.    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  S/p right colectomy Followed by Wound dehiscence With wound has healed but she does have a small area below her bellybutton which has a lot of granulation  tissue. Which is hindering the complete healing Hypertension Patient has taken herself off the Lopressor because she had low blood pressures HLD and herself off the.Praluent  only takes the statin  [place] lives in [place] [facility] Her husband who was at the [facility] passed away in 03-09-2019.  She has no children her niece who lives in [place] is her POA.  Patient is very independent Past Medical History:  Diagnosis Date   Arthritis    "qwhere" (02/26/2018)   Atrial septal aneurysm    Cancer (HCC)    "chest" (02/26/2018)   Coronary artery disease    Graves' disease    High cholesterol    Mild carotid artery disease (HCC)    Mild mitral regurgitation    Thyroid disease     Past Surgical History:  Procedure Laterality Date   ANTERIOR CRUCIATE LIGAMENT REPAIR Left    BASAL CELL CARCINOMA EXCISION     "chest" (02/26/2018)   CARDIAC CATHETERIZATION     CARPAL  TUNNEL RELEASE Right    CATARACT EXTRACTION W/ INTRAOCULAR LENS  IMPLANT, BILATERAL Bilateral    CORONARY ARTERY BYPASS GRAFT N/A 03/02/2018   Procedure: CORONARY ARTERY BYPASS GRAFTING (CABG);  Surgeon: Delight Ovens, MD;  Location: [facility] OR;  Service: Open Heart Surgery;  Laterality: N/A;  Times 3 using left internal mammary artery to LAD and endoscopically harvested left saphenous vein to Diagonal and PDA.   DILATION AND CURETTAGE OF UTERUS     LAPAROTOMY N/A 12/07/2022   Procedure: OPEN RIGHT COLECTOMY;  Surgeon: Quentin Ore, MD;  Location: [facility] OR;  Service: General;  Laterality: N/A;   LEFT HEART CATH AND CORONARY ANGIOGRAPHY N/A 02/26/2018   Procedure: LEFT HEART CATH AND CORONARY ANGIOGRAPHY;  Surgeon: Marykay Lex, MD;  Location: [facility] INVASIVE CV LAB;  Service: Cardiovascular;  Laterality: N/A;   RIGHT OOPHORECTOMY Right 1970s   SHOULDER ARTHROSCOPY W/ ROTATOR CUFF REPAIR Bilateral    TEE WITHOUT CARDIOVERSION N/A 03/02/2018   Procedure: TRANSESOPHAGEAL ECHOCARDIOGRAM (TEE);  Surgeon: Delight Ovens, MD;  Location: [facility] OR;  Service: Open Heart Surgery;  Laterality: N/A;   TONSILLECTOMY     TRIGGER FINGER RELEASE Bilateral    "8 of them"    Allergies  Allergen Reactions   Atorvastatin Other (See Comments)    Muscle and joint aches   Simvastatin Other (See Comments)  Muscle and joint aches   Rosuvastatin Other (See Comments)    Muscle and joint aches    Outpatient Encounter Medications as of 02/10/2023  Medication Sig   aspirin 81 MG tablet Take 81 mg by mouth daily.   Calcium Carb-Cholecalciferol (CALCIUM+D3 PO) Take 1 tablet by mouth 2 (two) times daily after a meal.   Cholecalciferol (VITAMIN D-3) 1000 units CAPS Take 1,000 Units by mouth at bedtime.   methimazole (TAPAZOLE) 5 MG tablet Take 5 mg by mouth 4 (four) times a week.   Multiple Vitamins-Calcium (ONE-A-DAY WOMENS FORMULA) TABS Take 1 tablet by mouth daily.   PRALUENT 75 MG/ML SOAJ Inject 1 Syringe into  the skin every 14 (fourteen) days.   pravastatin (PRAVACHOL) 40 MG tablet Take 40 mg by mouth at bedtime.   silver nitrate applicators 75-25 % applicator Apply topically 3 (three) times a week.   [DISCONTINUED] acetaminophen (TYLENOL) 500 MG tablet Take 2 tablets (1,000 mg total) by mouth every 6 (six) hours as needed. (Patient not taking: Reported on 02/10/2023)   [DISCONTINUED] metoprolol tartrate (LOPRESSOR) 25 MG tablet Take 1 tablet (25 mg total) by mouth 2 (two) times daily. (Patient not taking: Reported on 02/10/2023)   No facility-administered encounter medications on file as of 02/10/2023.    Review of Systems:  Review of Systems  Constitutional:  Negative for activity change and appetite change.  HENT: Negative.    Respiratory:  Negative for cough and shortness of breath.   Cardiovascular:  Negative for leg swelling.  Gastrointestinal:  Negative for constipation.  Genitourinary: Negative.   Musculoskeletal:  Negative for arthralgias, gait problem and myalgias.  Skin:  Positive for wound.  Neurological:  Negative for dizziness and weakness.  Psychiatric/Behavioral:  Negative for confusion, dysphoric mood and sleep disturbance.     Health Maintenance  Topic Date Due   Pneumonia Vaccine 40+ Years old (1 of 2 - PCV) Never done   Hepatitis C Screening  Never done   Zoster Vaccines- Shingrix (1 of 2) Never done   DEXA SCAN  10/12/2009   DTaP/Tdap/Td (2 - Td or Tdap) 05/23/2015   COVID-19 Vaccine (3 - 2023-24 season) 05/02/2022   Medicare Annual Wellness (AWV)  06/21/2022   INFLUENZA VACCINE  04/02/2023   HPV VACCINES  Aged Out    Physical Exam: Vitals:   02/10/23 0901  BP: 132/70  Pulse: 76  Resp: 16  Temp: 97.8 F (36.6 C)  TempSrc: Temporal  SpO2: 95%  Weight: 97 lb 6.4 oz (44.2 kg)  Height: 5\' 2"  (1.575 m)   Body mass index is 17.81 kg/m. Physical Exam Vitals reviewed.  Constitutional:      Appearance: Normal appearance.  HENT:     Head: Normocephalic.      Nose: Nose normal.     Mouth/Throat:     Mouth: Mucous membranes are moist.     Pharynx: Oropharynx is clear.  Eyes:     Pupils: Pupils are equal, round, and reactive to light.  Cardiovascular:     Rate and Rhythm: Normal rate and regular rhythm.     Pulses: Normal pulses.     Heart sounds: Normal heart sounds. No murmur heard. Pulmonary:     Effort: Pulmonary effort is normal.     Breath sounds: Normal breath sounds.  Abdominal:     General: Abdomen is flat. Bowel sounds are normal. There is no distension.     Palpations: Abdomen is soft.     Tenderness: There is no abdominal tenderness.  Musculoskeletal:        General: No swelling.     Cervical back: Neck supple.  Skin:    General: Skin is warm.     Comments: Midline Surgical incision Has mostly healed HAS small area in distal part which has granulation tissue  Neurological:     General: No focal deficit present.     Mental Status: She is alert and oriented to person, place, and time.  Psychiatric:        Mood and Affect: Mood normal.        Thought Content: Thought content normal.     Labs reviewed: Basic Metabolic Panel: Recent Labs    12/13/22 0610 12/14/22 0352 12/15/22 0514  NA 130* 130* 130*  K 3.6 3.9 4.4  CL 96* 100 96*  CO2 26 25 24   GLUCOSE 131* 140* 120*  BUN 9 8 14   CREATININE 0.51 0.52 0.48  CALCIUM 7.8* 7.7* 8.5*  MG 1.8 2.0 1.9  PHOS 2.5 3.5 4.1   Liver Function Tests: Recent Labs    12/13/22 0610 12/14/22 0352 12/15/22 0514  AST 52* 27 29  ALT 73* 51* 46*  ALKPHOS 43 39 38  BILITOT 0.6 0.4 0.3  PROT 5.1* 4.9* 5.4*  ALBUMIN 2.7* 2.5* 2.7*   Recent Labs    12/07/22 0340  LIPASE 50   No results for input(s): "AMMONIA" in the last 8760 hours. CBC: Recent Labs    12/07/22 0340 12/07/22 1552 12/12/22 0432 12/13/22 0610 12/14/22 0352  WBC 17.4*   < > 10.0 9.7 9.4  NEUTROABS 14.6*  --   --   --   --   HGB 16.2*   < > 13.0 11.6* 11.1*  HCT 47.0*   < > 38.1 32.5* 32.4*  MCV  97.1   < > 96.5 94.5 97.3  PLT 291   < > 308 313 353   < > = values in this interval not displayed.   Lipid Panel: Recent Labs    12/13/22 0610 12/15/22 0514  TRIG 94 80   Lab Results  Component Value Date   HGBA1C 5.4 03/02/2018    Procedures since last visit: No results found.  Assessment/Plan 1. H/O abdominal surgery Doing well No Pain or discomfort  Gaining Weight Bowels are moving well 2. Wound disruption, sequela I did shave  off some of her granulating tissue Facility nurse is waiting for silver nitrate sticks to burn more tissue to enhance healing 3. Coronary artery disease  Wants referral to Cardiology here in [place] due to convenience On Aspirin and Statin Took herself off Lopressor due to Low BP  4. Mixed hyperlipidemia Is not taking Praluent Wants her Lipid followed before restarting it On Statin already  5. Hyperthyroidism Check her Thyroid Levels  6. Primary hypertension BP stays good at home Not taking Lopressor due to low BP Told to check it once a week     Labs/tests ordered:  CBC,CMP,TSH, Free T 3 and T 4  Lipid Panel Next appt:  06/16/2023

## 2023-02-19 DIAGNOSIS — I10 Essential (primary) hypertension: Secondary | ICD-10-CM | POA: Diagnosis not present

## 2023-02-19 DIAGNOSIS — E785 Hyperlipidemia, unspecified: Secondary | ICD-10-CM | POA: Diagnosis not present

## 2023-02-19 DIAGNOSIS — E039 Hypothyroidism, unspecified: Secondary | ICD-10-CM | POA: Diagnosis not present

## 2023-02-19 LAB — BASIC METABOLIC PANEL
BUN: 15 (ref 4–21)
CO2: 24 — AB (ref 13–22)
Chloride: 101 (ref 99–108)
Creatinine: 0.7 (ref 0.5–1.1)
Glucose: 97
Potassium: 4.3 mEq/L (ref 3.5–5.1)
Sodium: 138 (ref 137–147)

## 2023-02-19 LAB — COMPREHENSIVE METABOLIC PANEL
Albumin: 4.7 (ref 3.5–5.0)
Calcium: 9.7 (ref 8.7–10.7)
Globulin: 2.3
eGFR: 86

## 2023-02-19 LAB — LIPID PANEL
Cholesterol: 191 (ref 0–200)
HDL: 67 (ref 35–70)
LDL Cholesterol: 105
LDl/HDL Ratio: 2.9
Triglycerides: 93 (ref 40–160)

## 2023-02-19 LAB — HEPATIC FUNCTION PANEL
ALT: 20 U/L (ref 7–35)
AST: 30 (ref 13–35)
Alkaline Phosphatase: 66 (ref 25–125)
Bilirubin, Total: 0.3

## 2023-02-19 LAB — TSH: TSH: 3.47 (ref 0.41–5.90)

## 2023-03-03 DIAGNOSIS — M25762 Osteophyte, left knee: Secondary | ICD-10-CM | POA: Diagnosis not present

## 2023-03-03 DIAGNOSIS — M1712 Unilateral primary osteoarthritis, left knee: Secondary | ICD-10-CM | POA: Diagnosis not present

## 2023-03-03 DIAGNOSIS — M25562 Pain in left knee: Secondary | ICD-10-CM | POA: Diagnosis not present

## 2023-04-03 ENCOUNTER — Encounter (HOSPITAL_BASED_OUTPATIENT_CLINIC_OR_DEPARTMENT_OTHER): Payer: Self-pay | Admitting: Cardiology

## 2023-04-03 ENCOUNTER — Ambulatory Visit (INDEPENDENT_AMBULATORY_CARE_PROVIDER_SITE_OTHER): Payer: Medicare Other | Admitting: Cardiology

## 2023-04-03 VITALS — BP 132/73 | HR 73 | Ht 62.0 in | Wt 96.4 lb

## 2023-04-03 DIAGNOSIS — G720 Drug-induced myopathy: Secondary | ICD-10-CM

## 2023-04-03 DIAGNOSIS — I2510 Atherosclerotic heart disease of native coronary artery without angina pectoris: Secondary | ICD-10-CM | POA: Diagnosis not present

## 2023-04-03 DIAGNOSIS — E7800 Pure hypercholesterolemia, unspecified: Secondary | ICD-10-CM

## 2023-04-03 DIAGNOSIS — Z951 Presence of aortocoronary bypass graft: Secondary | ICD-10-CM

## 2023-04-03 DIAGNOSIS — T466X5A Adverse effect of antihyperlipidemic and antiarteriosclerotic drugs, initial encounter: Secondary | ICD-10-CM | POA: Diagnosis not present

## 2023-04-03 DIAGNOSIS — Z7189 Other specified counseling: Secondary | ICD-10-CM

## 2023-04-03 NOTE — Patient Instructions (Signed)
Medication Instructions:  Your physician recommends that you continue on your current medications as directed. Please refer to the Current Medication list given to you today.  *If you need a refill on your cardiac medications before your next appointment, please call your pharmacy*   Follow-Up: At [facility], you and your health needs are our priority.  As part of our continuing mission to provide you with exceptional heart care, we have created designated Provider Care Teams.  These Care Teams include your primary Cardiologist (physician) and Advanced Practice Providers (APPs -  Physician Assistants and Nurse Practitioners) who all work together to provide you with the care you need, when you need it.  We recommend signing up for the patient portal called "MyChart".  Sign up information is provided on this After Visit Summary.  MyChart is used to connect with patients for Virtual Visits (Telemedicine).  Patients are able to view lab/test results, encounter notes, upcoming appointments, etc.  Non-urgent messages can be sent to your provider as well.   To learn more about what you can do with MyChart, go to [email or web address].    Your next appointment:   1 year with Dr. Cristal Deer

## 2023-04-03 NOTE — Progress Notes (Signed)
Cardiology Office Note:  .    Date:  04/03/2023  ID:  Deborah [name]. Deborah [name], Deborah [name] 01/20/1946, MRN [id number] PCP: Mahlon Gammon, MD  [facility] Providers Cardiologist:  Jodelle Red, MD     History of Present Illness: .    Deborah [name] is a 77 y.o. female with a hx of CAD with NSTEMI 01/2018 s/p CABG (LIMA-LAD, SVG-Dx, SVG-PDA), atrial septal aneurysm, mild carotid artery disease by duplex 2019, hyperlipidemia, hyperthyroidism, osteoarthritis, here for the evaluation of CAD  Last seen in cardiology by Ronie Spies, PA-C 08/06/2022 and was doing well. Repeat echocardiogram 09/2022 showed LVEF 60-65%, normal diastolic parameters, mild to moderate mitral valve regurgitation, mild aortic regurgitation, borderline dilatation of the ascending aorta, measuring 39 mm. Carotid dopplers 09/2022 revealed non-hemodynamically significant plaque <50% in the left CCA, and minimal wall thickening or plaque bilaterally.   Was admitted to the hospital 12/07/2022 for small bowel obstruction and underwent open right colectomy. Seen by Dr. Chales Abrahams 02/10/2023. She continues to recover from her surgery. Still has some abdominal bloating/discomfort.   Cardiovascular risk factors: Prior clinical ASCVD: CAD with NSTEMI 01/2018 s/p CABG (LIMA-LAD, SVG-Dx, SVG-PDA) Comorbid conditions: Hypertension - Had taken herself off Lopressor due to low blood pressures. Hyperlipidemia - On pravastatin; stopped her praluent. Intolerant of higher intensity statins in the past. LDL 105 in 01/2023. Hyperthyroidism - followed by Dr. Clelia Croft, on Methimazole. Metabolic syndrome/Obesity: Current weight 96 lbs. Chronic inflammatory conditions: Osteoarthritis. Tobacco use history: Former smoker. Family history: Hyperlipidemia. Prior pertinent testing and/or incidental findings: Echocardiogram 09/2022, Carotid Duplex 09/2022. Exercise level: Previously walking 6-8 miles, affected her knee. Lately she has been walking 3-4 miles daily and  consistently without any significant cardiovascular symptoms. Once in a great while may feel breathless after walking up two flights of stairs. Current diet: Enjoys chicken over beef. Usually baked, never fried. More fruits than vegetables, broccoli.   No prior COVID infections.  She denies any palpitations, chest pain, peripheral edema, lightheadedness, headaches, syncope, orthopnea, or PND.  ROS:  Please see the history of present illness. ROS otherwise negative except as noted.  (+) Abdominal bloating/discomfort (+) Hair loss  Studies Reviewed: .         Carotid Dopplers  09/11/2022: Summary:  Right Carotid: The extracranial vessels were near-normal with only minimal wall thickening or plaque.   Left Carotid: Non-hemodynamically significant plaque <50% noted in the CCA. The extracranial vessels were near-normal with only minimal wall thickening or plaque.   Vertebrals:  Bilateral vertebral arteries demonstrate antegrade flow.  Subclavians: Normal flow hemodynamics were seen in bilateral subclavian arteries.   Echo  09/10/2022: Sonographer Comments: Global longitudinal strain was attempted.  IMPRESSIONS   1. Left ventricular ejection fraction, by estimation, is 60 to 65%. The  left ventricle has normal function. The left ventricle has no regional  wall motion abnormalities. Left ventricular diastolic parameters were  normal.   2. Right ventricular systolic function is low normal. The right  ventricular size is normal. There is normal pulmonary artery systolic  pressure. The estimated right ventricular systolic pressure is 25.3 mmHg.   3. The mitral valve is abnormal. Mild to moderate mitral valve  regurgitation.   4. Leaflet tips are sclerotic. The aortic valve is tricuspid. Aortic  valve regurgitation is mild. No aortic stenosis is present. Aortic  regurgitation PHT measures 424 msec.   5. Aortic dilatation noted. There is borderline dilatation of the  ascending aorta,  measuring 39 mm.   6. The inferior vena  cava is dilated in size with >50% respiratory  variability, suggesting right atrial pressure of 8 mmHg.   Comparison(s): Changes from prior study are noted. 02/27/2018: LVEF 60-65%,  trivial AI.   Physical Exam:    VS:  BP 132/73 (BP Location: Left Arm, Patient Position: Sitting, Cuff Size: Normal)   Pulse 73   Ht 5\' 2"  (1.575 m)   Wt 96 lb 6.4 oz (43.7 kg)   SpO2 99%   BMI 17.63 kg/m    Wt Readings from Last 3 Encounters:  04/03/23 96 lb 6.4 oz (43.7 kg)  02/10/23 97 lb 6.4 oz (44.2 kg)  12/19/22 94 lb 3.2 oz (42.7 kg)    GEN: Well nourished, well developed in no acute distress HEENT: Normal, moist mucous membranes NECK: No JVD CARDIAC: regular rhythm, normal S1 and S2, no rubs or gallops. 1/6 soft murmur. VASCULAR: Radial and DP pulses 2+ bilaterally. No carotid bruits RESPIRATORY:  Clear to auscultation without rales, wheezing or rhonchi  ABDOMEN: Soft, non-tender, non-distended MUSCULOSKELETAL:  Ambulates independently SKIN: Warm and dry, no edema, varicosities of bilateral lower extremities. NEUROLOGIC:  Alert and oriented x 3. No focal neuro deficits noted. PSYCHIATRIC:  Normal affect   ASSESSMENT AND PLAN: .    CAD History of CABG Pure hypercholesterolemia History of statin myopathy -continue aspirin, pravastatin -LDL 105, goal <70 -previously LDL was 55 on praluent -discussed alternatives/additional agents. After shared decision making, she will continue on the current medications -reviewed red flag signs that need immediate medical attention  Dispo: Follow-up in 1 year, or sooner as needed.  I,Mathew Stumpf,acting as a Neurosurgeon for [employer], MD.,have documented all relevant documentation on the behalf of Jodelle Red, MD,as directed by  Jodelle Red, MD while in the presence of Jodelle Red, MD.  I, Jodelle Red, MD, have reviewed all documentation for this visit. The  documentation on 05/20/23 for the exam, diagnosis, procedures, and orders are all accurate and complete.   Signed, Jodelle Red, MD

## 2023-05-13 ENCOUNTER — Ambulatory Visit (HOSPITAL_BASED_OUTPATIENT_CLINIC_OR_DEPARTMENT_OTHER): Payer: Medicare Other | Admitting: Cardiology

## 2023-06-05 DIAGNOSIS — Z1231 Encounter for screening mammogram for malignant neoplasm of breast: Secondary | ICD-10-CM | POA: Diagnosis not present

## 2023-06-16 ENCOUNTER — Non-Acute Institutional Stay: Payer: Medicare Other | Admitting: Internal Medicine

## 2023-06-16 ENCOUNTER — Encounter: Payer: Self-pay | Admitting: Internal Medicine

## 2023-06-16 VITALS — BP 120/72 | HR 87 | Temp 98.8°F | Resp 17 | Ht 62.0 in | Wt 98.8 lb

## 2023-06-16 DIAGNOSIS — E2839 Other primary ovarian failure: Secondary | ICD-10-CM | POA: Diagnosis not present

## 2023-06-16 DIAGNOSIS — E782 Mixed hyperlipidemia: Secondary | ICD-10-CM

## 2023-06-16 DIAGNOSIS — Z9889 Other specified postprocedural states: Secondary | ICD-10-CM | POA: Diagnosis not present

## 2023-06-16 DIAGNOSIS — E0590 Thyrotoxicosis, unspecified without thyrotoxic crisis or storm: Secondary | ICD-10-CM | POA: Diagnosis not present

## 2023-06-16 DIAGNOSIS — I2510 Atherosclerotic heart disease of native coronary artery without angina pectoris: Secondary | ICD-10-CM | POA: Diagnosis not present

## 2023-06-16 NOTE — Progress Notes (Unsigned)
Location:  [facility] features editor of Service:  Clinic (12)  Provider:   Code Status: *** Goals of Care:     02/10/2023    9:05 AM  Advanced Directives  Does Patient Have a Medical Advance Directive? Yes  Type of Advance Directive Out of facility DNR (pink MOST or yellow form);Living will  Does patient want to make changes to medical advance directive? No - Patient declined     Chief Complaint  Patient presents with  . Medical Management of Chronic Issues    Patient is here for a 4 month follow up and medication refill     HPI: Patient is a 77 y.o. female seen today for medical management of chronic diseases.     Past Medical History:  Diagnosis Date  . Arthritis    "qwhere" (02/26/2018)  . Atrial septal aneurysm   . Cancer [facility])    "chest" (02/26/2018)  . Coronary artery disease   . Graves' disease   . High cholesterol   . Mild carotid artery disease (HCC)   . Mild mitral regurgitation   . Thyroid disease     Past Surgical History:  Procedure Laterality Date  . ANTERIOR CRUCIATE LIGAMENT REPAIR Left   . BASAL CELL CARCINOMA EXCISION     "chest" (02/26/2018)  . CARDIAC CATHETERIZATION    . CARPAL TUNNEL RELEASE Right   . CATARACT EXTRACTION W/ INTRAOCULAR LENS  IMPLANT, BILATERAL Bilateral   . CORONARY ARTERY BYPASS GRAFT N/A 03/02/2018   Procedure: CORONARY ARTERY BYPASS GRAFTING (CABG);  Surgeon: Delight Ovens, MD;  Location: [facility] OR;  Service: Open Heart Surgery;  Laterality: N/A;  Times 3 using left internal mammary artery to LAD and endoscopically harvested left saphenous vein to Diagonal and PDA.  Marland Kitchen DILATION AND CURETTAGE OF UTERUS    . LAPAROTOMY N/A 12/07/2022   Procedure: OPEN RIGHT COLECTOMY;  Surgeon: Quentin Ore, MD;  Location: [facility] OR;  Service: General;  Laterality: N/A;  . LEFT HEART CATH AND CORONARY ANGIOGRAPHY N/A 02/26/2018   Procedure: LEFT HEART CATH AND CORONARY ANGIOGRAPHY;  Surgeon: Marykay Lex, MD;  Location:  [facility] INVASIVE CV LAB;  Service: Cardiovascular;  Laterality: N/A;  . RIGHT OOPHORECTOMY Right 1970s  . SHOULDER ARTHROSCOPY W/ ROTATOR CUFF REPAIR Bilateral   . TEE WITHOUT CARDIOVERSION N/A 03/02/2018   Procedure: TRANSESOPHAGEAL ECHOCARDIOGRAM (TEE);  Surgeon: Delight Ovens, MD;  Location: [facility] OR;  Service: Open Heart Surgery;  Laterality: N/A;  . TONSILLECTOMY    . TRIGGER FINGER RELEASE Bilateral    "8 of them"    Allergies  Allergen Reactions  . Atorvastatin Other (See Comments)    Muscle and joint aches  . Simvastatin Other (See Comments)    Muscle and joint aches  . Rosuvastatin Other (See Comments)    Muscle and joint aches    Outpatient Encounter Medications as of 06/16/2023  Medication Sig  . aspirin 81 MG tablet Take 81 mg by mouth daily.  . Calcium Carb-Cholecalciferol (CALCIUM+D3 PO) Take 1 tablet by mouth 2 (two) times daily after a meal.  . Cholecalciferol (VITAMIN D-3) 1000 units CAPS Take 1,000 Units by mouth at bedtime.  . methimazole (TAPAZOLE) 5 MG tablet Take 5 mg by mouth 4 (four) times a week.  . Multiple Vitamins-Calcium (ONE-A-DAY WOMENS FORMULA) TABS Take 1 tablet by mouth daily.  . pravastatin (PRAVACHOL) 40 MG tablet Take 40 mg by mouth at bedtime.   No facility-administered encounter medications on file as of  06/16/2023.    Review of Systems:  Review of Systems  Health Maintenance  Topic Date Due  . Hepatitis C Screening  Never done  . Zoster Vaccines- Shingrix (1 of 2) 10/12/1994  . INFLUENZA VACCINE  04/02/2023  . COVID-19 Vaccine (5 - 2023-24 season) 05/03/2023  . Medicare Annual Wellness (AWV)  07/10/2023  . DTaP/Tdap/Td (5 - Td or Tdap) 12/30/2023  . Pneumonia Vaccine 50+ Years old  Completed  . DEXA SCAN  Completed  . HPV VACCINES  Aged Out    Physical Exam: Vitals:   06/16/23 1044  BP: 120/72  Pulse: 87  Resp: 17  Temp: 98.8 F (37.1 C)  TempSrc: Temporal  SpO2: 99%  Weight: 98 lb 12.8 oz (44.8 kg)  Height: 5\' 2"  (1.575 m)    Body mass index is 18.07 kg/m. Physical Exam  Labs reviewed: Basic Metabolic Panel: Recent Labs    12/13/22 0610 12/14/22 0352 12/15/22 0514 02/19/23 0000  NA 130* 130* 130* 138  K 3.6 3.9 4.4 4.3  CL 96* 100 96* 101  CO2 26 25 24  24*  GLUCOSE 131* 140* 120*  --   BUN 9 8 14 15   CREATININE 0.51 0.52 0.48 0.7  CALCIUM 7.8* 7.7* 8.5* 9.7  MG 1.8 2.0 1.9  --   PHOS 2.5 3.5 4.1  --   TSH  --   --   --  3.47   Liver Function Tests: Recent Labs    12/13/22 0610 12/14/22 0352 12/15/22 0514 02/19/23 0000  AST 52* 27 29 30   ALT 73* 51* 46* 20  ALKPHOS 43 39 38 66  BILITOT 0.6 0.4 0.3  --   PROT 5.1* 4.9* 5.4*  --   ALBUMIN 2.7* 2.5* 2.7* 4.7   Recent Labs    12/07/22 0340  LIPASE 50   No results for input(s): "AMMONIA" in the last 8760 hours. CBC: Recent Labs    12/07/22 0340 12/07/22 1552 12/12/22 0432 12/13/22 0610 12/14/22 0352  WBC 17.4*   < > 10.0 9.7 9.4  NEUTROABS 14.6*  --   --   --   --   HGB 16.2*   < > 13.0 11.6* 11.1*  HCT 47.0*   < > 38.1 32.5* 32.4*  MCV 97.1   < > 96.5 94.5 97.3  PLT 291   < > 308 313 353   < > = values in this interval not displayed.   Lipid Panel: Recent Labs    12/13/22 0610 12/15/22 0514 02/19/23 0000  CHOL  --   --  191  HDL  --   --  67  LDLCALC  --   --  105  TRIG 94 80 93   Lab Results  Component Value Date   HGBA1C 5.4 03/02/2018    Procedures since last visit: No results found.  Assessment/Plan There are no diagnoses linked to this encounter.   Labs/tests ordered:  * No order type specified * Next appt:  07/06/2023

## 2023-06-30 ENCOUNTER — Other Ambulatory Visit: Payer: Self-pay

## 2023-06-30 MED ORDER — METHIMAZOLE 5 MG PO TABS: 5.0000 mg | ORAL_TABLET | ORAL | 1 refills | Status: DC

## 2023-06-30 MED ORDER — PRAVASTATIN SODIUM 40 MG PO TABS: 40.0000 mg | ORAL_TABLET | Freq: Every day | ORAL | 1 refills | Status: DC

## 2023-06-30 NOTE — Telephone Encounter (Signed)
High risk or very high risk warning populated when attempting to refill medication. RX request sent to PCP for review and approval if warranted.

## 2023-07-01 DIAGNOSIS — Z23 Encounter for immunization: Secondary | ICD-10-CM | POA: Diagnosis not present

## 2023-07-06 ENCOUNTER — Non-Acute Institutional Stay (INDEPENDENT_AMBULATORY_CARE_PROVIDER_SITE_OTHER): Payer: Medicare Other | Admitting: Adult Health

## 2023-07-06 ENCOUNTER — Encounter: Payer: Self-pay | Admitting: Adult Health

## 2023-07-06 VITALS — BP 128/72 | HR 73 | Temp 98.0°F | Resp 16 | Ht 62.0 in | Wt 101.2 lb

## 2023-07-06 DIAGNOSIS — Z0000 Encounter for general adult medical examination without abnormal findings: Secondary | ICD-10-CM

## 2023-07-06 NOTE — Patient Instructions (Addendum)
Deborah [name] , Thank you for taking time to come for your Medicare Wellness Visit. I appreciate your ongoing commitment to your health goals. Please review the following plan we discussed and let me know if I can assist you in the future.   Screening recommendations/referrals: Colonoscopy aged out Mammogram up to date Bone Density Ordered   Recommended yearly ophthalmology/optometry visit for glaucoma screening and checkup Recommended yearly dental visit for hygiene and checkup  Vaccinations: Influenza vaccine- due annually in September/October Pneumococcal vaccine Prevnar 20 recommended  Tdap vaccine up to date  Shingles vaccine Shingrix recommended     Advanced directives: reviewed   Conditions/risks identified: Cardiac risk   Next appointment: 1 year    Preventive Care 65 Years and Older, Female Preventive care refers to lifestyle choices and visits with your health care provider that can promote health and wellness. What does preventive care include? A yearly physical exam. This is also called an annual well check. Dental exams once or twice a year. Routine eye exams. Ask your health care provider how often you should have your eyes checked. Personal lifestyle choices, including: Daily care of your teeth and gums. Regular physical activity. Eating a healthy diet. Avoiding tobacco and drug use. Limiting alcohol use. Practicing safe sex. Taking low-dose aspirin every day. Taking vitamin and mineral supplements as recommended by your health care provider. What happens during an annual well check? The services and screenings done by your health care provider during your annual well check will depend on your age, overall health, lifestyle risk factors, and family history of disease. Counseling  Your health care provider may ask you questions about your: Alcohol use. Tobacco use. Drug use. Emotional well-being. Home and relationship well-being. Sexual activity. Eating  habits. History of falls. Memory and ability to understand (cognition). Work and work Astronomer. Reproductive health. Screening  You may have the following tests or measurements: Height, weight, and BMI. Blood pressure. Lipid and cholesterol levels. These may be checked every 5 years, or more frequently if you are over 63 years old. Skin check. Lung cancer screening. You may have this screening every year starting at age 40 if you have a 30-pack-year history of smoking and currently smoke or have quit within the past 15 years. Fecal occult blood test (FOBT) of the stool. You may have this test every year starting at age 16. Flexible sigmoidoscopy or colonoscopy. You may have a sigmoidoscopy every 5 years or a colonoscopy every 10 years starting at age 47. Hepatitis C blood test. Hepatitis B blood test. Sexually transmitted disease (STD) testing. Diabetes screening. This is done by checking your blood sugar (glucose) after you have not eaten for a while (fasting). You may have this done every 1-3 years. Bone density scan. This is done to screen for osteoporosis. You may have this done starting at age 26. Mammogram. This may be done every 1-2 years. Talk to your health care provider about how often you should have regular mammograms. Talk with your health care provider about your test results, treatment options, and if necessary, the need for more tests. Vaccines  Your health care provider may recommend certain vaccines, such as: Influenza vaccine. This is recommended every year. Tetanus, diphtheria, and acellular pertussis (Tdap, Td) vaccine. You may need a Td booster every 10 years. Zoster vaccine. You may need this after age 55. Pneumococcal 13-valent conjugate (PCV13) vaccine. One dose is recommended after age 66. Pneumococcal polysaccharide (PPSV23) vaccine. One dose is recommended after age 56. Talk to  your health care provider about which screenings and vaccines you need and how  often you need them. This information is not intended to replace advice given to you by your health care provider. Make sure you discuss any questions you have with your health care provider. Document Released: 09/14/2015 Document Revised: 05/07/2016 Document Reviewed: 06/19/2015 Elsevier Interactive Patient Education  2017 [employer].  Fall Prevention in the Home Falls can cause injuries. They can happen to people of all ages. There are many things you can do to make your home safe and to help prevent falls. What can I do on the outside of my home? Regularly fix the edges of walkways and driveways and fix any cracks. Remove anything that might make you trip as you walk through a door, such as a raised step or threshold. Trim any bushes or trees on the path to your home. Use bright outdoor lighting. Clear any walking paths of anything that might make someone trip, such as rocks or tools. Regularly check to see if handrails are loose or broken. Make sure that both sides of any steps have handrails. Any raised decks and porches should have guardrails on the edges. Have any leaves, snow, or ice cleared regularly. Use sand or salt on walking paths during winter. Clean up any spills in your garage right away. This includes oil or grease spills. What can I do in the bathroom? Use night lights. Install grab bars by the toilet and in the tub and shower. Do not use towel bars as grab bars. Use non-skid mats or decals in the tub or shower. If you need to sit down in the shower, use a plastic, non-slip stool. Keep the floor dry. Clean up any water that spills on the floor as soon as it happens. Remove soap buildup in the tub or shower regularly. Attach bath mats securely with double-sided non-slip rug tape. Do not have throw rugs and other things on the floor that can make you trip. What can I do in the bedroom? Use night lights. Make sure that you have a light by your bed that is easy to  reach. Do not use any sheets or blankets that are too big for your bed. They should not hang down onto the floor. Have a firm chair that has side arms. You can use this for support while you get dressed. Do not have throw rugs and other things on the floor that can make you trip. What can I do in the kitchen? Clean up any spills right away. Avoid walking on wet floors. Keep items that you use a lot in easy-to-reach places. If you need to reach something above you, use a strong step stool that has a grab bar. Keep electrical cords out of the way. Do not use floor polish or wax that makes floors slippery. If you must use wax, use non-skid floor wax. Do not have throw rugs and other things on the floor that can make you trip. What can I do with my stairs? Do not leave any items on the stairs. Make sure that there are handrails on both sides of the stairs and use them. Fix handrails that are broken or loose. Make sure that handrails are as long as the stairways. Check any carpeting to make sure that it is firmly attached to the stairs. Fix any carpet that is loose or worn. Avoid having throw rugs at the top or bottom of the stairs. If you do have throw rugs, attach  them to the floor with carpet tape. Make sure that you have a light switch at the top of the stairs and the bottom of the stairs. If you do not have them, ask someone to add them for you. What else can I do to help prevent falls? Wear shoes that: Do not have high heels. Have rubber bottoms. Are comfortable and fit you well. Are closed at the toe. Do not wear sandals. If you use a stepladder: Make sure that it is fully opened. Do not climb a closed stepladder. Make sure that both sides of the stepladder are locked into place. Ask someone to hold it for you, if possible. Clearly mark and make sure that you can see: Any grab bars or handrails. First and last steps. Where the edge of each step is. Use tools that help you move  around (mobility aids) if they are needed. These include: Canes. Walkers. Scooters. Crutches. Turn on the lights when you go into a dark area. Replace any light bulbs as soon as they burn out. Set up your furniture so you have a clear path. Avoid moving your furniture around. If any of your floors are uneven, fix them. If there are any pets around you, be aware of where they are. Review your medicines with your doctor. Some medicines can make you feel dizzy. This can increase your chance of falling. Ask your doctor what other things that you can do to help prevent falls. This information is not intended to replace advice given to you by your health care provider. Make sure you discuss any questions you have with your health care provider. Document Released: 06/14/2009 Document Revised: 01/24/2016 Document Reviewed: 09/22/2014 Elsevier Interactive Patient Education  2017 [employer].

## 2023-07-06 NOTE — Progress Notes (Signed)
Subjective:   Deborah [name]. Adami is a 77 y.o. female who presents for Medicare Annual (Subsequent) preventive examination at [facility].   Visit Complete: In person  Patient Medicare AWV questionnaire was completed by the patient on 07/06/23; I have confirmed that all information answered by patient is correct and no changes since this date.  Cardiac Risk Factors include: advanced age (>101men, >2 women)     Objective:    Today's Vitals   07/06/23 1322  BP: 128/72  Pulse: 73  Resp: 16  Temp: 98 F (36.7 C)  TempSrc: Temporal  SpO2: 99%  Weight: 101 lb 3.2 oz (45.9 kg)  Height: 5\' 2"  (1.575 m)   Body mass index is 18.51 kg/m.     07/06/2023   12:39 PM 02/10/2023    9:05 AM 12/19/2022   11:26 AM 12/07/2022   10:28 AM 12/07/2022    3:31 AM 02/26/2018   10:05 PM  Advanced Directives  Does Patient Have a Medical Advance Directive? Yes Yes Yes Yes Yes Yes  Type of Estate agent of [place];Living will Out of facility DNR (pink MOST or yellow form);Living will Out of facility DNR (pink MOST or yellow form);Living will Living will;Healthcare Power of [place];Out of facility DNR (pink MOST or yellow form)  Healthcare Power of [place];Living will  Does patient want to make changes to medical advance directive? No - Patient declined No - Patient declined No - Patient declined No - Patient declined  No - Patient declined  Copy of Healthcare Power of Attorney in Chart? No - copy requested     No - copy requested    Current Medications (verified) Outpatient Encounter Medications as of 07/06/2023  Medication Sig   aspirin 81 MG tablet Take 81 mg by mouth daily.   Calcium Carb-Cholecalciferol (CALCIUM+D3 PO) Take 1 tablet by mouth 2 (two) times daily after a meal.   Cholecalciferol (VITAMIN D-3) 1000 units CAPS Take 1,000 Units by mouth at bedtime.   methimazole (TAPAZOLE) 5 MG tablet Take 1 tablet (5 mg total) by mouth 4 (four) times a  week.   Multiple Vitamins-Calcium (ONE-A-DAY WOMENS FORMULA) TABS Take 1 tablet by mouth daily.   pravastatin (PRAVACHOL) 40 MG tablet Take 1 tablet (40 mg total) by mouth at bedtime.   No facility-administered encounter medications on file as of 07/06/2023.    Allergies (verified) Atorvastatin, Simvastatin, and Rosuvastatin   History: Past Medical History:  Diagnosis Date   Arthritis    "qwhere" (02/26/2018)   Atrial septal aneurysm    Cancer (HCC)    "chest" (02/26/2018)   Coronary artery disease    Graves' disease    High cholesterol    Mild carotid artery disease (HCC)    Mild mitral regurgitation    Thyroid disease    Past Surgical History:  Procedure Laterality Date   ANTERIOR CRUCIATE LIGAMENT REPAIR Left    BASAL CELL CARCINOMA EXCISION     "chest" (02/26/2018)   CARDIAC CATHETERIZATION     CARPAL TUNNEL RELEASE Right    CATARACT EXTRACTION W/ INTRAOCULAR LENS  IMPLANT, BILATERAL Bilateral    CORONARY ARTERY BYPASS GRAFT N/A 03/02/2018   Procedure: CORONARY ARTERY BYPASS GRAFTING (CABG);  Surgeon: Delight Ovens, MD;  Location: [facility] OR;  Service: Open Heart Surgery;  Laterality: N/A;  Times 3 using left internal mammary artery to LAD and endoscopically harvested left saphenous vein to Diagonal and PDA.   DILATION AND CURETTAGE OF UTERUS  LAPAROTOMY N/A 12/07/2022   Procedure: OPEN RIGHT COLECTOMY;  Surgeon: Quentin Ore, MD;  Location: [facility] OR;  Service: General;  Laterality: N/A;   LEFT HEART CATH AND CORONARY ANGIOGRAPHY N/A 02/26/2018   Procedure: LEFT HEART CATH AND CORONARY ANGIOGRAPHY;  Surgeon: Marykay Lex, MD;  Location: [facility] INVASIVE CV LAB;  Service: Cardiovascular;  Laterality: N/A;   RIGHT OOPHORECTOMY Right 1970s   SHOULDER ARTHROSCOPY W/ ROTATOR CUFF REPAIR Bilateral    TEE WITHOUT CARDIOVERSION N/A 03/02/2018   Procedure: TRANSESOPHAGEAL ECHOCARDIOGRAM (TEE);  Surgeon: Delight Ovens, MD;  Location: [facility] OR;  Service: Open Heart Surgery;   Laterality: N/A;   TONSILLECTOMY     TRIGGER FINGER RELEASE Bilateral    "8 of them"   Family History  Problem Relation Age of Onset   Osteoporosis Mother    CAD Father        1st Mi around age 2   Hypercholesterolemia Father    Hypercholesterolemia Sister    Multiple myeloma Brother    Social History   Socioeconomic History   Marital status: Widowed    Spouse name: John   Number of children: 0   Years of education: Not on file   Highest education level: Not on file  Occupational History   Not on file  Tobacco Use   Smoking status: Former    Current packs/day: 0.50    Average packs/day: 0.5 packs/day for 30.0 years (15.0 ttl pk-yrs)    Types: Cigarettes   Smokeless tobacco: Never   Tobacco comments:    02/26/2018 "nothing since before 2000"  Vaping Use   Vaping status: Never Used  Substance and Sexual Activity   Alcohol use: Yes    Alcohol/week: 6.0 standard drinks of alcohol    Types: 6 Standard drinks or equivalent per week   Drug use: Never   Sexual activity: Not Currently  Other Topics Concern   Not on file  Social History Narrative   Not on file   Social Determinants of Health   Financial Resource Strain: Low Risk  (05/04/2018)   Overall Financial Resource Strain (CARDIA)    Difficulty of Paying Living Expenses: Not hard at all  Food Insecurity: No Food Insecurity (12/07/2022)   Hunger Vital Sign    Worried About Running Out of Food in the Last Year: Never true    Ran Out of Food in the Last Year: Never true  Transportation Needs: No Transportation Needs (12/07/2022)   PRAPARE - Administrator, Civil Service (Medical): No    Lack of Transportation (Non-Medical): No  Physical Activity: Sufficiently Active (05/04/2018)   Exercise Vital Sign    Days of Exercise per Week: 7 days    Minutes of Exercise per Session: 30 min  Stress: Stress Concern Present (05/04/2018)   [employer] of Occupational Health - Occupational Stress Questionnaire     Feeling of Stress : To some extent  Social Connections: Not on file    Tobacco Counseling Counseling given: Not Answered Tobacco comments: 02/26/2018 "nothing since before 2000"   Clinical Intake:     Pain : No/denies pain     BMI - recorded: 18.51 Nutritional Status: BMI <19  Underweight Nutritional Risks: None Diabetes: No  How often do you need to have someone help you when you read instructions, pamphlets, or other written materials from your doctor or pharmacy?: 1 - Never What is the last grade level you completed in school?: college  Interpreter Needed?: No  Activities of Daily Living    07/06/2023    1:41 PM 07/06/2023    1:27 PM  In your present state of health, do you have any difficulty performing the following activities:  Hearing? 1 0  Vision? 1 0  Difficulty concentrating or making decisions? 0 0  Walking or climbing stairs? 0 0  Dressing or bathing? 0 0  Doing errands, shopping?  0  Preparing Food and eating ? N   Using the Toilet? N   In the past six months, have you accidently leaked urine? N   Do you have problems with loss of bowel control? N   Managing your Medications? N   Managing your Finances? N   Housekeeping or managing your Housekeeping? N     Patient Care Team: Mahlon Gammon, MD as PCP - General (Internal Medicine) Jodelle Red, MD as PCP - Cardiology (Cardiology)  Indicate any recent Medical Services you may have received from other than [facility] providers in the past year (date may be approximate).     Assessment:   This is a routine wellness examination for [place].  Hearing/Vision screen Hearing Screening - Comments:: Yes in the last 12 months Vision Screening - Comments:: Yes in the last 12 month   Goals Addressed   None    Depression Screen    07/06/2023   12:39 PM 06/16/2023   10:45 AM 06/16/2023   10:10 AM 02/10/2023    9:05 AM 07/28/2018   10:56 AM 05/10/2018   10:50 AM  PHQ 2/9 Scores  PHQ - 2 Score  0 0 0 0 0 0    Fall Risk    07/06/2023   12:39 PM 06/16/2023   10:45 AM 06/16/2023   10:10 AM 02/10/2023    9:04 AM 05/04/2018    8:57 AM  Fall Risk   Falls in the past year? 0 0 0 0 No  Number falls in past yr: 0 0 0 0   Injury with Fall? 0 0 0 0   Risk for fall due to : No Fall Risks No Fall Risks No Fall Risks No Fall Risks   Follow up Falls evaluation completed Falls evaluation completed Falls evaluation completed Falls evaluation completed     MEDICARE RISK AT HOME: Medicare Risk at Home Any stairs in or around the home?: No If so, are there any without handrails?: No Home free of loose throw rugs in walkways, pet beds, electrical cords, etc?: Yes Adequate lighting in your home to reduce risk of falls?: Yes Life alert?: No Use of a cane, walker or w/c?: No Grab bars in the bathroom?: Yes Shower chair or bench in shower?: Yes Elevated toilet seat or a handicapped toilet?: No  TIMED UP AND GO:  Was the test performed?  Yes  Length of time to ambulate 10 feet: 10 sec Gait steady and fast without use of assistive device    Cognitive Function:    07/06/2023    1:29 PM  MMSE - Mini Mental State Exam  Orientation to time 5  Orientation to Place 5  Registration 3  Attention/ Calculation 5  Recall 3  Language- name 2 objects 2  Language- repeat 1  Language- follow 3 step command 3  Language- read & follow direction 1  Write a sentence 1  Copy design 1  Total score 30        Immunizations Immunization History  Administered Date(s) Administered   Fluad Quad(high Dose 65+) 07/23/2022, 07/01/2023   Influenza  Split 08/29/2009, 05/05/2011, 05/03/2012, 05/02/2013, 06/13/2014   Influenza, High Dose Seasonal PF 06/10/2017   Influenza, Quadrivalent, Recombinant, Inj, Pf 04/26/2018, 04/25/2019   Moderna Sars-Covid-2 Vaccination 09/13/2019, 10/11/2019, 11/01/2019, 06/14/2021   Pneumococcal Conjugate-13 12/13/2013   Pneumococcal Polysaccharide-23 08/29/2009, 09/06/2010    Td (Adult),5 Lf Tetanus Toxid, Preservative Free 08/29/2009, 12/29/2012   Tdap 05/22/2005, 12/29/2013   Zoster, Live 08/29/2009    TDAP status: Up to date  Flu Vaccine status: Up to date  Pneumococcal vaccine status: Up to date  Covid-19 vaccine status: Declined, Education has been provided regarding the importance of this vaccine but patient still declined. Advised may receive this vaccine at local pharmacy or Health Dept.or vaccine clinic. Aware to provide a copy of the vaccination record if obtained from local pharmacy or Health Dept. Verbalized acceptance and understanding.  Qualifies for Shingles Vaccine? Yes   Zostavax completed Yes   Shingrix Completed?: No.    Education has been provided regarding the importance of this vaccine. Patient has been advised to call insurance company to determine out of pocket expense if they have not yet received this vaccine. Advised may also receive vaccine at local pharmacy or Health Dept. Verbalized acceptance and understanding.  Screening Tests Health Maintenance  Topic Date Due   Zoster Vaccines- Shingrix (1 of 2) 10/12/1994   COVID-19 Vaccine (5 - 2023-24 season) 05/03/2023   Hepatitis C Screening  07/05/2024 (Originally 10/12/1963)   DTaP/Tdap/Td (5 - Td or Tdap) 12/30/2023   Medicare Annual Wellness (AWV)  07/05/2024   Pneumonia Vaccine 46+ Years old  Completed   INFLUENZA VACCINE  Completed   DEXA SCAN  Completed   HPV VACCINES  Aged Out    Health Maintenance  Health Maintenance Due  Topic Date Due   Zoster Vaccines- Shingrix (1 of 2) 10/12/1994   COVID-19 Vaccine (5 - 2023-24 season) 05/03/2023    Colorectal cancer screening: No longer required.   Mammogram status: Completed 06/2023. Repeat every year  Bone Density status: Ordered 07/2023. Pt provided with contact info and advised to call to schedule appt.  Lung Cancer Screening: (Low Dose CT Chest recommended if Age 38-80 years, 20 pack-year currently smoking OR have quit  w/in 15years.) does not qualify.  She previously had CT scans screening purposes but was released.    Lung Cancer Screening Referral: NA  Additional Screening:  Hepatitis C Screening: does not qualify; Completed NA  Vision Screening: Recommended annual ophthalmology exams for early detection of glaucoma and other disorders of the eye. Is the patient up to date with their annual eye exam?  Yes  Who is the provider or what is the name of the office in which the patient attends annual eye exams? [facility]  If pt is not established with a provider, would they like to be referred to a provider to establish care? No .   Dental Screening: Recommended annual dental exams for proper oral hygiene  Diabetic Foot Exam:   Community Resource Referral / Chronic Care Management: CRR required this visit?  No   CCM required this visit?  No     Plan:     I have personally reviewed and noted the following in the patient's chart:   Medical and social history Use of alcohol, tobacco or illicit drugs  Current medications and supplements including opioid prescriptions. Patient is not currently taking opioid prescriptions. Functional ability and status Nutritional status Physical activity Advanced directives List of other physicians Hospitalizations, surgeries, and ER visits in previous 12 months Vitals  Screenings to include cognitive, depression, and falls Referrals and appointments  In addition, I have reviewed and discussed with patient certain preventive protocols, quality metrics, and best practice recommendations. A written personalized care plan for preventive services as well as general preventive health recommendations were provided to patient.     Fletcher Anon, NP   07/06/2023   After Visit Summary: Given to patient  Nurse Notes: Mammogram done at [facility]. Will get results. Also need medical release form signed.

## 2023-07-08 DIAGNOSIS — E2839 Other primary ovarian failure: Secondary | ICD-10-CM | POA: Diagnosis not present

## 2023-07-08 DIAGNOSIS — M8588 Other specified disorders of bone density and structure, other site: Secondary | ICD-10-CM | POA: Diagnosis not present

## 2023-07-08 LAB — HM DEXA SCAN

## 2023-07-10 ENCOUNTER — Encounter: Payer: Self-pay | Admitting: Internal Medicine

## 2023-07-13 ENCOUNTER — Encounter: Payer: Self-pay | Admitting: Internal Medicine

## 2023-07-13 ENCOUNTER — Telehealth (HOSPITAL_COMMUNITY): Payer: Self-pay | Admitting: Physician Assistant

## 2023-07-13 NOTE — Telephone Encounter (Signed)
I called patient to schedule echocardiogram ordered and patient states that she is now at [facility] and following Dr. Cristal Deer and will wait for her recommendation to schedule. Order will be removed from the echo WQ.

## 2023-08-11 DIAGNOSIS — M25362 Other instability, left knee: Secondary | ICD-10-CM | POA: Diagnosis not present

## 2023-08-11 DIAGNOSIS — M9905 Segmental and somatic dysfunction of pelvic region: Secondary | ICD-10-CM | POA: Diagnosis not present

## 2023-08-11 DIAGNOSIS — M9906 Segmental and somatic dysfunction of lower extremity: Secondary | ICD-10-CM | POA: Diagnosis not present

## 2023-08-11 DIAGNOSIS — M1712 Unilateral primary osteoarthritis, left knee: Secondary | ICD-10-CM | POA: Diagnosis not present

## 2023-08-11 DIAGNOSIS — M9904 Segmental and somatic dysfunction of sacral region: Secondary | ICD-10-CM | POA: Diagnosis not present

## 2023-08-11 DIAGNOSIS — M25562 Pain in left knee: Secondary | ICD-10-CM | POA: Diagnosis not present

## 2023-09-10 DIAGNOSIS — M9904 Segmental and somatic dysfunction of sacral region: Secondary | ICD-10-CM | POA: Diagnosis not present

## 2023-09-10 DIAGNOSIS — M9905 Segmental and somatic dysfunction of pelvic region: Secondary | ICD-10-CM | POA: Diagnosis not present

## 2023-09-10 DIAGNOSIS — M24662 Ankylosis, left knee: Secondary | ICD-10-CM | POA: Diagnosis not present

## 2023-09-10 DIAGNOSIS — M9906 Segmental and somatic dysfunction of lower extremity: Secondary | ICD-10-CM | POA: Diagnosis not present

## 2023-09-10 DIAGNOSIS — M1712 Unilateral primary osteoarthritis, left knee: Secondary | ICD-10-CM | POA: Diagnosis not present

## 2023-09-10 DIAGNOSIS — M25562 Pain in left knee: Secondary | ICD-10-CM | POA: Diagnosis not present

## 2023-09-14 ENCOUNTER — Other Ambulatory Visit: Payer: Self-pay | Admitting: Internal Medicine

## 2023-09-14 MED ORDER — METHIMAZOLE 5 MG PO TABS: 5.0000 mg | ORAL_TABLET | ORAL | 3 refills | Status: AC

## 2023-09-15 DIAGNOSIS — L821 Other seborrheic keratosis: Secondary | ICD-10-CM | POA: Diagnosis not present

## 2023-09-15 DIAGNOSIS — Z85828 Personal history of other malignant neoplasm of skin: Secondary | ICD-10-CM | POA: Diagnosis not present

## 2023-09-15 DIAGNOSIS — L570 Actinic keratosis: Secondary | ICD-10-CM | POA: Diagnosis not present

## 2023-09-15 DIAGNOSIS — D2272 Melanocytic nevi of left lower limb, including hip: Secondary | ICD-10-CM | POA: Diagnosis not present

## 2023-12-08 DIAGNOSIS — E039 Hypothyroidism, unspecified: Secondary | ICD-10-CM | POA: Diagnosis not present

## 2023-12-08 LAB — LIPID PANEL
Cholesterol: 188 (ref 0–200)
HDL: 62 (ref 35–70)
LDL Cholesterol: 105
Triglycerides: 104 (ref 40–160)

## 2023-12-08 LAB — HEPATIC FUNCTION PANEL
ALT: 22 U/L (ref 7–35)
AST: 29 (ref 13–35)
Alkaline Phosphatase: 74 (ref 25–125)
Bilirubin, Total: 0.3

## 2023-12-08 LAB — BASIC METABOLIC PANEL WITH GFR
BUN: 11 (ref 4–21)
CO2: 24 — AB (ref 13–22)
Chloride: 94 — AB (ref 99–108)
Creatinine: 0.6 (ref 0.5–1.1)
Glucose: 84
Potassium: 4.1 meq/L (ref 3.5–5.1)
Sodium: 131 — AB (ref 137–147)

## 2023-12-08 LAB — CBC AND DIFFERENTIAL
HCT: 38 (ref 36–46)
Hemoglobin: 13 (ref 12.0–16.0)
Platelets: 252 10*3/uL (ref 150–400)
WBC: 6.7

## 2023-12-08 LAB — COMPREHENSIVE METABOLIC PANEL WITH GFR
Albumin: 4.4 (ref 3.5–5.0)
Calcium: 94 — AB (ref 8.7–10.7)
Globulin: 2.2
eGFR: >90

## 2023-12-08 LAB — CBC: RBC: 3.93 (ref 3.87–5.11)

## 2023-12-08 LAB — TSH: TSH: 3.39 (ref 0.41–5.90)

## 2023-12-09 ENCOUNTER — Encounter: Payer: Self-pay | Admitting: Internal Medicine

## 2023-12-10 DIAGNOSIS — I10 Essential (primary) hypertension: Secondary | ICD-10-CM | POA: Diagnosis not present

## 2023-12-10 DIAGNOSIS — H43393 Other vitreous opacities, bilateral: Secondary | ICD-10-CM | POA: Diagnosis not present

## 2023-12-10 DIAGNOSIS — H43813 Vitreous degeneration, bilateral: Secondary | ICD-10-CM | POA: Diagnosis not present

## 2023-12-10 DIAGNOSIS — H35033 Hypertensive retinopathy, bilateral: Secondary | ICD-10-CM | POA: Diagnosis not present

## 2023-12-15 ENCOUNTER — Encounter: Payer: Self-pay | Admitting: Internal Medicine

## 2023-12-15 ENCOUNTER — Non-Acute Institutional Stay: Payer: Medicare Other | Admitting: Internal Medicine

## 2023-12-15 VITALS — BP 118/74 | HR 76 | Temp 97.9°F | Resp 20 | Ht 62.0 in | Wt 102.2 lb

## 2023-12-15 DIAGNOSIS — I2510 Atherosclerotic heart disease of native coronary artery without angina pectoris: Secondary | ICD-10-CM | POA: Diagnosis not present

## 2023-12-15 DIAGNOSIS — Z9889 Other specified postprocedural states: Secondary | ICD-10-CM | POA: Diagnosis not present

## 2023-12-15 DIAGNOSIS — E0590 Thyrotoxicosis, unspecified without thyrotoxic crisis or storm: Secondary | ICD-10-CM | POA: Diagnosis not present

## 2023-12-15 DIAGNOSIS — M8580 Other specified disorders of bone density and structure, unspecified site: Secondary | ICD-10-CM | POA: Diagnosis not present

## 2023-12-15 DIAGNOSIS — Z951 Presence of aortocoronary bypass graft: Secondary | ICD-10-CM | POA: Diagnosis not present

## 2023-12-15 DIAGNOSIS — E782 Mixed hyperlipidemia: Secondary | ICD-10-CM | POA: Diagnosis not present

## 2023-12-15 DIAGNOSIS — Z780 Asymptomatic menopausal state: Secondary | ICD-10-CM | POA: Diagnosis not present

## 2023-12-15 NOTE — Progress Notes (Unsigned)
 Location:  [facility] features editor of Service:  Clinic (12)  Provider:   Code Status: *** Goals of Care:     07/06/2023   12:39 PM  Advanced Directives  Does Patient Have a Medical Advance Directive? Yes  Type of Estate agent of [place];Living will  Does patient want to make changes to medical advance directive? No - Patient declined  Copy of Healthcare Power of Attorney in Chart? No - copy requested     Chief Complaint  Patient presents with   Follow-up    6 month follow up.    HPI: Patient is a 78 y.o. female seen today for medical management of chronic diseases.     Past Medical History:  Diagnosis Date   Arthritis    "qwhere" (02/26/2018)   Atrial septal aneurysm    Cancer (HCC)    "chest" (02/26/2018)   Coronary artery disease    Graves' disease    High cholesterol    Mild carotid artery disease (HCC)    Mild mitral regurgitation    Thyroid disease     Past Surgical History:  Procedure Laterality Date   ANTERIOR CRUCIATE LIGAMENT REPAIR Left    BASAL CELL CARCINOMA EXCISION     "chest" (02/26/2018)   CARDIAC CATHETERIZATION     CARPAL TUNNEL RELEASE Right    CATARACT EXTRACTION W/ INTRAOCULAR LENS  IMPLANT, BILATERAL Bilateral    CORONARY ARTERY BYPASS GRAFT N/A 03/02/2018   Procedure: CORONARY ARTERY BYPASS GRAFTING (CABG);  Surgeon: Delight Ovens, MD;  Location: [facility] OR;  Service: Open Heart Surgery;  Laterality: N/A;  Times 3 using left internal mammary artery to LAD and endoscopically harvested left saphenous vein to Diagonal and PDA.   DILATION AND CURETTAGE OF UTERUS     LAPAROTOMY N/A 12/07/2022   Procedure: OPEN RIGHT COLECTOMY;  Surgeon: Quentin Ore, MD;  Location: [facility] OR;  Service: General;  Laterality: N/A;   LEFT HEART CATH AND CORONARY ANGIOGRAPHY N/A 02/26/2018   Procedure: LEFT HEART CATH AND CORONARY ANGIOGRAPHY;  Surgeon: Marykay Lex, MD;  Location: [facility] INVASIVE CV LAB;  Service:  Cardiovascular;  Laterality: N/A;   RIGHT OOPHORECTOMY Right 1970s   SHOULDER ARTHROSCOPY W/ ROTATOR CUFF REPAIR Bilateral    TEE WITHOUT CARDIOVERSION N/A 03/02/2018   Procedure: TRANSESOPHAGEAL ECHOCARDIOGRAM (TEE);  Surgeon: Delight Ovens, MD;  Location: [facility] OR;  Service: Open Heart Surgery;  Laterality: N/A;   TONSILLECTOMY     TRIGGER FINGER RELEASE Bilateral    "8 of them"    Allergies  Allergen Reactions   Atorvastatin Other (See Comments)    Muscle and joint aches   Simvastatin Other (See Comments)    Muscle and joint aches   Rosuvastatin Other (See Comments)    Muscle and joint aches    Outpatient Encounter Medications as of 12/15/2023  Medication Sig   aspirin 81 MG tablet Take 81 mg by mouth daily.   Calcium Carb-Cholecalciferol (CALCIUM+D3 PO) Take 1 tablet by mouth 2 (two) times daily after a meal.   Cholecalciferol (VITAMIN D-3) 1000 units CAPS Take 1,000 Units by mouth at bedtime.   methimazole (TAPAZOLE) 5 MG tablet Take 1 tablet (5 mg total) by mouth 4 (four) times a week.   Multiple Vitamins-Calcium (ONE-A-DAY WOMENS FORMULA) TABS Take 1 tablet by mouth daily.   pravastatin (PRAVACHOL) 40 MG tablet Take 1 tablet (40 mg total) by mouth at bedtime.   No facility-administered encounter medications on file as of  12/15/2023.    Review of Systems:  Review of Systems  Health Maintenance  Topic Date Due   Zoster Vaccines- Shingrix (1 of 2) 10/12/1994   COVID-19 Vaccine (5 - 2024-25 season) 06/14/2024 (Originally 05/03/2023)   Hepatitis C Screening  07/05/2024 (Originally 10/12/1963)   DTaP/Tdap/Td (5 - Td or Tdap) 12/30/2023   INFLUENZA VACCINE  04/01/2024   Medicare Annual Wellness (AWV)  07/05/2024   Pneumonia Vaccine 30+ Years old  Completed   DEXA SCAN  Completed   HPV VACCINES  Aged Out   Meningococcal B Vaccine  Aged Out    Physical Exam: Vitals:   12/15/23 0838  Height: 5\' 2"  (1.575 m)   Body mass index is 18.51 kg/m. Physical Exam  Labs  reviewed: Basic Metabolic Panel: Recent Labs    02/19/23 0000 12/08/23 0000  NA 138 131*  K 4.3 4.1  CL 101 94*  CO2 24* 24*  BUN 15 11  CREATININE 0.7 0.6  CALCIUM 9.7 94.0*  TSH 3.47 3.39   Liver Function Tests: Recent Labs    02/19/23 0000 12/08/23 0000  AST 30 29  ALT 20 22  ALKPHOS 66 74  ALBUMIN 4.7 4.4   No results for input(s): "LIPASE", "AMYLASE" in the last 8760 hours. No results for input(s): "AMMONIA" in the last 8760 hours. CBC: Recent Labs    12/08/23 1226  WBC 6.7  HGB 13.0  HCT 38  PLT 252   Lipid Panel: Recent Labs    02/19/23 0000 12/08/23 0000  CHOL 191 188  HDL 67 62  LDLCALC 105 105  TRIG 93 104   Lab Results  Component Value Date   HGBA1C 5.4 03/02/2018    Procedures since last visit: No results found.  Assessment/Plan 1. Mixed hyperlipidemia (Primary) ***  2. Hyperthyroidism ***  3. Coronary artery disease involving native coronary artery of native heart without angina pectoris ***  4. H/O abdominal surgery ***  5. Osteopenia after menopause ***  6. Hx of CABG 2019 ***    Labs/tests ordered:  * No order type specified * Next appt:  Visit date not found

## 2023-12-15 NOTE — Patient Instructions (Signed)
 Take Colace every day Can add Prunes in the morning

## 2024-03-15 ENCOUNTER — Encounter: Payer: Self-pay | Admitting: Internal Medicine

## 2024-03-15 ENCOUNTER — Non-Acute Institutional Stay: Admitting: Internal Medicine

## 2024-03-15 VITALS — BP 122/62 | HR 75 | Temp 98.0°F | Ht 62.0 in | Wt 105.8 lb

## 2024-03-15 DIAGNOSIS — Z9049 Acquired absence of other specified parts of digestive tract: Secondary | ICD-10-CM

## 2024-03-15 DIAGNOSIS — R1030 Lower abdominal pain, unspecified: Secondary | ICD-10-CM | POA: Diagnosis not present

## 2024-03-15 DIAGNOSIS — J310 Chronic rhinitis: Secondary | ICD-10-CM | POA: Diagnosis not present

## 2024-03-15 NOTE — Patient Instructions (Signed)
 Can use Flonase/Nasocort one spray at night for 3 weeks to see if it helps the Nasal Congestion

## 2024-03-15 NOTE — Progress Notes (Unsigned)
 Location: [facility] features editor of Service:  Clinic (12)  Provider:   Code Status:  Goals of Care:     07/06/2023   12:39 PM  Advanced Directives  Does Patient Have a Medical Advance Directive? Yes  Type of Estate agent of [place];Living will  Does patient want to make changes to medical advance directive? No - Patient declined  Copy of Healthcare Power of Attorney in Chart? No - copy requested     Chief Complaint  Patient presents with  . GI Problem    Patient states feeling bloated and having some nausea along with abnormal stool.    HPI: Patient is a 78 y.o. female seen today for an acute visit for N  Past Medical History:  Diagnosis Date  . Arthritis    qwhere (02/26/2018)  . Atrial septal aneurysm   . Cancer (HCC)    chest (02/26/2018)  . Coronary artery disease   . Graves' disease   . High cholesterol   . Mild carotid artery disease (HCC)   . Mild mitral regurgitation   . Thyroid  disease     Past Surgical History:  Procedure Laterality Date  . ANTERIOR CRUCIATE LIGAMENT REPAIR Left   . BASAL CELL CARCINOMA EXCISION     chest (02/26/2018)  . CARDIAC CATHETERIZATION    . CARPAL TUNNEL RELEASE Right   . CATARACT EXTRACTION W/ INTRAOCULAR LENS  IMPLANT, BILATERAL Bilateral   . CORONARY ARTERY BYPASS GRAFT N/A 03/02/2018   Procedure: CORONARY ARTERY BYPASS GRAFTING (CABG);  Surgeon: Army Dallas NOVAK, MD;  Location: [facility] OR;  Service: Open Heart Surgery;  Laterality: N/A;  Times 3 using left internal mammary artery to LAD and endoscopically harvested left saphenous vein to Diagonal and PDA.  SABRA DILATION AND CURETTAGE OF UTERUS    . LAPAROTOMY N/A 12/07/2022   Procedure: OPEN RIGHT COLECTOMY;  Surgeon: Lyndel Deward PARAS, MD;  Location: [facility] OR;  Service: General;  Laterality: N/A;  . LEFT HEART CATH AND CORONARY ANGIOGRAPHY N/A 02/26/2018   Procedure: LEFT HEART CATH AND CORONARY ANGIOGRAPHY;  Surgeon: Anner Alm ORN,  MD;  Location: [facility] INVASIVE CV LAB;  Service: Cardiovascular;  Laterality: N/A;  . RIGHT OOPHORECTOMY Right 1970s  . SHOULDER ARTHROSCOPY W/ ROTATOR CUFF REPAIR Bilateral   . TEE WITHOUT CARDIOVERSION N/A 03/02/2018   Procedure: TRANSESOPHAGEAL ECHOCARDIOGRAM (TEE);  Surgeon: Army Dallas NOVAK, MD;  Location: [facility] OR;  Service: Open Heart Surgery;  Laterality: N/A;  . TONSILLECTOMY    . TRIGGER FINGER RELEASE Bilateral    8 of them    Allergies  Allergen Reactions  . Atorvastatin Other (See Comments)    Muscle and joint aches  . Simvastatin Other (See Comments)    Muscle and joint aches  . Rosuvastatin Other (See Comments)    Muscle and joint aches    Outpatient Encounter Medications as of 03/15/2024  Medication Sig  . aspirin  81 MG tablet Take 81 mg by mouth daily.  . Calcium Carb-Cholecalciferol (CALCIUM+D3 PO) Take 1 tablet by mouth 2 (two) times daily after a meal.  . Cholecalciferol (VITAMIN D-3) 1000 units CAPS Take 1,000 Units by mouth at bedtime.  . methimazole  (TAPAZOLE ) 5 MG tablet Take 1 tablet (5 mg total) by mouth 4 (four) times a week.  . Multiple Vitamins-Calcium (ONE-A-DAY WOMENS FORMULA) TABS Take 1 tablet by mouth daily.  . pravastatin  (PRAVACHOL ) 40 MG tablet Take 1 tablet (40 mg total) by mouth at bedtime.   No facility-administered encounter  medications on file as of 03/15/2024.    Review of Systems:  Review of Systems  Health Maintenance  Topic Date Due  . DTaP/Tdap/Td (5 - Td or Tdap) 12/30/2023  . COVID-19 Vaccine (5 - 2024-25 season) 06/14/2024 (Originally 05/03/2023)  . Hepatitis C Screening  07/05/2024 (Originally 10/12/1963)  . INFLUENZA VACCINE  04/01/2024  . Medicare Annual Wellness (AWV)  07/05/2024  . Pneumococcal Vaccine: 50+ Years  Completed  . DEXA SCAN  Completed  . Zoster Vaccines- Shingrix  Completed  . Hepatitis B Vaccines  Aged Out  . HPV VACCINES  Aged Out  . Meningococcal B Vaccine  Aged Out    Physical Exam: Vitals:   03/15/24  1323  BP: 122/62  Pulse: 75  Temp: 98 F (36.7 C)  SpO2: 97%  Weight: 105 lb 12.8 oz (48 kg)  Height: 5' 2 (1.575 m)   Body mass index is 19.35 kg/m. Physical Exam  Labs reviewed: Basic Metabolic Panel: Recent Labs    12/08/23 0000  NA 131*  K 4.1  CL 94*  CO2 24*  BUN 11  CREATININE 0.6  CALCIUM 94.0*  TSH 3.39   Liver Function Tests: Recent Labs    12/08/23 0000  AST 29  ALT 22  ALKPHOS 74  ALBUMIN  4.4   No results for input(s): LIPASE, AMYLASE in the last 8760 hours. No results for input(s): AMMONIA in the last 8760 hours. CBC: Recent Labs    12/08/23 1226  WBC 6.7  HGB 13.0  HCT 38  PLT 252   Lipid Panel: Recent Labs    12/08/23 0000  CHOL 188  HDL 62  LDLCALC 105  TRIG 895   Lab Results  Component Value Date   HGBA1C 5.4 03/02/2018    Procedures since last visit: No results found.  Assessment/Plan 1. Rhinitis, unspecified type (Primary) ***  2. Lower abdominal pain *** - CT ABDOMEN PELVIS W CONTRAST; Future - Ambulatory referral to Gastroenterology  3. S/P right colectomy *** - Ambulatory referral to Gastroenterology    Labs/tests ordered:  * No order type specified * Next appt:  06/07/2024

## 2024-03-16 ENCOUNTER — Other Ambulatory Visit: Payer: Self-pay | Admitting: Internal Medicine

## 2024-03-16 NOTE — Telephone Encounter (Signed)
 Allergic reaction warnings, is this okay to refill ?

## 2024-03-20 ENCOUNTER — Ambulatory Visit (HOSPITAL_BASED_OUTPATIENT_CLINIC_OR_DEPARTMENT_OTHER)
Admission: RE | Admit: 2024-03-20 | Discharge: 2024-03-20 | Disposition: A | Discharge status: Home / Self Care | Source: Ambulatory Visit | Attending: Internal Medicine | Admitting: Internal Medicine

## 2024-03-20 DIAGNOSIS — R1032 Left lower quadrant pain: Secondary | ICD-10-CM | POA: Diagnosis not present

## 2024-03-20 DIAGNOSIS — R1030 Lower abdominal pain, unspecified: Secondary | ICD-10-CM | POA: Diagnosis not present

## 2024-03-20 DIAGNOSIS — K5730 Diverticulosis of large intestine without perforation or abscess without bleeding: Secondary | ICD-10-CM | POA: Diagnosis not present

## 2024-03-20 DIAGNOSIS — N281 Cyst of kidney, acquired: Secondary | ICD-10-CM | POA: Diagnosis not present

## 2024-03-20 LAB — POCT I-STAT CREATININE: Creatinine, Ser: 0.6 mg/dL (ref 0.44–1.00)

## 2024-03-20 MED ORDER — IOHEXOL 300 MG/ML  SOLN
100.0000 mL | Freq: Once | INTRAMUSCULAR | Status: AC | PRN
  Administered 2024-03-20: 100 mL via INTRAVENOUS

## 2024-03-23 ENCOUNTER — Ambulatory Visit: Payer: Self-pay | Admitting: Internal Medicine

## 2024-03-31 ENCOUNTER — Encounter: Payer: Self-pay | Admitting: Gastroenterology

## 2024-04-22 ENCOUNTER — Ambulatory Visit: Admitting: Gastroenterology

## 2024-04-22 ENCOUNTER — Ambulatory Visit: Payer: Self-pay | Admitting: Gastroenterology

## 2024-04-22 ENCOUNTER — Other Ambulatory Visit (INDEPENDENT_AMBULATORY_CARE_PROVIDER_SITE_OTHER)

## 2024-04-22 ENCOUNTER — Encounter: Payer: Self-pay | Admitting: Gastroenterology

## 2024-04-22 VITALS — BP 118/58 | HR 84 | Ht 62.25 in | Wt 101.1 lb

## 2024-04-22 DIAGNOSIS — Z9049 Acquired absence of other specified parts of digestive tract: Secondary | ICD-10-CM

## 2024-04-22 DIAGNOSIS — R194 Change in bowel habit: Secondary | ICD-10-CM

## 2024-04-22 DIAGNOSIS — R140 Abdominal distension (gaseous): Secondary | ICD-10-CM

## 2024-04-22 LAB — CBC
HCT: 39.2 % (ref 36.0–46.0)
Hemoglobin: 13.1 g/dL (ref 12.0–15.0)
MCHC: 33.4 g/dL (ref 30.0–36.0)
MCV: 95.7 fl (ref 78.0–100.0)
Platelets: 240 K/uL (ref 150.0–400.0)
RBC: 4.1 Mil/uL (ref 3.87–5.11)
RDW: 13 % (ref 11.5–15.5)
WBC: 5.2 K/uL (ref 4.0–10.5)

## 2024-04-22 LAB — COMPREHENSIVE METABOLIC PANEL WITH GFR
ALT: 15 U/L (ref 0–35)
AST: 21 U/L (ref 0–37)
Albumin: 4.5 g/dL (ref 3.5–5.2)
Alkaline Phosphatase: 74 U/L (ref 39–117)
BUN: 13 mg/dL (ref 6–23)
CO2: 27 meq/L (ref 19–32)
Calcium: 9.3 mg/dL (ref 8.4–10.5)
Chloride: 98 meq/L (ref 96–112)
Creatinine, Ser: 0.55 mg/dL (ref 0.40–1.20)
GFR: 87.12 mL/min (ref >60.00)
Glucose, Bld: 102 mg/dL — ABNORMAL HIGH (ref 70–99)
Potassium: 3.7 meq/L (ref 3.5–5.1)
Sodium: 135 meq/L (ref 135–145)
Total Bilirubin: 0.6 mg/dL (ref 0.2–1.2)
Total Protein: 7.4 g/dL (ref 6.0–8.3)

## 2024-04-22 LAB — TSH: TSH: 2.82 u[IU]/mL (ref 0.35–5.50)

## 2024-04-22 MED ORDER — CHOLESTYRAMINE 4 G PO PACK: 4.0000 g | PACK | Freq: Every day | ORAL | 2 refills | Status: DC

## 2024-04-22 NOTE — Patient Instructions (Addendum)
 Your provider has requested that you go to the basement level for lab work before leaving today. Press B on the elevator. The lab is located at the first door on the left as you exit the elevator.  We have sent the following medications to your pharmacy for you to pick up at your convenience: Cholestyramine    If continued issues then will consider SIBO and Colonoscopy.   Follow-up on : 06/29/24 at 1:50 pm with Dr Wilhelmenia   Due to recent changes in healthcare laws, you may see the results of your imaging and laboratory studies on MyChart before your provider has had a chance to review them.  We understand that in some cases there may be results that are confusing or concerning to you. Not all laboratory results come back in the same time frame and the provider may be waiting for multiple results in order to interpret others.  Please give us  48 hours in order for your provider to thoroughly review all the results before contacting the office for clarification of your results.   _______________________________________________________  If your blood pressure at your visit was 140/90 or greater, please contact your primary care physician to follow up on this.  _______________________________________________________  If you are age 28 or older, your body mass index should be between 23-30. Your Body mass index is 18.35 kg/m. If this is out of the aforementioned range listed, please consider follow up with your Primary Care Provider.  If you are age 71 or younger, your body mass index should be between 19-25. Your Body mass index is 18.35 kg/m. If this is out of the aformentioned range listed, please consider follow up with your Primary Care Provider.   ________________________________________________________  The [facility] providers would like to encourage you to use MYCHART to communicate with providers for non-urgent requests or questions.  Due to long hold times on the telephone, sending  your provider a message by [facility] may be a faster and more efficient way to get a response.  Please allow 48 business hours for a response.  Please remember that this is for non-urgent requests.  _______________________________________________________  [facility] is using a team-based approach to care.  Your team is made up of your doctor and two to three APPS. Our APPS (Nurse Practitioners and Physician Assistants) work with your physician to ensure care continuity for you. They are fully qualified to address your health concerns and develop a treatment plan. They communicate directly with your gastroenterologist to care for you. Seeing the Advanced Practice Practitioners on your physician's team can help you by facilitating care more promptly, often allowing for earlier appointments, access to diagnostic testing, procedures, and other specialty referrals.   Thank you for choosing me and [facility].  Dr. Wilhelmenia

## 2024-04-22 NOTE — Progress Notes (Signed)
 GASTROENTEROLOGY OUTPATIENT CLINIC VISIT   Primary Care Provider Charlanne Fredia CROME, MD [address] [place] [address] [phone]  Referring Provider Charlanne Fredia CROME, MD [address] [place],  [place] [address] [phone]  Patient Profile: Deborah [name] is a 78 y.o. female with a pmh significant for Carotid Artery Disease, Thyroid  disease, HLD, HTN, s/p Rt hemicolectomy for Volvulus, Diverticulosis.   The patient presents to the [facility] for an evaluation and management of problem(s) noted below:  Problem List 1. Change in bowel habits   2. History of colectomy   3. Bloating     Discussed the use of AI scribe software for clinical note transcription with the patient, who gave verbal consent to proceed.  History of Present Illness Deborah [name] is a 78 year old female who presents with changes in bowel habits following a colon resection for volvulus in 2024.   Since the surgery, she has experienced changes in her bowel habits. Her stools are now 'finger size' in diameter and come out in little pieces, but they are not liquid.  She has tried increasing her fiber intake with Benefiber for the past month and a half to two months, but she has not noticed any improvement.  She experiences a sensation of heaviness and bloating, although she does not feel particularly gassy. She recalls having a large salad earlier this year, which resulted in bloating for two days. Her bowel habits prior to surgery were not problematic; she was not constipated and did not have daily bowel movements but did not consider it an issue. No blood in her stool and no significant weight changes over the past six months to a year.  She experienced nausea frequently four to two months ago, but it has not been a significant issue in the last two months. No current abdominal pain, although she had significant pain before the surgery.  No heartburn or reflux.  Her last colonoscopy  was over ten years ago, at which time no problems were noted. She had a CT scan (see below) recently, which did not show any overt masses or lesions.  She hopes to be able to feel better.   GI Review of Systems Positive as above Negative for dysphagia, odynophagia, melena, hematochezia  Review of Systems General: Denies fevers/chills/weight loss unintentionally Cardiovascular: Denies chest pain Pulmonary: Denies shortness of breath Gastroenterological: See HPI Genitourinary: Denies darkened urine Hematological: Denies easy bruising/bleeding Endocrine: Denies temperature intolerance Dermatological: Denies jaundice Psychological: Mood is stable  Medications Current Outpatient Medications  Medication Sig Dispense Refill   aspirin  81 MG tablet Take 81 mg by mouth daily.     Calcium Carb-Cholecalciferol (CALCIUM+D3 PO) Take 1 tablet by mouth daily.     Cholecalciferol (VITAMIN D-3) 1000 units CAPS Take 1,000 Units by mouth at bedtime.     cholestyramine  (QUESTRAN ) 4 g packet Take 1 packet (4 g total) by mouth daily. 30 packet 2   methimazole  (TAPAZOLE ) 5 MG tablet Take 1 tablet (5 mg total) by mouth 4 (four) times a week. 60 tablet 3   Multiple Vitamins-Calcium (ONE-A-DAY WOMENS FORMULA) TABS Take 1 tablet by mouth daily.     pravastatin  (PRAVACHOL ) 40 MG tablet TAKE 1 TABLET BY MOUTH EVERYDAY AT BEDTIME 90 tablet 1   No current facility-administered medications for this visit.    Allergies Allergies  Allergen Reactions   Atorvastatin Other (See Comments)    Muscle and joint aches   Simvastatin Other (See Comments)  Muscle and joint aches   Rosuvastatin Other (See Comments)    Muscle and joint aches    Histories Past Medical History:  Diagnosis Date   Arthritis    qwhere (02/26/2018)   Atrial septal aneurysm    Bowel obstruction (HCC)    Cancer (HCC)    chest (02/26/2018)   CHF (congestive heart failure) (HCC)    Coronary artery disease    Diverticulosis     Graves' disease    High cholesterol    Mild carotid artery disease (HCC)    Mild mitral regurgitation    Thyroid  disease    Past Surgical History:  Procedure Laterality Date   ANTERIOR CRUCIATE LIGAMENT REPAIR Left    BASAL CELL CARCINOMA EXCISION     chest (02/26/2018)   CARDIAC CATHETERIZATION     CARPAL TUNNEL RELEASE Right    CATARACT EXTRACTION W/ INTRAOCULAR LENS  IMPLANT, BILATERAL Bilateral    CORONARY ARTERY BYPASS GRAFT N/A 03/02/2018   Procedure: CORONARY ARTERY BYPASS GRAFTING (CABG);  Surgeon: Army Dallas NOVAK, MD;  Location: [facility] OR;  Service: Open Heart Surgery;  Laterality: N/A;  Times 3 using left internal mammary artery to LAD and endoscopically harvested left saphenous vein to Diagonal and PDA.   DILATION AND CURETTAGE OF UTERUS     LAPAROTOMY N/A 12/07/2022   Procedure: OPEN RIGHT COLECTOMY;  Surgeon: Lyndel Deward PARAS, MD;  Location: [facility] OR;  Service: General;  Laterality: N/A;   LEFT HEART CATH AND CORONARY ANGIOGRAPHY N/A 02/26/2018   Procedure: LEFT HEART CATH AND CORONARY ANGIOGRAPHY;  Surgeon: Anner Alm ORN, MD;  Location: [facility] INVASIVE CV LAB;  Service: Cardiovascular;  Laterality: N/A;   RIGHT OOPHORECTOMY Right 1970s   SHOULDER ARTHROSCOPY W/ ROTATOR CUFF REPAIR Bilateral    TEE WITHOUT CARDIOVERSION N/A 03/02/2018   Procedure: TRANSESOPHAGEAL ECHOCARDIOGRAM (TEE);  Surgeon: Army Dallas NOVAK, MD;  Location: [facility] OR;  Service: Open Heart Surgery;  Laterality: N/A;   TONSILLECTOMY     TRIGGER FINGER RELEASE Bilateral    8 of them   Social History   Socioeconomic History   Marital status: Widowed    Spouse name: John   Number of children: 0   Years of education: Not on file   Highest education level: Not on file  Occupational History   Not on file  Tobacco Use   Smoking status: Former    Current packs/day: 0.00    Average packs/day: 0.5 packs/day for 30.0 years (15.0 ttl pk-yrs)    Types: Cigarettes    Quit date: 52    Years since quitting: 26.6    Smokeless tobacco: Never   Tobacco comments:    02/26/2018 nothing since before 2000  Vaping Use   Vaping status: Never Used  Substance and Sexual Activity   Alcohol  use: Yes    Alcohol /week: 6.0 standard drinks of alcohol     Types: 6 Standard drinks or equivalent per week    Comment: 1 per day   Drug use: Never   Sexual activity: Not Currently  Other Topics Concern   Not on file  Social History Narrative   Not on file   Social Drivers of Health   Financial Resource Strain: Low Risk  (05/04/2018)   Overall Financial Resource Strain (CARDIA)    Difficulty of Paying Living Expenses: Not hard at all  Food Insecurity: No Food Insecurity (12/07/2022)   Hunger Vital Sign    Worried About Running Out of Food in the Last Year: Never true  Ran Out of Food in the Last Year: Never true  Transportation Needs: No Transportation Needs (12/07/2022)   PRAPARE - Administrator, Civil Service (Medical): No    Lack of Transportation (Non-Medical): No  Physical Activity: Sufficiently Active (05/04/2018)   Exercise Vital Sign    Days of Exercise per Week: 7 days    Minutes of Exercise per Session: 30 min  Stress: Stress Concern Present (05/04/2018)   [employer] of Occupational Health - Occupational Stress Questionnaire    Feeling of Stress : To some extent  Social Connections: Not on file  Intimate Partner Violence: Not At Risk (12/07/2022)   Humiliation, Afraid, Rape, and Kick questionnaire    Fear of Current or Ex-Partner: No    Emotionally Abused: No    Physically Abused: No    Sexually Abused: No   Family History  Problem Relation Age of Onset   Osteoporosis Mother    CAD Father        1st Mi around age 46   Hypercholesterolemia Father    Hypercholesterolemia Sister    Multiple myeloma Brother    Osteoporosis Maternal Grandmother    Heart disease Paternal Grandmother    Colon cancer Neg Hx    Esophageal cancer Neg Hx    Inflammatory bowel disease Neg Hx    Liver  disease Neg Hx    Pancreatic cancer Neg Hx    Rectal cancer Neg Hx    Stomach cancer Neg Hx    I have reviewed her medical, social, and family history in detail and updated the electronic medical record as necessary.    PHYSICAL EXAMINATION  BP (!) 118/58 (BP Location: Left Arm, Patient Position: Sitting, Cuff Size: Normal)   Pulse 84   Ht 5' 2.25 (1.581 m) Comment: height measured without shoes  Wt 101 lb 2 oz (45.9 kg)   BMI 18.35 kg/m  Wt Readings from Last 3 Encounters:  04/22/24 101 lb 2 oz (45.9 kg)  03/15/24 105 lb 12.8 oz (48 kg)  12/15/23 102 lb 3.2 oz (46.4 kg)  GEN: NAD, appears stated age, doesn't appear chronically ill PSYCH: Cooperative, without pressured speech EYE: Conjunctivae pink, sclerae anicteric ENT: MMM CV: Nontachycardic RESP: No audible wheezing GI: NABS, soft, NT/ND, without rebound or guarding MSK/EXT: No significant lower extremity edema SKIN: No jaundice, NEURO:  Alert & Oriented x 3, no focal deficits   REVIEW OF DATA  I reviewed the following data at the time of this encounter:  GI Procedures and Studies  Reports Colonoscopy >10 years ago  Laboratory Studies  Reviewed in [facility]  Imaging Studies  7/25 CTAP IMPRESSION: Sigmoid diverticulosis, without radiographic evidence of diverticulitis or other acute findings.  4/25 CTAP IMPRESSION: 1. Severe small-bowel obstruction, as above, the appearance of which suggests probable internal hernia. The largest dilated loop of small bowel measures up to 8.1 cm in diameter. Emergent surgical consultation is strongly recommended. 2. Colonic diverticulosis without evidence of acute diverticulitis at this time. 3. Small hiatal hernia. 4. Aortic atherosclerosis, in addition to three-vessel coronary artery disease. Assessment for potential risk factor modification, dietary therapy or pharmacologic therapy may be warranted, if clinically indicated. 5. Small indeterminate lesion in the upper pole of  the left kidney measuring 1.2 cm in diameter. Follow-up outpatient nonemergent abdominal MRI with and without IV gadolinium is recommended in the near future after resolution of the patient's acute illness to characterize this lesion. 6. Additional incidental findings, as above.   ASSESSMENT  Ms.  Dresner is a 78 y.o. female.  The patient is seen today for evaluation and management of:  1. Change in bowel habits   2. History of colectomy   3. Bloating    Assessment and Plan The patient is clinically and hemodynamically stable.  Altered bowel habits following right hemicolectomy for volvulus Differential diagnosis includes bile salt malabsorption due to absence of the ileocecal valve and small intestine bacterial overgrowth.  Discussed bile salts' role and potential small intestine bacterial overgrowth. Colonoscopy considered if symptoms persist, given the long interval since last colonoscopy and recent CT scan showing no overt masses or blockages. She prefers a conservative management approach.  All patient questions were answered to the best of my ability, and the patient agrees to the aforementioned plan of action with follow-up as indicated.   PLAN  - Prescribe cholestyramine  packets to bind bile salts and improve bowel habits - Provide FODMAP chart for dietary modifications to reduce bloating. - Order blood tests to evaluate for anemia, celiac disease, and thyroid  function. - Schedule follow-up appointment in 2-2.5 months to reassess symptoms and treatment efficacy. - Consider colonoscopy if symptoms persist despite current management. - Discuss breath test for small intestine bacterial overgrowth if symptoms of gas and bloating worsen.  - Laboratories as outlined below   Orders Placed This Encounter  Procedures   CBC   Comp Met (CMET)   TSH   IgA   Tissue transglutaminase, IgA    New Prescriptions   CHOLESTYRAMINE  (QUESTRAN ) 4 G PACKET    Take 1 packet (4 g total) by mouth  daily.   Modified Medications   No medications on file    Planned Follow Up No follow-ups on file.   Total Time in Face-to-Face and in Coordination of Care for patient including independent/personal interpretation/review of prior testing, medical history, examination, medication adjustment, communicating results with the patient directly, and documentation within the EHR is 45 minutes.   Aloha Finner, MD [facility] Office # [phone]

## 2024-04-23 ENCOUNTER — Encounter: Payer: Self-pay | Admitting: Gastroenterology

## 2024-04-23 LAB — TISSUE TRANSGLUTAMINASE, IGA: (tTG) Ab, IgA: <1 U/mL

## 2024-04-23 LAB — IGA: Immunoglobulin A: 314 mg/dL (ref 70–320)

## 2024-05-06 ENCOUNTER — Ambulatory Visit (HOSPITAL_BASED_OUTPATIENT_CLINIC_OR_DEPARTMENT_OTHER): Admitting: Cardiology

## 2024-05-26 ENCOUNTER — Encounter (HOSPITAL_BASED_OUTPATIENT_CLINIC_OR_DEPARTMENT_OTHER): Payer: Self-pay | Admitting: Cardiology

## 2024-05-26 ENCOUNTER — Ambulatory Visit (INDEPENDENT_AMBULATORY_CARE_PROVIDER_SITE_OTHER): Admitting: Cardiology

## 2024-05-26 VITALS — BP 112/66 | HR 77 | Resp 17 | Ht 62.0 in | Wt 99.0 lb

## 2024-05-26 DIAGNOSIS — I2510 Atherosclerotic heart disease of native coronary artery without angina pectoris: Secondary | ICD-10-CM

## 2024-05-26 DIAGNOSIS — T466X5A Adverse effect of antihyperlipidemic and antiarteriosclerotic drugs, initial encounter: Secondary | ICD-10-CM

## 2024-05-26 DIAGNOSIS — E7800 Pure hypercholesterolemia, unspecified: Secondary | ICD-10-CM | POA: Diagnosis not present

## 2024-05-26 DIAGNOSIS — T466X5D Adverse effect of antihyperlipidemic and antiarteriosclerotic drugs, subsequent encounter: Secondary | ICD-10-CM | POA: Diagnosis not present

## 2024-05-26 DIAGNOSIS — Z951 Presence of aortocoronary bypass graft: Secondary | ICD-10-CM

## 2024-05-26 DIAGNOSIS — G720 Drug-induced myopathy: Secondary | ICD-10-CM | POA: Diagnosis not present

## 2024-05-26 NOTE — Patient Instructions (Signed)

## 2024-05-26 NOTE — Progress Notes (Signed)
  Cardiology Office Note:  .    Date:  05/26/2024  ID:  Deborah [name], DOB [birth year], MRN [id number] PCP: Charlanne Fredia CROME, MD  [facility] Providers Cardiologist:  Shelda Bruckner, MD     History of Present Illness: .    Deborah [name] is a 78 y.o. female with a hx of CAD with NSTEMI 01/2018 s/p CABG (LIMA-LAD, SVG-Dx, SVG-PDA), atrial septal aneurysm, mild carotid artery disease by duplex 2019, hyperlipidemia. I met her 04/03/23.   CV testing: echocardiogram 09/2022 showed LVEF 60-65%, normal diastolic parameters, mild to moderate mitral valve regurgitation, mild aortic regurgitation, borderline dilatation of the ascending aorta, measuring 39 mm. Carotid dopplers 09/2022 revealed non-hemodynamically significant plaque <50% in the left CCA, and minimal wall thickening or plaque bilaterally.   CV history: CAD with NSTEMI 01/2018 s/p CABG (LIMA-LAD, SVG-Dx, SVG-PDA). Hypertension, but stopped Lopressor  due to low blood pressures. Hyperlipidemia - On pravastatin ; stopped her praluent. Intolerant of higher intensity statins in the past. LDL 105 in 01/2023.   Today: Overall doing well. Has had a slow recovery after bowel surgery last year. Went to see Dr. Wilhelmenia at GI, was recommended to start cholestyramine , but when she read the side effects, she was concerned that this might worsen her constipation, so she did not start. Bowel pattern is now improving so she is not planning to start medication at this time.  No cardiac concerns. Able to walk 2-2.5 miles/day without issues.   ROS:  Denies chest pain, shortness of breath at rest or with normal exertion. No PND, orthopnea, LE edema or unexpected weight gain. No syncope or palpitations. ROS otherwise negative except as noted.   Studies Reviewed: SABRA    EKG Interpretation Date/Time:  Thursday May 26 2024 08:15:01 EDT Ventricular Rate:  77 PR Interval:  150 QRS Duration:  74 QT Interval:  378 QTC Calculation: 427 R  Axis:   82  Text Interpretation: Normal sinus rhythm with sinus arrhythmia Nonspecific ST abnormality Confirmed by Bruckner Shelda [phone]) on 05/26/2024 8:37:34 AM      Physical Exam:    VS:  BP 112/66   Pulse 77   Resp 17   Ht 5' 2 (1.575 m)   Wt 99 lb (44.9 kg)   SpO2 94%   BMI 18.11 kg/m    Wt Readings from Last 3 Encounters:  05/26/24 99 lb (44.9 kg)  04/22/24 101 lb 2 oz (45.9 kg)  03/15/24 105 lb 12.8 oz (48 kg)    GEN: Well nourished, well developed in no acute distress HEENT: Normal, moist mucous membranes NECK: No JVD CARDIAC: regular rhythm, normal S1 and S2, no rubs or gallops. 1/6 soft murmur. VASCULAR: Radial and DP pulses 2+ bilaterally. No carotid bruits RESPIRATORY:  Clear to auscultation without rales, wheezing or rhonchi  ABDOMEN: Soft, non-tender, non-distended MUSCULOSKELETAL:  Ambulates independently SKIN: Warm and dry, no edema, varicosities of bilateral lower extremities. NEUROLOGIC:  Alert and oriented x 3. No focal neuro deficits noted. PSYCHIATRIC:  Normal affect   ASSESSMENT AND PLAN: .    CAD History of CABG Pure hypercholesterolemia History of statin myopathy -continue aspirin , pravastatin  -LDL 105, goal <70 -previously LDL was 55 on praluent -we discussed again the goal for LDL and options for management. After shared decision making, she will continue on the current medication -she has excellent diet/exercise -reviewed red flag signs that need immediate medical attention  Dispo: Follow-up in 1 year, or sooner as needed.  Signed, Shelda Bruckner, MD

## 2024-06-02 DIAGNOSIS — Z23 Encounter for immunization: Secondary | ICD-10-CM | POA: Diagnosis not present

## 2024-06-07 ENCOUNTER — Non-Acute Institutional Stay: Admitting: Internal Medicine

## 2024-06-07 ENCOUNTER — Encounter: Payer: Self-pay | Admitting: Internal Medicine

## 2024-06-07 VITALS — BP 122/78 | HR 72 | Temp 98.6°F | Ht 62.0 in | Wt 100.0 lb

## 2024-06-07 DIAGNOSIS — Z9049 Acquired absence of other specified parts of digestive tract: Secondary | ICD-10-CM | POA: Diagnosis not present

## 2024-06-07 DIAGNOSIS — E0590 Thyrotoxicosis, unspecified without thyrotoxic crisis or storm: Secondary | ICD-10-CM | POA: Diagnosis not present

## 2024-06-07 DIAGNOSIS — E782 Mixed hyperlipidemia: Secondary | ICD-10-CM

## 2024-06-09 NOTE — Progress Notes (Signed)
 Location:  [facility] features editor of Service:  Clinic (12)  Provider:   Code Status:  Goals of Care:     07/06/2023   12:39 PM  Advanced Directives  Does Patient Have a Medical Advance Directive? Yes  Type of Estate agent of [place];Living will  Does patient want to make changes to medical advance directive? No - Patient declined  Copy of Healthcare Power of Attorney in Chart? No - copy requested     Chief Complaint  Patient presents with   Follow-up    HPI: Patient is a 78 y.o. female seen today for medical management of chronic diseases.   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  S/p right colectomy Followed by Wound dehiscence Has Healed well   HLD on Statin Does have h/o CAD  Discussed the use of AI scribe software for clinical note transcription with the patient, who gave verbal consent to proceed.  History of Present Illness   Deborah [name] is a 78 year old female who presents for follow-up on bowel function and dietary management.  Bowel movements have improved significantly since the last visit. Previously, she experienced daily bloating and a heavy feeling that lasted most of the day, preventing her from planning morning activities. These symptoms have resolved, and she no longer experiences such discomfort.  She avoids uncooked vegetables, suspecting they contributed to her previous bloating. She has not been taking Questran  due to concerns about constipation and potential interactions with other medications. She is currently on pravastatin  for cholesterol management.       Past Medical History:  Diagnosis Date   Arthritis    qwhere (02/26/2018)   Atrial septal aneurysm    Bowel obstruction (HCC)    Cancer  (HCC)    chest (02/26/2018)   CHF (congestive heart failure) (HCC)    Coronary artery disease    Diverticulosis    Graves' disease    High cholesterol    Mild carotid artery disease    Mild mitral regurgitation    Thyroid  disease     Past Surgical History:  Procedure Laterality Date   ANTERIOR CRUCIATE LIGAMENT REPAIR Left    BASAL CELL CARCINOMA EXCISION     chest (02/26/2018)   CARDIAC CATHETERIZATION     CARPAL TUNNEL RELEASE Right    CATARACT EXTRACTION W/ INTRAOCULAR LENS  IMPLANT, BILATERAL Bilateral    CORONARY ARTERY BYPASS GRAFT N/A 03/02/2018   Procedure: CORONARY ARTERY BYPASS GRAFTING (CABG);  Surgeon: Army Dallas NOVAK, MD;  Location: [facility] OR;  Service: Open Heart Surgery;  Laterality: N/A;  Times 3 using left internal mammary artery to LAD and endoscopically harvested left saphenous vein to Diagonal and PDA.   DILATION AND CURETTAGE OF UTERUS     LAPAROTOMY N/A 12/07/2022   Procedure: OPEN RIGHT COLECTOMY;  Surgeon: Lyndel Deward PARAS, MD;  Location: [facility] OR;  Service: General;  Laterality: N/A;   LEFT HEART CATH AND CORONARY ANGIOGRAPHY N/A 02/26/2018   Procedure: LEFT HEART CATH AND CORONARY ANGIOGRAPHY;  Surgeon: Anner Alm ORN, MD;  Location[facility] INVASIVE CV LAB;  Service: Cardiovascular;  Laterality: N/A;   RIGHT OOPHORECTOMY Right 1970s   SHOULDER ARTHROSCOPY W/ ROTATOR CUFF REPAIR Bilateral    TEE WITHOUT CARDIOVERSION N/A 03/02/2018   Procedure:  TRANSESOPHAGEAL ECHOCARDIOGRAM (TEE);  Surgeon: Army Dallas NOVAK, MD;  Location: [facility] OR;  Service: Open Heart Surgery;  Laterality: N/A;   TONSILLECTOMY     TRIGGER FINGER RELEASE Bilateral    8 of them    Allergies  Allergen Reactions   Atorvastatin Other (See Comments)    Muscle and joint aches   Simvastatin Other (See Comments)    Muscle and joint aches   Rosuvastatin Other (See Comments)    Muscle and joint aches    Outpatient Encounter Medications as of 06/07/2024  Medication Sig   aspirin  81 MG tablet  Take 81 mg by mouth daily.   Calcium Carb-Cholecalciferol (CALCIUM+D3 PO) Take 1 tablet by mouth daily.   Cholecalciferol (VITAMIN D-3) 1000 units CAPS Take 1,000 Units by mouth at bedtime.   methimazole  (TAPAZOLE ) 5 MG tablet Take 1 tablet (5 mg total) by mouth 4 (four) times a week.   Multiple Vitamins-Calcium (ONE-A-DAY WOMENS FORMULA) TABS Take 1 tablet by mouth daily.   pravastatin  (PRAVACHOL ) 40 MG tablet TAKE 1 TABLET BY MOUTH EVERYDAY AT BEDTIME   cholestyramine  (QUESTRAN ) 4 g packet Take 1 packet (4 g total) by mouth daily. (Patient not taking: Reported on 06/07/2024)   No facility-administered encounter medications on file as of 06/07/2024.    Review of Systems:  Review of Systems  Constitutional:  Negative for activity change and appetite change.  HENT: Negative.    Respiratory:  Negative for cough and shortness of breath.   Cardiovascular:  Negative for leg swelling.  Gastrointestinal:  Negative for constipation.  Genitourinary: Negative.   Musculoskeletal:  Negative for arthralgias, gait problem and myalgias.  Skin: Negative.   Neurological:  Negative for dizziness and weakness.  Psychiatric/Behavioral:  Negative for confusion, dysphoric mood and sleep disturbance.     Health Maintenance  Topic Date Due   Medicare Annual Wellness (AWV)  07/05/2024   COVID-19 Vaccine (5 - 2025-26 season) 06/23/2024 (Originally 05/02/2024)   DTaP/Tdap/Td (5 - Td or Tdap) 07/01/2024 (Originally 12/30/2023)   Hepatitis C Screening  07/05/2024 (Originally 10/12/1963)   Pneumococcal Vaccine: 50+ Years  Completed   Influenza Vaccine  Completed   DEXA SCAN  Completed   Zoster Vaccines- Shingrix  Completed   Meningococcal B Vaccine  Aged Out    Physical Exam: Vitals:   06/07/24 0919  BP: 122/78  Pulse: 72  Temp: 98.6 F (37 C)  SpO2: 99%  Weight: 100 lb (45.4 kg)  Height: 5' 2 (1.575 m)   Body mass index is 18.29 kg/m. Physical Exam Vitals reviewed.  Constitutional:       Appearance: Normal appearance.  HENT:     Head: Normocephalic.     Nose: Nose normal.     Mouth/Throat:     Mouth: Mucous membranes are moist.     Pharynx: Oropharynx is clear.  Eyes:     Pupils: Pupils are equal, round, and reactive to light.  Cardiovascular:     Rate and Rhythm: Normal rate and regular rhythm.     Pulses: Normal pulses.     Heart sounds: Normal heart sounds. No murmur heard. Pulmonary:     Effort: Pulmonary effort is normal.     Breath sounds: Normal breath sounds.  Abdominal:     General: Abdomen is flat. Bowel sounds are normal.     Palpations: Abdomen is soft.  Musculoskeletal:        General: No swelling.     Cervical back: Neck supple.  Skin:    General:  Skin is warm.  Neurological:     General: No focal deficit present.     Mental Status: She is alert and oriented to person, place, and time.  Psychiatric:        Mood and Affect: Mood normal.        Thought Content: Thought content normal.     Labs reviewed: Basic Metabolic Panel: Recent Labs    12/08/23 0000 03/20/24 1406 04/22/24 0905  NA 131*  --  135  K 4.1  --  3.7  CL 94*  --  98  CO2 24*  --  27  GLUCOSE  --   --  102*  BUN 11  --  13  CREATININE 0.6 0.60 0.55  CALCIUM 94.0*  --  9.3  TSH 3.39  --  2.82   Liver Function Tests: Recent Labs    12/08/23 0000 04/22/24 0905  AST 29 21  ALT 22 15  ALKPHOS 74 74  BILITOT  --  0.6  PROT  --  7.4  ALBUMIN  4.4 4.5   No results for input(s): LIPASE, AMYLASE in the last 8760 hours. No results for input(s): AMMONIA in the last 8760 hours. CBC: Recent Labs    12/08/23 1226 04/22/24 0905  WBC 6.7 5.2  HGB 13.0 13.1  HCT 38 39.2  MCV  --  95.7  PLT 252 240.0   Lipid Panel: Recent Labs    12/08/23 0000  CHOL 188  HDL 62  LDLCALC 105  TRIG 895   Lab Results  Component Value Date   HGBA1C 5.4 03/02/2018    Procedures since last visit: No results found.  Assessment/Plan 1. Mixed hyperlipidemia (Primary) On  Statin Cannot tolerate higher dose   2. Hyperthyroidism On Methimazole  TSH  normal  3. S/P right colectomy CT negative Seen GI 4 Coronary artery disease S/P CABG Follows with Dr Carola Statin and Aspirin    Labs/tests ordered:   Next appt:  10/18/2024

## 2024-06-29 ENCOUNTER — Ambulatory Visit: Admitting: Gastroenterology

## 2024-06-29 DIAGNOSIS — Z1231 Encounter for screening mammogram for malignant neoplasm of breast: Secondary | ICD-10-CM | POA: Diagnosis not present

## 2024-07-22 ENCOUNTER — Other Ambulatory Visit: Payer: Self-pay | Admitting: Gastroenterology

## 2024-09-11 ENCOUNTER — Other Ambulatory Visit: Payer: Self-pay | Admitting: Internal Medicine

## 2024-09-12 NOTE — Telephone Encounter (Signed)
 High risk or very high risk warning populated when attempting to refill medication. RX request sent to PCP for review and approval if warranted.

## 2024-10-18 ENCOUNTER — Encounter: Payer: Self-pay | Admitting: Internal Medicine
